# Patient Record
Sex: Female | Born: 1960 | Race: White | Hispanic: No | Marital: Married | Smoking: Never smoker
Health system: Southern US, Community
[De-identification: ages and names within clinical notes are randomized; demographics above are authoritative.]

## PROBLEM LIST (undated history)

## (undated) DIAGNOSIS — F329 Major depressive disorder, single episode, unspecified: Secondary | ICD-10-CM

## (undated) DIAGNOSIS — Z87442 Personal history of urinary calculi: Secondary | ICD-10-CM

## (undated) DIAGNOSIS — C801 Malignant (primary) neoplasm, unspecified: Secondary | ICD-10-CM

## (undated) DIAGNOSIS — F419 Anxiety disorder, unspecified: Secondary | ICD-10-CM

## (undated) DIAGNOSIS — R87619 Unspecified abnormal cytological findings in specimens from cervix uteri: Secondary | ICD-10-CM

## (undated) DIAGNOSIS — F32A Depression, unspecified: Secondary | ICD-10-CM

## (undated) HISTORY — DX: Major depressive disorder, single episode, unspecified: F32.9

## (undated) HISTORY — DX: Depression, unspecified: F32.A

## (undated) HISTORY — DX: Personal history of urinary calculi: Z87.442

## (undated) HISTORY — DX: Unspecified abnormal cytological findings in specimens from cervix uteri: R87.619

## (undated) HISTORY — DX: Anxiety disorder, unspecified: F41.9

---

## 2004-11-15 ENCOUNTER — Ambulatory Visit: Payer: Self-pay | Admitting: Family Medicine

## 2006-04-14 ENCOUNTER — Emergency Department: Payer: Self-pay | Admitting: Emergency Medicine

## 2006-04-15 ENCOUNTER — Ambulatory Visit: Payer: Self-pay | Admitting: Emergency Medicine

## 2006-07-23 ENCOUNTER — Ambulatory Visit: Payer: Self-pay | Admitting: Family Medicine

## 2006-08-27 ENCOUNTER — Ambulatory Visit: Payer: Self-pay | Admitting: Gastroenterology

## 2006-09-26 ENCOUNTER — Ambulatory Visit: Payer: Self-pay | Admitting: Gastroenterology

## 2006-10-13 ENCOUNTER — Emergency Department: Payer: Self-pay

## 2006-10-22 ENCOUNTER — Ambulatory Visit: Payer: Self-pay | Admitting: Gastroenterology

## 2007-06-17 ENCOUNTER — Ambulatory Visit: Payer: Self-pay

## 2007-06-30 ENCOUNTER — Ambulatory Visit: Payer: Self-pay | Admitting: Ophthalmology

## 2008-01-20 ENCOUNTER — Ambulatory Visit: Payer: Self-pay

## 2008-05-18 ENCOUNTER — Ambulatory Visit: Payer: Self-pay | Admitting: Unknown Physician Specialty

## 2008-05-31 ENCOUNTER — Ambulatory Visit: Payer: Self-pay | Admitting: Unknown Physician Specialty

## 2012-09-23 HISTORY — PX: BASAL CELL CARCINOMA EXCISION: SHX1214

## 2012-09-23 HISTORY — PX: SQUAMOUS CELL CARCINOMA EXCISION: SHX2433

## 2013-08-16 ENCOUNTER — Ambulatory Visit: Payer: Self-pay | Admitting: Gastroenterology

## 2013-08-16 LAB — HM COLONOSCOPY

## 2013-09-23 HISTORY — PX: MOHS SURGERY: SHX181

## 2013-09-29 ENCOUNTER — Ambulatory Visit: Payer: Self-pay | Admitting: Family Medicine

## 2014-03-03 DIAGNOSIS — E669 Obesity, unspecified: Secondary | ICD-10-CM | POA: Insufficient documentation

## 2014-03-03 DIAGNOSIS — G479 Sleep disorder, unspecified: Secondary | ICD-10-CM | POA: Insufficient documentation

## 2014-03-03 DIAGNOSIS — Z6835 Body mass index (BMI) 35.0-35.9, adult: Secondary | ICD-10-CM | POA: Insufficient documentation

## 2014-09-01 ENCOUNTER — Ambulatory Visit: Payer: Self-pay | Admitting: Sports Medicine

## 2014-09-30 LAB — TSH: TSH: 1.2 u[IU]/mL (ref 0.41–5.90)

## 2014-09-30 LAB — CBC AND DIFFERENTIAL
HCT: 40 % (ref 36–46)
HEMOGLOBIN: 13.5 g/dL (ref 12.0–16.0)
PLATELETS: 348 10*3/uL (ref 150–399)
WBC: 9.3 10^3/mL

## 2014-09-30 LAB — HEPATIC FUNCTION PANEL
ALT: 11 U/L (ref 7–35)
AST: 18 U/L (ref 13–35)

## 2014-09-30 LAB — LIPID PANEL
CHOLESTEROL: 205 mg/dL — AB (ref 0–200)
HDL: 53 mg/dL (ref 35–70)
LDL Cholesterol: 132 mg/dL
Triglycerides: 100 mg/dL (ref 40–160)

## 2014-09-30 LAB — BASIC METABOLIC PANEL
Creatinine: 0.7 mg/dL (ref 0.5–1.1)
Glucose: 80 mg/dL
POTASSIUM: 4.4 mmol/L (ref 3.4–5.3)
Sodium: 139 mmol/L (ref 137–147)

## 2014-10-12 ENCOUNTER — Ambulatory Visit (INDEPENDENT_AMBULATORY_CARE_PROVIDER_SITE_OTHER): Payer: No Typology Code available for payment source | Admitting: Sports Medicine

## 2014-10-12 ENCOUNTER — Encounter: Payer: Self-pay | Admitting: Sports Medicine

## 2014-10-12 VITALS — BP 106/62 | HR 93 | Ht 65.0 in | Wt 207.0 lb

## 2014-10-12 DIAGNOSIS — M7661 Achilles tendinitis, right leg: Secondary | ICD-10-CM | POA: Insufficient documentation

## 2014-10-12 DIAGNOSIS — M67879 Other specified disorders of synovium and tendon, unspecified ankle and foot: Secondary | ICD-10-CM

## 2014-10-12 DIAGNOSIS — M7662 Achilles tendinitis, left leg: Secondary | ICD-10-CM

## 2014-10-12 NOTE — Assessment & Plan Note (Addendum)
Ultrasound findings consistent with chronic moderate bilateral achilles tendinopathy. The degree of thickening at the nodules is 70 enlarged over normal Max diameter.  Recommendations: 1) Continue meloxicam/tramadol as needed for pain.  2) Heel raise exercises: Raise both feet up, then lower one foot down at a time. Perform exercises with legs straight then with knees bent.  - Start with 3 sets of 8 reps (8 reps for left calf, 8 reps for right calf)  - After 1 week increase to 3 sets of 10 reps.   - After 2 weeks increase to 3 sets of 12 reps  - After 3 weeks increase to 3 sets of 15 reps.   - Pain should never be severe - it should only be mild.  3) Body Helix for R ankle during activity / standing. Will assess benefit at follow up and consider body helix for L ankle.  4) Wear heel lifts in your shoes daily.  5) Rub arnica gel on your achilles 2 - 3x daily.  6) Return to clinic in 6 weeks. Talk to Dr. Noemi Chapel about whether he wants to follow up with you of would like to see results of conservative approach.

## 2014-10-12 NOTE — Progress Notes (Signed)
Subjective:    Patient ID: Krystal Simpson, female    DOB: [birth year], 54 y.o.   MRN: [id number]  HPI Krystal Simpson is a 54 year old with PMH of depression/anxiety who presents today as a referral from Dr. Noemi Chapel for evaluation/treatment of chronic bilateral achilles pain. Krystal Simpson's achilles pain began after receiving ciprofloxacin for an uncomplicated UTI a year and half ago. Soon after finishing her week course of cipro, she began to develop pain in both of her achilles (L>R). She went to her PCP who referred to her to an orthopaedic (Dr. [name] Heck in [place]). Dr. [name] Heck put her in a walking boot on her left foot for 6 - 9 weeks followed by an ankle brace. Once in the ankle brace, she began participating in physical therapy without much relief. She was then referred to an orthopaedic in [place] (Dr. Diona Browner, a foot/ankle specialist, at [facility]) who performed an MRI and recommended surgery for both achilles. [name] wanted to avoid surgery and was finally referred to Dr. Noemi Chapel. Dr. Noemi Chapel started Krystal Simpson on tramadol and a short course of prednisone. Krystal Simpson believes the prednisone helped reduce the pain slightly in her achilles. Tramadol helps relieve her pain.  Krystal Simpson currently takes Meloxicam daily and tramadol at night for her achilles pain. She reports some relief with these medications but not enough to allow her to walk with comfort for ADLs. Her achilles pain is currently worse in her R, although initially the pain was concentrated in her L achilles. The pain is a sharp throbbing pain that is constant in both ankles/heels. She reports lifting her toes up and stretching her achilles provide temporary relief. She was given heel lifts for her shoes that she wears most days and they help.  She denies any numbness or tingling into her feet. She denies any trauma or injuries to her feet. She desires to be able to walk her dog without discomfort and return to  working.  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   Review of Systems     Objective:   Physical Exam  Obese. Well developed. Pleasant middle aged woman in no acute distress.  BP 106/62 mmHg  Pulse 93  Ht 5\' 5"  (1.651 m)  Wt 207 lb (93.895 kg)  BMI 34.45 kg/m2  Right ankle: Nodule present on back of achilles. Nodule starts about 3 cms above calcaneus. Achilles tendon tender to palpation diffusely. Full ankle ROM. 5/5 strength with ankle inversion, eversion, dorsalflexion and plantarflexion.   Left ankle: Nodule present on back of achilles. Starts 4 cm above calcaneus.  Achilles tendon tender to palpation (less tender than her R tendon). Full ankle ROM. 5/5 strength with ankle inversion, eversion, dorsalflexion and plantarflexion.   Feet: Splaying of great toe from second toe bilaterally. Splaying of 2nd and 3rd toes present on left foot. Mild loss of longitudinal arch bilaterally.  Mild pronation with gait.     Ultrasound: Findings consistent with moderate bilateral chronic achilles tendiopathy. Right achilles thickness  At insertion is 0.42 and thickened to 0.97 at mid portion of nodule.  On transverse scan there is some hypoechoic disruption but no tear noted.  . Left achilles thickness  At insertion is 0.44 and at midpoint of nodule is 0.93.  Transverse scan shows mild hypoechoic change mid tendon.  Neither tendon shows neovessels and neither shows a tear at this point.        Assessment &  Plan:   Note written originally by [place]. Note reviewed/edited by Dr. Stefanie Libel.

## 2014-10-12 NOTE — Patient Instructions (Signed)
Heel raises on both feet simultaneously then lower down one foot at a time start with 3 sets of 8 reps initially (8 for left, 8 for right for each set). After 1 week increase to 3 sets of 10 reps. After 2 weeks increase to 3 sets of 12 reps. After 3 weeks increase to 3 sets of 15.   Perform the above exercises with legs initially straight then with knees bent.   Please keep pain mild during exercises. If pain is more severe, cut down on amount of exercise.   Wear compression sleeve on R ankle during activity (walking) / standing. If you have benefits with the right ankle we can get you a sleeve for your left ankle. You do not need to wear the compression sleeve while sleeping or sitting around.   Wear your heel lifts in your shoes daily.    Rub arnica gel on your achilles 2 - 3x daily.    Return to clinic in 6 weeks.

## 2014-10-18 ENCOUNTER — Ambulatory Visit: Payer: Self-pay | Admitting: Family Medicine

## 2014-10-26 ENCOUNTER — Ambulatory Visit: Payer: Self-pay | Admitting: Family Medicine

## 2014-11-23 ENCOUNTER — Ambulatory Visit (INDEPENDENT_AMBULATORY_CARE_PROVIDER_SITE_OTHER): Payer: No Typology Code available for payment source | Admitting: Sports Medicine

## 2014-11-23 ENCOUNTER — Encounter: Payer: Self-pay | Admitting: Sports Medicine

## 2014-11-23 VITALS — BP 105/66 | HR 101 | Ht 65.0 in | Wt 207.0 lb

## 2014-11-23 DIAGNOSIS — M7662 Achilles tendinitis, left leg: Secondary | ICD-10-CM

## 2014-11-23 DIAGNOSIS — M7661 Achilles tendinitis, right leg: Secondary | ICD-10-CM

## 2014-11-23 NOTE — Patient Instructions (Signed)
-  Discontinue gabapentin -Discontinue Arnica gel -Start Aspercreme 2-3 times daily to both Achilles tendons -Body helix compression sleeve fitted for the other ankle -Heel lifts with both feet only with step only at about book height, as many repeats as he can do while keeping her pain below a 3. To this 2-3 times daily. -We will check it back in 6-8 weeks or sooner if needed.

## 2014-11-23 NOTE — Assessment & Plan Note (Signed)
-  Discontinue gabapentin -Discontinue Arnica gel -Start Aspercreme 2-3 times daily to both Achilles tendons -Body helix compression sleeve fitted for the left ankle -Heel lifts with both feet only with step only at about book height, as many repeats as he can do while keeping her pain below a 3. Do this 2-3 times daily. -If having significant pain, then patient said she will contact her PCP. If she calls and needs a medication, a short-course of Vicodin would not be unreasonable to provide. -We will check it back in 6-8 weeks or sooner if needed.

## 2014-11-23 NOTE — Progress Notes (Signed)
Subjective:    Patient ID: Krystal Simpson, female    DOB: [birth year], 54 y.o.   MRN: [id number]  HPI Krystal Simpson is a 54 year old female who presents for follow-up of bilateral Achilles tendinitis. At our last visit we added a right body helix compression sleeve, which has helped. She was doing a home exercise program, but found difficulty with single heel raises on the right compared to the left. She has tried tramadol without relief. Her primary care physician started her on gabapentin 300 mg titrated to 3 times daily. She has been taking this for 2 weeks without any significant improvement in pain. She has bilateral heel lifts in her shoes. She also tried meloxicam. Her past medical history is complicated by depression and anxiety, which she is taking nortriptyline for migraine prophylaxis, Cymbalta, Zoloft, clonazepam, and trazodone as needed for sleep. Symptoms today are aggravated with walking. She denies any significantly worsening mood, but her pain has been frustrating for her.  Recall that she initially was seen on 1/20 as a referral from Dr. Noemi Chapel for evaluation/treatment of chronic bilateral achilles pain. Krystal Simpson's achilles pain began after receiving ciprofloxacin for an uncomplicated UTI a year and half ago. Soon after finishing her week course of cipro, she began to develop pain in both of her achilles (L>R). She went to her PCP who referred to her to an orthopaedic (Dr. [name] Heck in [place]). Dr. [name] Heck put her in a walking boot on her left foot for 6 - 9 weeks followed by an ankle brace. Once in the ankle brace, she began participating in physical therapy without much relief. She was then referred to an orthopaedic in [place] (Dr. Diona Browner, a foot/ankle specialist, at [facility]) who performed an MRI and recommended surgery for both achilles. Krystal Simpson wanted to avoid surgery and was finally referred to Dr. Noemi Chapel. Dr. Noemi Chapel started Krystal Simpson on tramadol and a  short course of prednisone. Krystal Simpson believes the prednisone helped reduce the pain slightly in her achilles.   Past medical, social, medications, and allergies were reviewed and are up-to-date in the chart. Review of Systems A 7 system review was performed is otherwise negative unless noted in the history of present illness.    Objective:   Physical Exam BP 105/66 mmHg  Pulse 101  Ht 5\' 5"  (1.651 m)  Wt 207 lb (93.895 kg)  BMI 34.45 kg/m2 GEN: The patient is well-developed well-nourished female and in no acute distress.  She is awake alert and oriented x3. SKIN: warm and well-perfused, no rash  EXTR: No lower extremity edema or calf tenderness Neuro: Strength 5/5 globally. Sensation intact throughout. No focal deficits. Vasc: +2 bilateral distal pulses. No edema.  MSK: Examination of the bilateral feet reveals palpably intact bilateral Achilles tendons. Mid substance nodular thickening palpated bilaterally. Fairly intact strength with plantar flexion bilaterally. Pain is reproduced with resisted plantar flexion and with passive dorsiflexion bilaterally.  Limited musculoskeletal ultrasound: Long and short axis views were obtained of the bilateral Achilles tendons. Sonographically bilateral Achilles tendons are intact. The right Achilles thickness at its insertion is 0.49 and 0.9 at the mid substance thickening, which is relatively unchanged from previous exam. There are some neo-vessels seen in the right Achilles nodular thickening. There is some localized hypoechoic change in the right Achilles on the long and transverse views. The left Achilles measures 1.03 cm at the point of greatest thickening at the mid substance. Unlike the right Achilles, there do not appear  to be significant neo-vessel formation. Bilaterally, there is increased blood flow surrounding both Achilles tendons. Significant tears in the bilateral Achilles tendons are not seen.   Assessment & Plan:  Please see problem  based assessment and plan in the problem list.

## 2015-01-18 ENCOUNTER — Ambulatory Visit: Payer: No Typology Code available for payment source | Admitting: Sports Medicine

## 2015-02-15 ENCOUNTER — Encounter: Payer: Self-pay | Admitting: Sports Medicine

## 2015-02-15 ENCOUNTER — Ambulatory Visit (INDEPENDENT_AMBULATORY_CARE_PROVIDER_SITE_OTHER): Payer: No Typology Code available for payment source | Admitting: Sports Medicine

## 2015-02-15 VITALS — BP 127/82 | Ht 65.0 in | Wt 199.0 lb

## 2015-02-15 DIAGNOSIS — M7661 Achilles tendinitis, right leg: Secondary | ICD-10-CM | POA: Diagnosis not present

## 2015-02-15 DIAGNOSIS — M7662 Achilles tendinitis, left leg: Secondary | ICD-10-CM | POA: Diagnosis not present

## 2015-02-15 NOTE — Progress Notes (Signed)
Patient ID: STORY CONTI, female   DOB: 08-24-1961, 54 y.o.   MRN: [id number]  Bilat AT tendinopathy that started about 1 year ago after using Cipro The left is less painful RT is still very painful  Uses Ankle compression sleeves Heel cups Soft back of heel shoes  Unable to sue NTG as she has migraines  Does exercises but with some pain  Marked swelling in both AT with nodules has been noted on both prior exams  EXAM NAD BP 127/82 mmHg  Ht 5\' 5"  (1.651 m)  Wt 199 lb (90.266 kg)  BMI 33.12 kg/m2  RT AT with very tender nodule 2 cm above calcaneus Lt AT has smaller nodule and less TTP  Good ankle dorsiflexion  Walks with antalgic limp favoring RT  [place] Rt AT nodule at 2 to 4 cms above calcaneus is 0.94 cms Hypoechoic changes throughout nodule on transv view Markedly increased doppoler flow and neovessels At insertion AT is only 0.49 cms  Lt AT Nodule at 2 to 4 cms above calcaneus O.77 cms with normal appearance on long and trans view No inc doppler actiivty 0.46 cm at insertion Much improved

## 2015-02-15 NOTE — Assessment & Plan Note (Signed)
Left is responding to conservative care  Rt is still very inflamed and only minimal improvement in thickness of nodule  Keep up exercises as tolerated Compression sleeves  I added sports insoles with heel lifts She felt some good pain relief with these  Reck and [place] in 2 mos

## 2015-02-28 ENCOUNTER — Telehealth: Payer: Self-pay | Admitting: Family Medicine

## 2015-02-28 DIAGNOSIS — R928 Other abnormal and inconclusive findings on diagnostic imaging of breast: Secondary | ICD-10-CM

## 2015-02-28 NOTE — Telephone Encounter (Signed)
Please place order Thanks

## 2015-02-28 NOTE — Telephone Encounter (Signed)
Pt stated that [facility] told pt she needed to have a 6 month recheck on her left breast. Pt stated that she had her last mammogram done in December 2015 and they need an order for a diagnostic mammogram with ultrasound of her left breast.  Thanks TNP

## 2015-03-01 ENCOUNTER — Telehealth: Payer: Self-pay | Admitting: Family Medicine

## 2015-03-01 DIAGNOSIS — R928 Other abnormal and inconclusive findings on diagnostic imaging of breast: Secondary | ICD-10-CM | POA: Insufficient documentation

## 2015-03-01 NOTE — Telephone Encounter (Signed)
Pt advised.   Thanks,   -Laura  

## 2015-03-01 NOTE — Telephone Encounter (Signed)
Mammogram appointment is 04/27/2015 at 1:20pm LMTCB 03/01/2015  Thanks,   -Mickel Baas

## 2015-03-01 NOTE — Addendum Note (Signed)
Addended by: Ashley Royalty E on: 03/01/2015 02:24 PM   Modules accepted: Orders

## 2015-03-08 ENCOUNTER — Telehealth: Payer: Self-pay | Admitting: Family Medicine

## 2015-03-08 DIAGNOSIS — M7662 Achilles tendinitis, left leg: Principal | ICD-10-CM

## 2015-03-08 DIAGNOSIS — M7661 Achilles tendinitis, right leg: Secondary | ICD-10-CM

## 2015-03-08 MED ORDER — HYDROCODONE-ACETAMINOPHEN 10-325 MG PO TABS: 1.0000 | ORAL_TABLET | Freq: Four times a day (QID) | ORAL | Status: DC | PRN

## 2015-03-08 NOTE — Telephone Encounter (Signed)
Pt contacted office for refill request on the following medications: HYDROCODON-ACETAMINOPHEN 5-325.  Pt is requesting a Rx for 50 pills.  Pt states the last Rx was for 150 pills but due to insurance coverage only 100 pills were covered.  Pt states the pharmacy states pt would need a new Rx for the add'l 50 pills. HB#[phone]/BO

## 2015-03-08 NOTE — Telephone Encounter (Signed)
Reminded patient addictive. Did not get 10 as thought last month. Will write for higher dose and see how she does. Thanks.  Marland Kitchen

## 2015-04-13 ENCOUNTER — Ambulatory Visit (INDEPENDENT_AMBULATORY_CARE_PROVIDER_SITE_OTHER): Payer: No Typology Code available for payment source | Admitting: Sports Medicine

## 2015-04-13 ENCOUNTER — Encounter: Payer: Self-pay | Admitting: Sports Medicine

## 2015-04-13 VITALS — BP 114/68 | HR 87 | Ht 65.0 in | Wt 199.0 lb

## 2015-04-13 DIAGNOSIS — M7661 Achilles tendinitis, right leg: Secondary | ICD-10-CM | POA: Diagnosis not present

## 2015-04-13 DIAGNOSIS — M7662 Achilles tendinitis, left leg: Secondary | ICD-10-CM | POA: Diagnosis not present

## 2015-04-13 NOTE — Progress Notes (Signed)
Subjective:     Patient ID: Krystal Simpson, female   DOB: [birth year], 54 y.o.   MRN: [id number]  HPI Krystal Simpson is a 54 yo female here for follow up of bilateral achilles tendinopathy. She has had chronic bilateral achilles pain that started soon after receiving ciprofloxacin 2 years go. Her left achilles is less painful. Her right achilles continues to be very painful. She is able to unable to walk without pain. She is able to do 2 sets of 10 calf raises before her right achilles begins to hurt.   This is a significant improvement.  She is using helix ankle compression sleeves daily. Last time we added sports insoles with heel lifts which gave her some pain relief. She is wearing shoes with soft heel counter.  Patient has lost 30 lbs over the past several months. She has recently started to have intermittent pain down the front of her thigh that shoots down her leg and is worried that this could be from changing her gait due to achilles pain.   Review of Systems Per HPI.    Objective:   Physical Exam Gen: Pleasant woman, NAD BP 114/68 mmHg  Pulse 87  Ht 5\' 5"  (1.651 m)  Wt 199 lb (90.266 kg)  BMI 33.12 kg/m2  MSK: Right ankle: Right achilles tendon has a nodule about 2 cm above calcaneus. Tenderness to palpation over nodule.  Full ankle ROM, pain with ankle dorsiflexion. Good strength with ankle inversion, eversion, dorsiflexion, plantarflexion.  Left ankle: Left achilles has smaller, more distinct nodule 2 to 4 cm above calcaneus.  No tenderness to palpation over achilles..  Full ankle ROM. Good strength with ankle inversion, eversion, dorsiflexion, plantarflexion.  Weight bearing foot exam: Splaying of 2nd and 3rd digits present bilaterally. Loss of transverse arch. Mild loss of longitudinal arch bilaterally. No antalgic gait.  U/S: R achilles: Nodule at 2-4 cm above calcaneus is 0.74 cm (decreased from 0.94 cm last time). Increased doppler flow noted in nodule and  peritendionous. At insertion, tendon is 0.49 cm. Still hypoechoic change in superficial fibers. L achilles: 0.74 cm nodule about 2-4 cm above calcaneus. No abnormal doppler flow. .  Tendon is 0.47 cm at insertion.    Assessment:     See problem focused A/P.     Plan:     See problem focused A/P.

## 2015-04-13 NOTE — Assessment & Plan Note (Addendum)
Left continues to respond to conservative care. Right is still inflamed with some improvement in thickness of nodule. Continue exercises, use compression sleeves. Patient given sports insoles with added heel lifts for new soft-backed shoes.  This is gradually remodeling and I suspect will improve over next year at a gradual rate.  Follow up in 2 months.

## 2015-04-20 ENCOUNTER — Other Ambulatory Visit: Payer: Self-pay | Admitting: Family Medicine

## 2015-04-20 DIAGNOSIS — M7661 Achilles tendinitis, right leg: Secondary | ICD-10-CM

## 2015-04-20 DIAGNOSIS — M7662 Achilles tendinitis, left leg: Principal | ICD-10-CM

## 2015-04-20 MED ORDER — HYDROCODONE-ACETAMINOPHEN 10-325 MG PO TABS: 1.0000 | ORAL_TABLET | Freq: Four times a day (QID) | ORAL | Status: DC | PRN

## 2015-04-20 NOTE — Telephone Encounter (Signed)
Pt contacted office for refill request on the following medications: HYDROcodone-acetaminophen (NORCO) 10-325 MG per tablet. Pt would like to pick up the RX Monday 04/24/15. Thanks TNP

## 2015-04-20 NOTE — Telephone Encounter (Signed)
Printed.  Please notify patient. Thanks.  

## 2015-04-27 ENCOUNTER — Ambulatory Visit
Admission: RE | Admit: 2015-04-27 | Discharge: 2015-04-27 | Disposition: A | Discharge status: Home / Self Care | Payer: No Typology Code available for payment source | Source: Ambulatory Visit | Attending: Family Medicine | Admitting: Family Medicine

## 2015-04-27 DIAGNOSIS — R928 Other abnormal and inconclusive findings on diagnostic imaging of breast: Secondary | ICD-10-CM | POA: Diagnosis present

## 2015-04-27 DIAGNOSIS — Z09 Encounter for follow-up examination after completed treatment for conditions other than malignant neoplasm: Secondary | ICD-10-CM | POA: Insufficient documentation

## 2015-04-27 DIAGNOSIS — N6002 Solitary cyst of left breast: Secondary | ICD-10-CM | POA: Diagnosis not present

## 2015-04-27 HISTORY — DX: Malignant (primary) neoplasm, unspecified: C80.1

## 2015-05-31 ENCOUNTER — Telehealth: Payer: Self-pay | Admitting: Family Medicine

## 2015-05-31 NOTE — Telephone Encounter (Signed)
Ok to put in same day. Thanks.   

## 2015-05-31 NOTE — Telephone Encounter (Signed)
Pt stated that she has dizzy spells off and on lasting less than a minute and usually happens when she moves fast. Pt also feels fatigue. Can I out her in a day slot tomorrow? Dr. Sharyon Medicus next open appt in 06/06/15. Please advise. Thanks TNP

## 2015-06-01 ENCOUNTER — Ambulatory Visit (INDEPENDENT_AMBULATORY_CARE_PROVIDER_SITE_OTHER): Payer: No Typology Code available for payment source | Admitting: Family Medicine

## 2015-06-01 ENCOUNTER — Encounter: Payer: Self-pay | Admitting: Family Medicine

## 2015-06-01 VITALS — BP 110/70 | HR 60 | Temp 97.9°F | Resp 16 | Ht 64.5 in | Wt 154.0 lb

## 2015-06-01 DIAGNOSIS — F32A Depression, unspecified: Secondary | ICD-10-CM | POA: Insufficient documentation

## 2015-06-01 DIAGNOSIS — H6980 Other specified disorders of Eustachian tube, unspecified ear: Secondary | ICD-10-CM | POA: Diagnosis not present

## 2015-06-01 DIAGNOSIS — M7660 Achilles tendinitis, unspecified leg: Secondary | ICD-10-CM | POA: Insufficient documentation

## 2015-06-01 DIAGNOSIS — R7989 Other specified abnormal findings of blood chemistry: Secondary | ICD-10-CM | POA: Insufficient documentation

## 2015-06-01 DIAGNOSIS — H8110 Benign paroxysmal vertigo, unspecified ear: Secondary | ICD-10-CM

## 2015-06-01 DIAGNOSIS — M7661 Achilles tendinitis, right leg: Secondary | ICD-10-CM | POA: Diagnosis not present

## 2015-06-01 DIAGNOSIS — R195 Other fecal abnormalities: Secondary | ICD-10-CM | POA: Insufficient documentation

## 2015-06-01 DIAGNOSIS — K219 Gastro-esophageal reflux disease without esophagitis: Secondary | ICD-10-CM | POA: Insufficient documentation

## 2015-06-01 DIAGNOSIS — C4491 Basal cell carcinoma of skin, unspecified: Secondary | ICD-10-CM | POA: Insufficient documentation

## 2015-06-01 DIAGNOSIS — M7662 Achilles tendinitis, left leg: Secondary | ICD-10-CM | POA: Diagnosis not present

## 2015-06-01 DIAGNOSIS — R5383 Other fatigue: Secondary | ICD-10-CM

## 2015-06-01 DIAGNOSIS — G43909 Migraine, unspecified, not intractable, without status migrainosus: Secondary | ICD-10-CM | POA: Insufficient documentation

## 2015-06-01 DIAGNOSIS — F329 Major depressive disorder, single episode, unspecified: Secondary | ICD-10-CM | POA: Insufficient documentation

## 2015-06-01 DIAGNOSIS — F419 Anxiety disorder, unspecified: Secondary | ICD-10-CM | POA: Insufficient documentation

## 2015-06-01 DIAGNOSIS — K635 Polyp of colon: Secondary | ICD-10-CM | POA: Insufficient documentation

## 2015-06-01 DIAGNOSIS — E669 Obesity, unspecified: Secondary | ICD-10-CM | POA: Insufficient documentation

## 2015-06-01 DIAGNOSIS — D649 Anemia, unspecified: Secondary | ICD-10-CM | POA: Insufficient documentation

## 2015-06-01 DIAGNOSIS — IMO0002 Reserved for concepts with insufficient information to code with codable children: Secondary | ICD-10-CM | POA: Insufficient documentation

## 2015-06-01 DIAGNOSIS — E7800 Pure hypercholesterolemia, unspecified: Secondary | ICD-10-CM | POA: Insufficient documentation

## 2015-06-01 DIAGNOSIS — E559 Vitamin D deficiency, unspecified: Secondary | ICD-10-CM | POA: Insufficient documentation

## 2015-06-01 MED ORDER — HYDROCODONE-ACETAMINOPHEN 10-325 MG PO TABS: 1.0000 | ORAL_TABLET | Freq: Four times a day (QID) | ORAL | Status: DC | PRN

## 2015-06-01 MED ORDER — TRIAMCINOLONE ACETONIDE 55 MCG/ACT NA AERO: 2.0000 | INHALATION_SPRAY | Freq: Every day | NASAL | Status: DC

## 2015-06-01 NOTE — Patient Instructions (Signed)
Benign Positional Vertigo Vertigo means you feel like you or your surroundings are moving when they are not. Benign positional vertigo is the most common form of vertigo. Benign means that the cause of your condition is not serious. Benign positional vertigo is more common in older adults. CAUSES  Benign positional vertigo is the result of an upset in the labyrinth system. This is an area in the middle ear that helps control your balance. This may be caused by a viral infection, head injury, or repetitive motion. However, often no specific cause is found. SYMPTOMS  Symptoms of benign positional vertigo occur when you move your head or eyes in different directions. Some of the symptoms may include:  Loss of balance and falls.  Vomiting.  Blurred vision.  Dizziness.  Nausea.  Involuntary eye movements (nystagmus). DIAGNOSIS  Benign positional vertigo is usually diagnosed by physical exam. If the specific cause of your benign positional vertigo is unknown, your caregiver may perform imaging tests, such as magnetic resonance imaging (MRI) or computed tomography (CT). TREATMENT  Your caregiver may recommend movements or procedures to correct the benign positional vertigo. Medicines such as meclizine, benzodiazepines, and medicines for nausea may be used to treat your symptoms. In rare cases, if your symptoms are caused by certain conditions that affect the inner ear, you may need surgery. HOME CARE INSTRUCTIONS   Follow your caregiver's instructions.  Move slowly. Do not make sudden body or head movements.  Avoid driving.  Avoid operating heavy machinery.  Avoid performing any tasks that would be dangerous to you or others during a vertigo episode.  Drink enough fluids to keep your urine clear or pale yellow. SEEK IMMEDIATE MEDICAL CARE IF:   You develop problems with walking, weakness, numbness, or using your arms, hands, or legs.  You have difficulty speaking.  You develop  severe headaches.  Your nausea or vomiting continues or gets worse.  You develop visual changes.  Your family or friends notice any behavioral changes.  Your condition gets worse.  You have a fever.  You develop a stiff neck or sensitivity to light. MAKE SURE YOU:   Understand these instructions.  Will watch your condition.  Will get help right away if you are not doing well or get worse. Document Released: 06/17/2006 Document Revised: 12/02/2011 Document Reviewed: 05/30/2011 ExitCare Patient Information 2015 [employer]. This information is not intended to replace advice given to you by your health care provider. Make sure you discuss any questions you have with your health care provider.    

## 2015-06-01 NOTE — Progress Notes (Signed)
Patient: Krystal Simpson Female    DOB: 1961-05-27   54 y.o.   MRN: [id number] Visit Date: 06/01/2015  Today's Provider: Margarita Rana, MD   Chief Complaint  Patient presents with  . Dizziness   Subjective:    HPI Vertigo - Dizziness: Patient presents with dizziness .  The dizziness has been present for a few months, and worsening since surgery in 05/01/2015. The patient describes the symptoms as disequilibrium. 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   Also has dizziness with turning her head. Does not last long.  Happens with movement. Not all the time. Does have some congestion. Getting better. Just got rid of a cat.   Also with fatigue. Just tired. Takes a two hour nap in the day.  Is on a lot of medication. Does see psychiatry.     Allergies  Allergen Reactions  . Ciprofloxacin     Other reaction(s): Joint Pains  . Tetanus Toxoids Hives   Previous Medications   CHOLECALCIFEROL 1000 UNITS CAPSULE    Take 1,000 Units by mouth.    CLONAZEPAM (KLONOPIN) 0.5 MG TABLET    Take 0.5 mg by mouth 2 (two) times daily as needed.   CYANOCOBALAMIN PO    Take 1 tablet by mouth daily.    DULOXETINE (CYMBALTA) 60 MG CAPSULE    Take 60 mg by mouth daily.    FLAXSEED, LINSEED, (FLAX SEEDS PO)    Take 1 capsule by mouth daily.   HYDROCODONE-ACETAMINOPHEN (NORCO) 10-325 MG PER TABLET    Take 1 tablet by mouth every 6 (six) hours as needed.   MELOXICAM (MOBIC) 15 MG TABLET    Take 15 mg by mouth.    MULTIPLE VITAMINS-MINERALS PO    Take 1 tablet by mouth daily.    OMEGA-3 FATTY ACIDS (FISH OIL) 1200 MG CAPS    Take 2 capsules by mouth 2 (two) times daily.    PENNSAID 2 % SOLN       POLYETHYLENE GLYCOL POWDER (MIRALAX) POWDER    Take 1 Container by mouth once.   PROMETHAZINE (PHENERGAN) 25 MG SUPPOSITORY       PROMETHAZINE (PHENERGAN) 25 MG TABLET    TAKE 1 TABLET (25 MG TOTAL) BY MOUTH EVERY 6 (SIX) HOURS AS NEEDED FOR NAUSEA.   SERTRALINE (ZOLOFT) 100 MG TABLET    Take 100 mg by mouth 2 (two) times daily. Take one and a half tablet daily   SUMATRIPTAN (IMITREX) 100 MG TABLET    TAKE 1 AT HEADACHE ONSET, MAY REPEAT AFTER 2 HOURS, NO MORE THAN 2 IN A DAY,NO MORE THAN 4 IN A WEEK   TETRAHYDROZOLINE HCL (EYE DROPS OP)    Apply 1 drop to eye daily.   TOPIRAMATE (TOPAMAX) 25 MG TABLET    Take 25 mg by mouth daily.   TRAZODONE (DESYREL) 50 MG TABLET    50 mg at bedtime as needed.     Review of Systems  Constitutional: Positive for activity change.  HENT: Positive for congestion and tinnitus.   Eyes: Positive for pain.  Neurological: Positive for light-headedness.    Social History  Substance Use Topics  . Smoking status: Never Smoker   . Smokeless tobacco: Never Used  . Alcohol Use: No   Objective:   There were no vitals taken  for this visit.  Physical Exam  Constitutional: She is oriented to person, place, and time. She appears well-developed and well-nourished.  HENT:  Head: Normocephalic and atraumatic.  Left Ear: External ear normal.  Mouth/Throat: Oropharynx is clear and moist.  Skin graft on right ear.  Right TM with slight retraction.   Eyes: Conjunctivae and EOM are normal. Pupils are equal, round, and reactive to light.  Neck: Normal range of motion. Neck supple.  Cardiovascular: Normal rate and regular rhythm.   Pulmonary/Chest: Effort normal and breath sounds normal.  Neurological: She is alert and oriented to person, place, and time.  Psychiatric: She has a normal mood and affect. Her behavior is normal. Judgment and thought content normal.      Assessment & Plan:     1. Vertigo, benign paroxysmal, unspecified laterality Discussed diagnosis.  Gave handout and handout with exercises.  Patient instructed to call back if condition worsens or does not improve.     2.  Eustachian tube dysfunction, unspecified laterality Worsening. Will try medication and call if does not improve.  - triamcinolone (NASACORT) 55 MCG/ACT AERO nasal inhaler; Place 2 sprays into the nose daily.  Dispense: 1 Inhaler; Refill: 12  3. Other fatigue Worsening. Will check labs.  Recommended talk with psychiatry about decreasing her medication. She has lost a lot of weight.   - CBC with Differential/Platelet - TSH     Margarita Rana, MD  [place]

## 2015-06-01 NOTE — Telephone Encounter (Signed)
Pt is coming in at 145 this afternoon. Thanks TNP

## 2015-06-02 LAB — CBC WITH DIFFERENTIAL/PLATELET
BASOS ABS: 0 10*3/uL (ref 0.0–0.2)
Basos: 0 %
EOS (ABSOLUTE): 0.1 10*3/uL (ref 0.0–0.4)
Eos: 1 %
Hematocrit: 38.8 % (ref 34.0–46.6)
Hemoglobin: 13.1 g/dL (ref 11.1–15.9)
IMMATURE GRANS (ABS): 0 10*3/uL (ref 0.0–0.1)
IMMATURE GRANULOCYTES: 0 %
LYMPHS: 40 %
Lymphocytes Absolute: 3.3 10*3/uL — ABNORMAL HIGH (ref 0.7–3.1)
MCH: 30 pg (ref 26.6–33.0)
MCHC: 33.8 g/dL (ref 31.5–35.7)
MCV: 89 fL (ref 79–97)
MONOS ABS: 0.9 10*3/uL (ref 0.1–0.9)
Monocytes: 11 %
NEUTROS PCT: 48 %
Neutrophils Absolute: 3.9 10*3/uL (ref 1.4–7.0)
PLATELETS: 336 10*3/uL (ref 150–379)
RBC: 4.36 x10E6/uL (ref 3.77–5.28)
RDW: 14.8 % (ref 12.3–15.4)
WBC: 8.2 10*3/uL (ref 3.4–10.8)

## 2015-06-02 LAB — TSH: TSH: 0.86 u[IU]/mL (ref 0.450–4.500)

## 2015-06-05 ENCOUNTER — Other Ambulatory Visit: Payer: Self-pay | Admitting: Family Medicine

## 2015-06-05 ENCOUNTER — Telehealth: Payer: Self-pay

## 2015-06-05 NOTE — Telephone Encounter (Signed)
-----   Message from Margarita Rana, MD sent at 06/02/2015  9:29 AM EDT ----- Labs stable. Please notify patient. Thanks.

## 2015-06-05 NOTE — Telephone Encounter (Signed)
Pt advised.   Thanks,   -Jozlynn Plaia  

## 2015-06-05 NOTE — Telephone Encounter (Signed)
See refill request.

## 2015-06-14 ENCOUNTER — Encounter: Payer: Self-pay | Admitting: Family Medicine

## 2015-06-14 ENCOUNTER — Ambulatory Visit
Admission: RE | Admit: 2015-06-14 | Discharge: 2015-06-14 | Disposition: A | Discharge status: Home / Self Care | Payer: No Typology Code available for payment source | Source: Ambulatory Visit | Attending: Family Medicine | Admitting: Family Medicine

## 2015-06-14 ENCOUNTER — Ambulatory Visit (INDEPENDENT_AMBULATORY_CARE_PROVIDER_SITE_OTHER): Payer: No Typology Code available for payment source | Admitting: Family Medicine

## 2015-06-14 VITALS — BP 104/60 | HR 60 | Temp 98.0°F | Resp 16 | Wt 156.0 lb

## 2015-06-14 DIAGNOSIS — M5431 Sciatica, right side: Secondary | ICD-10-CM

## 2015-06-14 MED ORDER — MELOXICAM 15 MG PO TABS: 15.0000 mg | ORAL_TABLET | Freq: Every day | ORAL | Status: DC

## 2015-06-14 MED ORDER — CYCLOBENZAPRINE HCL 5 MG PO TABS: 5.0000 mg | ORAL_TABLET | Freq: Every day | ORAL | Status: DC

## 2015-06-14 NOTE — Progress Notes (Signed)
Subjective:    Patient ID: Krystal Simpson, female    DOB: [birth year], 54 y.o.   MRN: [id number]  Leg Pain  There was no injury mechanism. The pain is present in the right leg ("It feels like bone pain" x 3 months). The quality of the pain is described as aching. The pain is at a severity of 10/10 (current pain is a 0/10). The pain is severe. The pain has been worsening since onset. Associated symptoms include an inability to bear weight. Pertinent negatives include no loss of motion, loss of sensation, muscle weakness, numbness or tingling. She has tried NSAIDs (and prescription analgesics) for the symptoms. The treatment provided significant relief.  Pain is exacerbated by walking. Pt denies back pain. Pt is trying to lose weight, and is exercising some, but not strenuously due to achilles tendon pain. Pt notes crepitus. Does ride the bike. Pushes on a machine.   Reminds her of sciatica she has when she was pregnant.    Patient with tendonopathy of achilles.    Review of Systems  Constitutional: Negative for fever, chills, diaphoresis, activity change, appetite change, fatigue and unexpected weight change.  Musculoskeletal: Negative for myalgias, back pain, joint swelling and arthralgias.  Neurological: Negative for tingling and numbness.   BP 104/60 mmHg  Pulse 60  Temp(Src) 98 F (36.7 C) (Oral)  Resp 16  Wt 156 lb (70.761 kg)   Patient Active Problem List   Diagnosis Date Noted  . Abnormal thyroid stimulating hormone (TSH) level 06/01/2015  . Achilles bursitis 06/01/2015  . Absolute anemia 06/01/2015  . Anxiety 06/01/2015  . Basal cell carcinoma 06/01/2015  . Colon polyp 06/01/2015  . Clinical depression 06/01/2015  . Esophageal reflux 06/01/2015  . Headache, migraine 06/01/2015  . Adiposity 06/01/2015  . Hypercholesterolemia without hypertriglyceridemia 06/01/2015  . Squamous cell carcinoma 06/01/2015  . Fecal occult blood test positive 06/01/2015  . Avitaminosis D  06/01/2015  . Abnormal mammogram 03/01/2015  . Achilles tendonitis, bilateral 10/12/2014  . Disordered sleep 03/03/2014   Past Medical History  Diagnosis Date  . Cancer     skin  . Abnormal Pap smear of cervix   . Depression   . Anxiety    Current Outpatient Prescriptions on File Prior to Visit  Medication Sig  . Cholecalciferol 1000 UNITS capsule Take 1,000 Units by mouth.   . clonazePAM (KLONOPIN) 0.5 MG tablet Take 0.5 mg by mouth 2 (two) times daily as needed.  . CYANOCOBALAMIN PO Take 1 tablet by mouth daily.   . DULoxetine (CYMBALTA) 60 MG capsule Take 60 mg by mouth daily.   . Flaxseed, Linseed, (FLAX SEEDS PO) Take 1 capsule by mouth daily.  Marland Kitchen HYDROcodone-acetaminophen (NORCO) 10-325 MG per tablet Take 1 tablet by mouth every 6 (six) hours as needed.  . meloxicam (MOBIC) 15 MG tablet Take 15 mg by mouth.   . MULTIPLE VITAMINS-MINERALS PO Take 1 tablet by mouth daily.   . Omega-3 Fatty Acids (FISH OIL) 1200 MG CAPS Take 2 capsules by mouth 2 (two) times daily.   Marland Kitchen PENNSAID 2 % SOLN   . polyethylene glycol powder (MIRALAX) powder Take 1 Container by mouth once.  . promethazine (PHENERGAN) 25 MG suppository UNWRAP AND INSERT 1 SUPPOSITORY RECTALLY EVERY 6 HOURS AS NEEDED FOR NAUSEA  . promethazine (PHENERGAN) 25 MG tablet TAKE 1 TABLET (25 MG TOTAL) BY MOUTH EVERY 6 (SIX) HOURS AS NEEDED FOR NAUSEA.  Marland Kitchen sertraline (ZOLOFT) 100 MG tablet Take 100 mg by mouth 2 (  two) times daily. Take one and a half tablet daily  . SUMAtriptan (IMITREX) 100 MG tablet TAKE 1 AT HEADACHE ONSET, MAY REPEAT AFTER 2 HOURS, NO MORE THAN 2 IN A DAY,NO MORE THAN 4 IN A WEEK  . Tetrahydrozoline HCl (EYE DROPS OP) Apply 1 drop to eye daily.  Marland Kitchen topiramate (TOPAMAX) 25 MG tablet Take 25 mg by mouth daily.  . traZODone (DESYREL) 50 MG tablet 50 mg at bedtime as needed.   . triamcinolone (NASACORT) 55 MCG/ACT AERO nasal inhaler Place 2 sprays into the nose daily.   No current facility-administered medications  on file prior to visit.   Allergies  Allergen Reactions  . Ciprofloxacin     Other reaction(s): Joint Pains  . Tetanus Toxoids Hives   Past Surgical History  Procedure Laterality Date  . Basal cell carcinoma excision  2014  . Squamous cell carcinoma excision  2014   Social History   Social History  . Marital Status: Married    Spouse Name: N/A  . Number of Children: N/A  . Years of Education: N/A   Occupational History  . Not on file.   Social History Main Topics  . Smoking status: Never Smoker   . Smokeless tobacco: Never Used  . Alcohol Use: No  . Drug Use: No  . Sexual Activity: Not on file   Other Topics Concern  . Not on file   Social History Narrative   Family History  Problem Relation Age of Onset  . Emphysema Mother   . Lung cancer Father   . Healthy Sister   . Heart attack Maternal Grandmother        Objective:   Physical Exam  Constitutional: She is oriented to person, place, and time. She appears well-developed and well-nourished.  Musculoskeletal: Normal range of motion. She exhibits no edema or tenderness.  Straight leg raises negative.    Neurological: She is alert and oriented to person, place, and time.  Psychiatric: She has a normal mood and affect. Her behavior is normal. Judgment and thought content normal.   BP 104/60 mmHg  Pulse 60  Temp(Src) 98 F (36.7 C) (Oral)  Resp 16  Wt 156 lb (70.761 kg)        Assessment & Plan:  1. Sciatica of right side without back pain New problem. Worsening. Try medication, check Xray, adjust walking dog to no pulling, no leg presses and call if worsens or does not improve.  Consider round of prednisone or referral.   - cyclobenzaprine (FLEXERIL) 5 MG tablet; Take 1 tablet (5 mg total) by mouth at bedtime.  Dispense: 30 tablet; Refill: 1 - meloxicam (MOBIC) 15 MG tablet; Take 1 tablet (15 mg total) by mouth daily.  Dispense: 30 tablet; Refill: 3 - DG Lumbar Spine Complete; Future  Margarita Rana,  MD

## 2015-06-15 ENCOUNTER — Telehealth: Payer: Self-pay

## 2015-06-15 NOTE — Telephone Encounter (Signed)
Pt advised.   Thanks,   -Mckenna Gamm  

## 2015-06-15 NOTE — Telephone Encounter (Signed)
LMTCB 06/15/2015  Thanks,   -Mickel Baas

## 2015-06-15 NOTE — Telephone Encounter (Signed)
Pt is returning call.  CB#[phone]/MW

## 2015-06-15 NOTE — Telephone Encounter (Signed)
-----   Message from Margarita Rana, MD sent at 06/14/2015  4:49 PM EDT ----- Mild degenerative disc disease.  Otherwise normal. Thanks.

## 2015-06-22 ENCOUNTER — Telehealth: Payer: Self-pay | Admitting: Family Medicine

## 2015-06-22 NOTE — Telephone Encounter (Signed)
Low back pain with sciatica. Thanks.

## 2015-06-22 NOTE — Telephone Encounter (Signed)
Pt called wanting to know what the diagnosis was for her last visit on Wednesday.  She had an xray and the nurse called her back but she can not remember what she said.  CB#  [phone]  Hoyt Koch

## 2015-06-27 ENCOUNTER — Ambulatory Visit (INDEPENDENT_AMBULATORY_CARE_PROVIDER_SITE_OTHER): Payer: BLUE CROSS/BLUE SHIELD | Admitting: Sports Medicine

## 2015-06-27 ENCOUNTER — Encounter: Payer: Self-pay | Admitting: Sports Medicine

## 2015-06-27 VITALS — BP 95/61 | HR 61 | Ht 64.0 in | Wt 155.0 lb

## 2015-06-27 DIAGNOSIS — M545 Low back pain, unspecified: Secondary | ICD-10-CM | POA: Insufficient documentation

## 2015-06-27 DIAGNOSIS — M7661 Achilles tendinitis, right leg: Secondary | ICD-10-CM | POA: Diagnosis not present

## 2015-06-27 DIAGNOSIS — M7662 Achilles tendinitis, left leg: Secondary | ICD-10-CM

## 2015-06-27 NOTE — Progress Notes (Signed)
  HPI: [name] is a 54 y.o. presenting for follow up for bilateral achilles tendinopathy that started 1 year ago after using Cipro.  Today she states that her right tendon is worse and still tender; the left continues to feel better with very little pain.  She continues to use ankle compression sleeves, heel cups and soft back of heel inserts.  She states that she gets good relief from these.  She reports that she has been able to bike 30 minutes at time, perform calf stretches and leg extension exercises without issue.    Of note she states that she occasionally feels pain running down her lateral right leg to her knee and sometimes down to her foot.  She first noted this at her last visit in July and attributed this to possible changes in her gait secondary to achilles pain.  She since has had lumbar x-rays performed 2 weeks ago which showed mild degenerative changes of the lower facet joints of L4-5 and L5-S1.  For this her primary care doctor Dr. Venia Minks has prescribed Norco 10 mg and cyclobenzaprine 5 mg both of which have provided some relief of her symptoms.  Patient has continued to remain active and has lost an additional 45 lbs since July.   ROS per HPI Review of Systems  Constitutional: Negative.   Musculoskeletal: Positive for back pain. Negative for myalgias, joint pain and falls.  Skin: Negative.   Neurological: Positive for tingling and sensory change. Negative for dizziness, tremors, speech change, focal weakness and seizures.      Physical Exam: BP 95/61 mmHg  Pulse 61  Ht 5\' 4"  (1.626 m)  Wt 155 lb (70.308 kg)  BMI 26.59 kg/m2  Physical Exam  Constitutional: She appears well-developed and well-nourished.  Musculoskeletal: Normal range of motion.  Neurological: She has normal reflexes.  Inspection: Patellar tendon nodules present bilaterally R> L both located ~ 2 cm above the calcaneous Lower extremities: Hip flexion 5/5 BLE, knee extension/flexion 5/5 BLE,  ankle inversion 5/5 BL, eversion 5/5 L and 4/5 R, ankle dorsiflexion 5/5 BLE, ankle planarflexion 4/5 R and 5/5 L. Great toe dorsiflexion 5/5 BLE, Great toe plantarflexion 4/5 R and 5/5 L. ROM: full ROM of ankle  Sensation: intact in BLE Reflexes: Patellar and Achilles reflexes 2+ BL Gait: eversion of right foot, no antalgic gait   U/S: R achilles:No abnormal doppler flow; norm fibrillary pattern now noted w no calcifications.  AP now down to 0.70 vs 0.76 last visit. L achilles: No abnormal doppler flow; norm fibrillary pattern/ AP now down to 0.66 vs 0.74 Doppler flow now norm bilat  Assessment/Plan  [name] is a 54 y.o. presenting for follow up of bilateral achilles tendonopathy with worsening pain down right leg and physical exam findings and U/S consistent with slowly healing achilles tendons.  Her right tendon continues to be inflamed but inflammation and thickness of the nodules have decreased bilaterally since her last visit.   - Will increase cyclobenzaprine dose from 5 mg to 10 mg for the next couple of weeks. If back pain does not improve will have her follow up with her primary doctor Add some HEP for low back - Recommend only taking Norco on a PRN basis and only at night. - Gave the following low back exercises for her to complete at home: yoga breathing, Knee to chest, pelvic tilt, and knee to opposite shoulder 3 sets of 15 reps  3-4 times per week

## 2015-06-27 NOTE — Assessment & Plan Note (Signed)
Nice progress on both AT RT still withj sxs that are troublesome Keep up HEP Keep up support and ankle compression sleeve  Fitness program is really helping and keep this up  rescan in 3 mos/ reck

## 2015-06-27 NOTE — Patient Instructions (Signed)
Patient instructions - Increase cyclobenzaprine dose from 5 mg to 10 mg (muscle relaxant) for next couple of weeks. If back pain does not improve follow up with your primary doctor - Complete back exercises: yoga breathing, Knee to chest, pelvic tilt, and knee to opposite shoulder 3 sets of 15 reps  3-4 times per week - Lumbar (low back) X-ray findings:  -- FINDINGS: The lumbar vertebrae are in normal alignment. Intervertebral disc spaces appear normal. No compression deformity is seen. There is mild degenerative change of the facet joints of L4-5 and L5-S1. The SI joints are normally corticated.   IMPRESSION: Normal alignment. Normal intervertebral disc spaces. Mild degenerative change of the lower facet joints

## 2015-06-27 NOTE — Assessment & Plan Note (Signed)
This should respond to HEP Keep up fitness routine Add some flexion exercises for LBP  Increase dose of flexeril but limit narcotics

## 2015-07-04 ENCOUNTER — Other Ambulatory Visit: Payer: Self-pay | Admitting: Family Medicine

## 2015-07-04 DIAGNOSIS — M5431 Sciatica, right side: Secondary | ICD-10-CM

## 2015-07-04 MED ORDER — CYCLOBENZAPRINE HCL 5 MG PO TABS: 5.0000 mg | ORAL_TABLET | Freq: Every day | ORAL | Status: DC

## 2015-07-04 NOTE — Telephone Encounter (Signed)
Pt contacted office for refill request on the following medications: cyclobenzaprine (FLEXERIL) 5 MG tablet.  [facility].  CB#[phone]/MW  Pt is requesting the dosage changed to 2 at bedtime.  Pt states Dr Eden Lathe advised her to take 2 at bedtime/MW

## 2015-07-04 NOTE — Telephone Encounter (Signed)
Printed, please fax or call in to pharmacy. Thank you.   

## 2015-07-04 NOTE — Telephone Encounter (Signed)
[place] for patient to increase to 2 tablets at bedtime? Please advise. Thanks!

## 2015-07-13 ENCOUNTER — Other Ambulatory Visit: Payer: Self-pay

## 2015-07-13 DIAGNOSIS — M7661 Achilles tendinitis, right leg: Secondary | ICD-10-CM

## 2015-07-13 DIAGNOSIS — M7662 Achilles tendinitis, left leg: Principal | ICD-10-CM

## 2015-07-13 MED ORDER — HYDROCODONE-ACETAMINOPHEN 10-325 MG PO TABS: 1.0000 | ORAL_TABLET | Freq: Four times a day (QID) | ORAL | Status: DC | PRN

## 2015-07-13 NOTE — Telephone Encounter (Signed)
Prescription printed. Please notify patient it is ready for pick up. Thanks- Dr. Roxy Mastandrea.  

## 2015-07-17 ENCOUNTER — Other Ambulatory Visit: Payer: Self-pay | Admitting: Family Medicine

## 2015-07-17 DIAGNOSIS — M7662 Achilles tendinitis, left leg: Principal | ICD-10-CM

## 2015-07-17 DIAGNOSIS — M7661 Achilles tendinitis, right leg: Secondary | ICD-10-CM

## 2015-07-17 MED ORDER — HYDROCODONE-ACETAMINOPHEN 10-325 MG PO TABS: 1.0000 | ORAL_TABLET | Freq: Four times a day (QID) | ORAL | Status: DC | PRN

## 2015-07-17 NOTE — Telephone Encounter (Signed)
Done-aa 

## 2015-07-17 NOTE — Telephone Encounter (Signed)
Pt needs refill HYDROcodone-acetaminophen (NORCO) 10-325 MG tablet  Call back is [phone]  Thanks Con Memos

## 2015-07-17 NOTE — Telephone Encounter (Signed)
Ok to refill. Thanks 

## 2015-07-19 ENCOUNTER — Encounter: Payer: Self-pay | Admitting: Family Medicine

## 2015-07-19 ENCOUNTER — Ambulatory Visit (INDEPENDENT_AMBULATORY_CARE_PROVIDER_SITE_OTHER): Payer: BLUE CROSS/BLUE SHIELD | Admitting: Family Medicine

## 2015-07-19 VITALS — BP 98/60 | HR 76 | Temp 97.9°F | Resp 16 | Ht 64.5 in | Wt 143.0 lb

## 2015-07-19 DIAGNOSIS — M5431 Sciatica, right side: Secondary | ICD-10-CM | POA: Insufficient documentation

## 2015-07-19 MED ORDER — PREDNISONE 10 MG PO TABS: ORAL_TABLET | ORAL | Status: DC

## 2015-07-19 NOTE — Progress Notes (Addendum)
Patient ID: Krystal Simpson, female   DOB: [birth year], 54 y.o.   MRN: [id number]        Patient: Krystal Simpson Female    DOB: 1961/08/10   54 y.o.   MRN: [id number] Visit Date: 07/19/2015  Today's Provider: Margarita Rana, MD   Chief Complaint  Patient presents with  . Sciatica   Subjective:    HPI  Follow up for sciatica pain  The patient was last seen for this 1 months ago. Changes made at last visit include starting Flexeril and x-ray.  She reports excellent compliance with treatment. She feels that condition is Worse. She is not having side effects.  Patient reports that her pain has increased since last week. Patient reports pain keeps her up at night and wakes her from sleep. Patient has seen Dr. Oneida Alar sports medicine on 06/14/15, reports that he recommended pt to take 2 tablets of Flexeril. Did look at Xray.  Patient reports that she that Norco has not helped with sciatica pain. Patient reports that her pain radiates to her knee and ankle.   Gave her exercises and has not helped.        Allergies  Allergen Reactions  . Ciprofloxacin     Other reaction(s): Joint Pains  . Tetanus Toxoids Hives   Previous Medications   CHOLECALCIFEROL 1000 UNITS CAPSULE    Take 1,000 Units by mouth.    CLONAZEPAM (KLONOPIN) 0.5 MG TABLET    Take 0.5 mg by mouth 2 (two) times daily as needed.   CYANOCOBALAMIN PO    Take 2 tablets by mouth 2 (two) times daily.    CYCLOBENZAPRINE (FLEXERIL) 5 MG TABLET    Take 1 tablet (5 mg total) by mouth at bedtime.   DULOXETINE (CYMBALTA) 60 MG CAPSULE    Take 60 mg by mouth daily.    FLAXSEED, LINSEED, (FLAX SEEDS PO)    Take 1 capsule by mouth daily.   HYDROCODONE-ACETAMINOPHEN (NORCO) 10-325 MG TABLET    Take 1 tablet by mouth every 6 (six) hours as needed.   MELOXICAM (MOBIC) 15 MG TABLET    Take 1 tablet (15 mg total) by mouth daily.   MULTIPLE VITAMINS-MINERALS PO    Take 1 tablet by mouth daily.    OMEGA-3 FATTY ACIDS (FISH OIL) 1200 MG  CAPS    Take 2 capsules by mouth 2 (two) times daily.    PENNSAID 2 % SOLN       POLYETHYLENE GLYCOL POWDER (MIRALAX) POWDER    Take 1 Container by mouth once.   PROMETHAZINE (PHENERGAN) 25 MG TABLET    TAKE 1 TABLET (25 MG TOTAL) BY MOUTH EVERY 6 (SIX) HOURS AS NEEDED FOR NAUSEA.   SERTRALINE (ZOLOFT) 100 MG TABLET    Take 100 mg by mouth 2 (two) times daily.    SUMATRIPTAN (IMITREX) 100 MG TABLET    TAKE 1 AT HEADACHE ONSET, MAY REPEAT AFTER 2 HOURS, NO MORE THAN 2 IN A DAY,NO MORE THAN 4 IN A WEEK   TETRAHYDROZOLINE HCL (EYE DROPS OP)    Apply 1 drop to eye 2 (two) times daily.    TOPIRAMATE (TOPAMAX) 25 MG TABLET    Take 25 mg by mouth daily. 3 tablets at bedtime   TRAZODONE (DESYREL) 50 MG TABLET    50 mg at bedtime as needed.    TRIAMCINOLONE (NASACORT) 55 MCG/ACT AERO NASAL INHALER    Place 2 sprays into the nose daily.    Review of Systems  Constitutional:  Negative.   Musculoskeletal: Positive for myalgias and arthralgias.    Social History  Substance Use Topics  . Smoking status: Never Smoker   . Smokeless tobacco: Never Used  . Alcohol Use: No   Objective:   BP 98/60 mmHg  Pulse 76  Temp(Src) 97.9 F (36.6 C) (Oral)  Resp 16  Ht 5' 4.5" (1.638 m)  Wt 143 lb (64.864 kg)  BMI 24.18 kg/m2  SpO2 99%  Physical Exam  Constitutional: She is oriented to person, place, and time. She appears well-developed and well-nourished.  Musculoskeletal: She exhibits no edema or tenderness.  Straight leg raise negative bilaterally.    Neurological: She is alert and oriented to person, place, and time.  Psychiatric: She has a normal mood and affect. Her behavior is normal. Judgment and thought content normal.      Assessment & Plan:     1. Sciatica of right side without back pain Worsening. Will start prednisone. Patient instructed to call back if condition worsens or does not improve.    Take with food. Hold Meloxicam while on medication.   - predniSONE (DELTASONE) 10 MG tablet; 6  po for 2 days and then 5 po for 2 days and then 4 po for 2 days and 3 po for 2 days and then 2 po for 2 days and then 1 po for 2 days.  Dispense: 42 tablet; Refill: 0     Margarita Rana, MD   Margarita Rana, MD  [facility]

## 2015-08-07 ENCOUNTER — Ambulatory Visit
Admission: RE | Admit: 2015-08-07 | Discharge: 2015-08-07 | Disposition: A | Discharge status: Home / Self Care | Payer: BLUE CROSS/BLUE SHIELD | Source: Ambulatory Visit | Attending: Family Medicine | Admitting: Family Medicine

## 2015-08-07 ENCOUNTER — Encounter: Payer: Self-pay | Admitting: Family Medicine

## 2015-08-07 ENCOUNTER — Telehealth: Payer: Self-pay

## 2015-08-07 ENCOUNTER — Ambulatory Visit (INDEPENDENT_AMBULATORY_CARE_PROVIDER_SITE_OTHER): Payer: BLUE CROSS/BLUE SHIELD | Admitting: Family Medicine

## 2015-08-07 VITALS — BP 96/60 | HR 80 | Temp 98.0°F | Resp 16 | Ht 65.0 in | Wt 138.0 lb

## 2015-08-07 DIAGNOSIS — M79604 Pain in right leg: Secondary | ICD-10-CM | POA: Diagnosis not present

## 2015-08-07 DIAGNOSIS — M5137 Other intervertebral disc degeneration, lumbosacral region: Secondary | ICD-10-CM | POA: Diagnosis not present

## 2015-08-07 DIAGNOSIS — M545 Low back pain, unspecified: Secondary | ICD-10-CM

## 2015-08-07 DIAGNOSIS — M5431 Sciatica, right side: Secondary | ICD-10-CM | POA: Diagnosis not present

## 2015-08-07 MED ORDER — DICLOFENAC SODIUM 1 % TD GEL: 2.0000 g | Freq: Four times a day (QID) | TRANSDERMAL | Status: DC

## 2015-08-07 NOTE — Telephone Encounter (Signed)
-----   Message from Margarita Rana, MD sent at 08/07/2015  1:44 PM EST ----- Xrays show arthritis and degenerative disc disease. No lesions concerning for cancer.  Recommend proceed with visit to Dr. Sharlet Salina. Thanks.

## 2015-08-07 NOTE — Telephone Encounter (Signed)
Informed pt as below. Justin Meisenheimer Drozdowski, CMA  

## 2015-08-07 NOTE — Progress Notes (Signed)
Patient ID: Krystal Simpson, female   DOB: 14-May-1961, 54 y.o.   MRN: [id number]        Patient: Krystal Simpson Female    DOB: [birth year]   54 y.o.   MRN: [id number] Visit Date: 08/07/2015  Today's Provider: Margarita Rana, MD   Chief Complaint  Patient presents with  . Sciatica   Subjective:    HPI  Follow up for sciatica right leg  The patient was last seen for this 2 weeks ago. Changes made at last visit include starting prednisone taper for 12 days, tolerated medication.    She reports excellent compliance with treatment. She feels that condition is Unchanged. She is not having side effects. Patient reports that pain is worse at bedtime and reports that her pain is radiating to her lower back. Patient reports she has been using Voltaran gel 2 times a day. Patient is requesting refill.  Can not lay on her left side. Feels like it is in the bone.   Gel helps some.   So painful, make her tendonopathy less noticeable. Feels like the pain is in her bone.      Allergies  Allergen Reactions  . Ciprofloxacin     Other reaction(s): Joint Pains  . Tetanus Toxoids Hives   Previous Medications   CHOLECALCIFEROL 1000 UNITS CAPSULE    Take 1,000 Units by mouth.    CLONAZEPAM (KLONOPIN) 0.5 MG TABLET    Take 0.5 mg by mouth 2 (two) times daily as needed.   CYANOCOBALAMIN PO    Take 2 tablets by mouth 2 (two) times daily.    CYCLOBENZAPRINE (FLEXERIL) 5 MG TABLET    TAKE 1 TABLET (5 MG TOTAL) BY MOUTH AT BEDTIME.   DICLOFENAC SODIUM (VOLTAREN) 1 % GEL    Apply topically 4 (four) times daily.   DULOXETINE (CYMBALTA) 60 MG CAPSULE    Take 60 mg by mouth daily.    FLAXSEED, LINSEED, (FLAX SEEDS PO)    Take 1 capsule by mouth daily.   HYDROCODONE-ACETAMINOPHEN (NORCO) 10-325 MG TABLET    Take 1 tablet by mouth every 6 (six) hours as needed.   MULTIPLE VITAMINS-MINERALS PO    Take 1 tablet by mouth daily.    OMEGA-3 FATTY ACIDS (FISH OIL) 1200 MG CAPS    Take 2 capsules by mouth 2 (two)  times daily.    POLYETHYLENE GLYCOL POWDER (MIRALAX) POWDER    Take 1 Container by mouth once.   PROMETHAZINE (PHENERGAN) 25 MG TABLET    TAKE 1 TABLET (25 MG TOTAL) BY MOUTH EVERY 6 (SIX) HOURS AS NEEDED FOR NAUSEA.   SERTRALINE (ZOLOFT) 100 MG TABLET    Take 100 mg by mouth 2 (two) times daily.    SUMATRIPTAN (IMITREX) 100 MG TABLET    TAKE 1 AT HEADACHE ONSET, MAY REPEAT AFTER 2 HOURS, NO MORE THAN 2 IN A DAY,NO MORE THAN 4 IN A WEEK   TETRAHYDROZOLINE HCL (EYE DROPS OP)    Apply 1 drop to eye 2 (two) times daily.    TOPIRAMATE (TOPAMAX) 25 MG TABLET    Take 25 mg by mouth daily. 3 tablets at bedtime   TRAZODONE (DESYREL) 50 MG TABLET    50 mg at bedtime as needed.    TRIAMCINOLONE (NASACORT) 55 MCG/ACT AERO NASAL INHALER    Place 2 sprays into the nose daily.    Review of Systems  Constitutional: Negative.   Respiratory: Negative.   Cardiovascular: Negative.   Musculoskeletal: Positive for myalgias, back  pain and arthralgias.    Social History  Substance Use Topics  . Smoking status: Never Smoker   . Smokeless tobacco: Never Used  . Alcohol Use: No   Objective:   BP 96/60 mmHg  Pulse 80  Temp(Src) 98 F (36.7 C) (Oral)  Resp 16  Ht 5\' 5"  (1.651 m)  Wt 138 lb (62.596 kg)  BMI 22.96 kg/m2  SpO2 97%  Physical Exam  Constitutional: She is oriented to person, place, and time. She appears well-developed and well-nourished.  Pulmonary/Chest: She has no wheezes.  Musculoskeletal: She exhibits tenderness. She exhibits no edema.  Straight leg raise negative bilaterally. Tender at [facility] joint.   Neurological: She is alert and oriented to person, place, and time.  Psychiatric: She has a normal mood and affect. Her behavior is normal. Judgment and thought content normal.      Assessment & Plan:     1. Sciatica of right side Not improved.   2. Right-sided low back pain without sciatica Will check Xrays to rule out bone pathology.  Will refer to Dr. Sharlet Salina also.  - DG Lumbar  Spine Complete; Future - diclofenac sodium (VOLTAREN) 1 % GEL; Apply 2 g topically 4 (four) times daily.  Dispense: 100 g; Refill: 5 - Ambulatory referral to Orthopedic Surgery  3. Right leg pain Feels deep bone pain.   - DG FEMUR, MIN 2 VIEWS RIGHT; Future - diclofenac sodium (VOLTAREN) 1 % GEL; Apply 2 g topically 4 (four) times daily.  Dispense: 100 g; Refill: 5 - Ambulatory referral to [place], MD  [facility]

## 2015-08-10 DIAGNOSIS — G43109 Migraine with aura, not intractable, without status migrainosus: Secondary | ICD-10-CM | POA: Insufficient documentation

## 2015-08-12 DIAGNOSIS — M549 Dorsalgia, unspecified: Secondary | ICD-10-CM | POA: Insufficient documentation

## 2015-08-14 ENCOUNTER — Telehealth: Payer: Self-pay | Admitting: Family Medicine

## 2015-08-14 DIAGNOSIS — M545 Low back pain, unspecified: Secondary | ICD-10-CM

## 2015-08-14 NOTE — Telephone Encounter (Signed)
Pt is still having a lot of pain in her legs and lower back.  She said she was in last week to see you.  She is using the voltran cream that you prescribed.  She went to see her neurologist for her headaches and talked to him about her leg and back pain and he also prescribed her the voltran in a pill form so she as started taking that also.  She just states she is in a lot of pain and needs something to help her.  She said you were going to refer her to someone but she hasnt heard anything back...  She doesn't know what else to do for the pain...  Please advise .Marland [phone]  Thanks Con Memos

## 2015-08-14 NOTE — Telephone Encounter (Signed)
Sarah- Thought I referred patient to orthopedics. Can you check on this. Thanks.

## 2015-08-15 NOTE — Telephone Encounter (Signed)
Per pt she has appointment with Dr Sharlet Salina 09/28/15 at 8:00

## 2015-09-11 ENCOUNTER — Other Ambulatory Visit: Payer: Self-pay | Admitting: Family Medicine

## 2015-09-11 DIAGNOSIS — M7661 Achilles tendinitis, right leg: Secondary | ICD-10-CM

## 2015-09-11 DIAGNOSIS — M7662 Achilles tendinitis, left leg: Principal | ICD-10-CM

## 2015-09-11 MED ORDER — HYDROCODONE-ACETAMINOPHEN 10-325 MG PO TABS: 1.0000 | ORAL_TABLET | Freq: Four times a day (QID) | ORAL | Status: DC | PRN

## 2015-09-11 NOTE — Telephone Encounter (Signed)
Prescription printed. Please notify patient it is ready for pick up. Thanks- Dr. Breanna Mcdaniel.  

## 2015-09-11 NOTE — Telephone Encounter (Signed)
Pt needs refill on her HYDROcodone-acetaminophen (NORCO) 10-325 MG tablet   Please call her when ready [phone]  Thanks, teri

## 2015-09-19 ENCOUNTER — Telehealth: Payer: Self-pay | Admitting: Family Medicine

## 2015-09-19 NOTE — Telephone Encounter (Signed)
Pt called saying she talked to the nurse on call yesterday about a possible uti.  She was called in sulfamethoxazole temp. quanity 3.  And told her to come in Wednesday.  But she is not sure now if it is a UTI or just muscular back pain.  She still has burning with her urine but the right side is hurting.  She would like some one to call her back/.  Also having hot flashes with sweat/ does not have hx of kidney stones.  Tried to triage but nurses were busy.  Her number [phone].  ThanksTeri

## 2015-09-19 NOTE — Telephone Encounter (Signed)
Pt to schedule ov. sd

## 2015-09-20 ENCOUNTER — Encounter: Payer: Self-pay | Admitting: Family Medicine

## 2015-09-20 ENCOUNTER — Ambulatory Visit (INDEPENDENT_AMBULATORY_CARE_PROVIDER_SITE_OTHER): Payer: BLUE CROSS/BLUE SHIELD | Admitting: Family Medicine

## 2015-09-20 VITALS — BP 102/60 | HR 74 | Temp 97.7°F | Resp 16 | Wt 128.0 lb

## 2015-09-20 DIAGNOSIS — N3090 Cystitis, unspecified without hematuria: Secondary | ICD-10-CM

## 2015-09-20 DIAGNOSIS — R399 Unspecified symptoms and signs involving the genitourinary system: Secondary | ICD-10-CM | POA: Diagnosis not present

## 2015-09-20 DIAGNOSIS — R634 Abnormal weight loss: Secondary | ICD-10-CM | POA: Diagnosis not present

## 2015-09-20 HISTORY — PX: BASAL CELL CARCINOMA EXCISION: SHX1214

## 2015-09-20 LAB — POCT URINALYSIS DIPSTICK
BILIRUBIN UA: NEGATIVE
Blood, UA: NEGATIVE
GLUCOSE UA: NEGATIVE
KETONES UA: NEGATIVE
Nitrite, UA: NEGATIVE
Protein, UA: NEGATIVE
SPEC GRAV UA: 1.02
Urobilinogen, UA: NEGATIVE
pH, UA: 5

## 2015-09-20 MED ORDER — AMOXICILLIN-POT CLAVULANATE 875-125 MG PO TABS: 1.0000 | ORAL_TABLET | Freq: Two times a day (BID) | ORAL | Status: DC

## 2015-09-20 NOTE — Progress Notes (Signed)
Patient ID: [name], female   DOB: [birth year], 54 y.o.   MRN: [id number]       Patient: Krystal Simpson Female    DOB: 1961-05-10   54 y.o.   MRN: [id number] Visit Date: 09/20/2015  Today's Provider: Margarita Rana, MD   Chief Complaint  Patient presents with  . Urinary Tract Infection   Subjective:    HPI Pt reports that she has dysuria and frequency. She called the "call a nurse" and she called in Bactrim for 1 and a half days until she could get into see [place]. She reports that this did not help much. She increase her water intake. She does feel bad and she has lower back pain on the right side. She reports that she has had back problems in the past but this feels different.     Allergies  Allergen Reactions  . Ciprofloxacin     Other reaction(s): Joint Pains  . Tetanus Toxoids Hives   Previous Medications   CHOLECALCIFEROL 1000 UNITS CAPSULE    Take 1,000 Units by mouth.    CLONAZEPAM (KLONOPIN) 0.5 MG TABLET    Take 0.5 mg by mouth 2 (two) times daily as needed.   CYANOCOBALAMIN PO    Take 2 tablets by mouth 2 (two) times daily.    CYCLOBENZAPRINE (FLEXERIL) 5 MG TABLET    TAKE 1 TABLET (5 MG TOTAL) BY MOUTH AT BEDTIME.   DICLOFENAC SODIUM (VOLTAREN) 1 % GEL    Apply 2 g topically 4 (four) times daily.   DULOXETINE (CYMBALTA) 60 MG CAPSULE    Take 60 mg by mouth daily.    FLAXSEED, LINSEED, (FLAX SEEDS PO)    Take 1 capsule by mouth daily.   HYDROCODONE-ACETAMINOPHEN (NORCO) 10-325 MG TABLET    Take 1 tablet by mouth every 6 (six) hours as needed.   MULTIPLE VITAMINS-MINERALS PO    Take 1 tablet by mouth daily.    OMEGA-3 FATTY ACIDS (FISH OIL) 1200 MG CAPS    Take 2 capsules by mouth 2 (two) times daily.    POLYETHYLENE GLYCOL POWDER (MIRALAX) POWDER    Take 1 Container by mouth once.   PROMETHAZINE (PHENERGAN) 25 MG TABLET    TAKE 1 TABLET (25 MG TOTAL) BY MOUTH EVERY 6 (SIX) HOURS AS NEEDED FOR NAUSEA.   SERTRALINE (ZOLOFT) 100 MG TABLET    Take 100 mg by mouth 2  (two) times daily.    SUMATRIPTAN (IMITREX) 100 MG TABLET    TAKE 1 AT HEADACHE ONSET, MAY REPEAT AFTER 2 HOURS, NO MORE THAN 2 IN A DAY,NO MORE THAN 4 IN A WEEK   TETRAHYDROZOLINE HCL (EYE DROPS OP)    Apply 1 drop to eye 2 (two) times daily.    TOPIRAMATE (TOPAMAX) 25 MG TABLET    Take 25 mg by mouth daily. 3 tablets at bedtime   TRAZODONE (DESYREL) 50 MG TABLET    50 mg at bedtime as needed.    TRIAMCINOLONE (NASACORT) 55 MCG/ACT AERO NASAL INHALER    Place 2 sprays into the nose daily.    Review of Systems  Constitutional: Positive for fatigue.  HENT: Negative.   Eyes: Negative.   Respiratory: Negative.   Cardiovascular: Negative.   Gastrointestinal: Negative.   Genitourinary: Positive for dysuria, frequency and flank pain.  Musculoskeletal: Negative.   Skin: Negative.   Allergic/Immunologic: Negative.   Neurological: Negative.   Hematological: Negative.   Psychiatric/Behavioral: Negative.     Social History  Substance Use Topics  .  Smoking status: Never Smoker   . Smokeless tobacco: Never Used  . Alcohol Use: No   Objective:   BP 102/60 mmHg  Pulse 74  Temp(Src) 97.7 F (36.5 C) (Oral)  Resp 16  Wt 128 lb (58.06 kg)  Physical Exam  Constitutional: She is oriented to person, place, and time. She appears well-developed.  Cardiovascular: Normal rate, regular rhythm, normal heart sounds and intact distal pulses.   Pulmonary/Chest: Effort normal and breath sounds normal.  Abdominal: Soft. Bowel sounds are normal. There is no tenderness. There is no rebound.  Neurological: She is alert and oriented to person, place, and time.  Psychiatric: She has a normal mood and affect. Her behavior is normal. Judgment and thought content normal.        Assessment & Plan:     1. Urinary tract infection symptoms Still with leukocytes and symptoms.Will change antibiotic and send for culture.   - POCT urinalysis dipstick - Urine culture Results for orders placed or performed in  visit on 09/20/15  POCT urinalysis dipstick  Result Value Ref Range   Color, UA yellow    Clarity, UA clear    Glucose, UA neg    Bilirubin, UA neg    Ketones, UA neg    Spec Grav, UA 1.020    Blood, UA neg    pH, UA 5.0    Protein, UA neg    Urobilinogen, UA negative    Nitrite, UA neg    Leukocytes, UA Trace (A) Negative     2. Abnormal weight loss Has continued to loose weight, but not really trying anymore. Will check scan.   - CT Abdomen Pelvis W Contrast; Future - DG Chest 2 View; Future  3. Cystitis Condition is worsening. Will start medication for better control.  Patient instructed to call back if condition worsens or does not improve.    - amoxicillin-clavulanate (AUGMENTIN) 875-125 MG tablet; Take 1 tablet by mouth 2 (two) times daily.  Dispense: 14 tablet; Refill: 0      Patient was seen and examined by Dr. Margarita Rana, and noted scribed by Webb Laws, [place].  I have reviewed the document for accuracy and completeness and I agree with above. - Jerrell Belfast, MD   Margarita Rana, MD  [facility]

## 2015-09-21 ENCOUNTER — Telehealth: Payer: Self-pay | Admitting: Family Medicine

## 2015-09-21 LAB — URINALYSIS, MICROSCOPIC ONLY
BACTERIA UA: NONE SEEN
Casts: NONE SEEN /lpf
Epithelial Cells (non renal): NONE SEEN /hpf (ref 0–10)

## 2015-09-21 LAB — URINALYSIS
BILIRUBIN UA: NEGATIVE
GLUCOSE, UA: NEGATIVE
KETONES UA: NEGATIVE
Leukocytes, UA: NEGATIVE
Nitrite, UA: NEGATIVE
Protein, UA: NEGATIVE
RBC, UA: NEGATIVE
Specific Gravity, UA: 1.008 (ref 1.005–1.030)
UUROB: 0.2 mg/dL (ref 0.2–1.0)
pH, UA: 6 (ref 5.0–7.5)

## 2015-09-21 LAB — URINE CULTURE: ORGANISM ID, BACTERIA: NO GROWTH

## 2015-09-21 NOTE — Telephone Encounter (Signed)
[employer] wants to speak peer to peer to get CT approved.Phone # [phone]

## 2015-09-21 NOTE — Telephone Encounter (Signed)
Auth number [id number].  Thanks.

## 2015-09-22 ENCOUNTER — Telehealth: Payer: Self-pay

## 2015-09-22 NOTE — Telephone Encounter (Signed)
-----   Message from Krystal Rana, MD sent at 09/21/2015  5:19 PM EST ----- Urine with no growth.  Please see how patient is doing. Thanks.

## 2015-09-22 NOTE — Telephone Encounter (Signed)
LMTCB Emily Drozdowski, CMA  

## 2015-09-26 ENCOUNTER — Telehealth: Payer: Self-pay | Admitting: Family Medicine

## 2015-09-26 DIAGNOSIS — R3915 Urgency of urination: Secondary | ICD-10-CM

## 2015-09-26 NOTE — Telephone Encounter (Signed)
Can refer to urology. Thanks.

## 2015-09-26 NOTE — Telephone Encounter (Signed)
Pt states she constantly feels like she has to go to bathroom.No pain with urination and is still taking antibiotic that was prescribed.Please advise

## 2015-09-26 NOTE — Telephone Encounter (Signed)
Pt agreed to see urology. Renaldo Fiddler, CMA

## 2015-09-28 ENCOUNTER — Other Ambulatory Visit: Payer: Self-pay | Admitting: Physical Medicine and Rehabilitation

## 2015-09-28 DIAGNOSIS — M5417 Radiculopathy, lumbosacral region: Secondary | ICD-10-CM

## 2015-09-29 ENCOUNTER — Ambulatory Visit
Admission: RE | Admit: 2015-09-29 | Discharge: 2015-09-29 | Disposition: A | Discharge status: Home / Self Care | Payer: BLUE CROSS/BLUE SHIELD | Source: Ambulatory Visit | Attending: Family Medicine | Admitting: Family Medicine

## 2015-09-29 DIAGNOSIS — R634 Abnormal weight loss: Secondary | ICD-10-CM

## 2015-09-29 DIAGNOSIS — N132 Hydronephrosis with renal and ureteral calculous obstruction: Secondary | ICD-10-CM | POA: Diagnosis not present

## 2015-09-29 MED ORDER — IOHEXOL 300 MG/ML  SOLN
100.0000 mL | Freq: Once | INTRAMUSCULAR | Status: AC | PRN
  Administered 2015-09-29: 100 mL via INTRAVENOUS

## 2015-10-04 ENCOUNTER — Ambulatory Visit (INDEPENDENT_AMBULATORY_CARE_PROVIDER_SITE_OTHER): Payer: BLUE CROSS/BLUE SHIELD | Admitting: Family Medicine

## 2015-10-04 ENCOUNTER — Encounter: Payer: Self-pay | Admitting: Family Medicine

## 2015-10-04 VITALS — BP 100/68 | HR 76 | Temp 98.2°F | Resp 16 | Ht 64.5 in | Wt 123.0 lb

## 2015-10-04 DIAGNOSIS — M7661 Achilles tendinitis, right leg: Secondary | ICD-10-CM | POA: Diagnosis not present

## 2015-10-04 DIAGNOSIS — R634 Abnormal weight loss: Secondary | ICD-10-CM | POA: Diagnosis not present

## 2015-10-04 DIAGNOSIS — R079 Chest pain, unspecified: Secondary | ICD-10-CM

## 2015-10-04 DIAGNOSIS — Z1239 Encounter for other screening for malignant neoplasm of breast: Secondary | ICD-10-CM | POA: Diagnosis not present

## 2015-10-04 DIAGNOSIS — M7662 Achilles tendinitis, left leg: Secondary | ICD-10-CM

## 2015-10-04 DIAGNOSIS — F419 Anxiety disorder, unspecified: Secondary | ICD-10-CM | POA: Diagnosis not present

## 2015-10-04 DIAGNOSIS — R7989 Other specified abnormal findings of blood chemistry: Secondary | ICD-10-CM | POA: Diagnosis not present

## 2015-10-04 DIAGNOSIS — Z0000 Encounter for general adult medical examination without abnormal findings: Secondary | ICD-10-CM

## 2015-10-04 MED ORDER — HYDROCODONE-ACETAMINOPHEN 10-325 MG PO TABS: 1.0000 | ORAL_TABLET | Freq: Four times a day (QID) | ORAL | Status: DC | PRN

## 2015-10-04 NOTE — Telephone Encounter (Signed)
Please call patient. Printed out labs slip. Would like her to get labs done this afternoon or tomorrow so will have by urology visit on Friday. No need to fast. Thanks.

## 2015-10-04 NOTE — Progress Notes (Signed)
Patient ID: MICKI QUERY, female   DOB: 1961/07/10, 55 y.o.   MRN: [id number]       Patient: Krystal Simpson, Female    DOB: 02-21-1961, 55 y.o.   MRN: [id number] Visit Date: 10/04/2015  Today's Provider: Margarita Rana, MD   Chief Complaint  Patient presents with  . Annual Exam   Subjective:    Annual physical exam Krystal Simpson is a 55 y.o. female who presents today for health maintenance and complete physical. She feels fairly well. She reports exercising 3 times a week. She reports she is sleeping fairly well, on medications. 09/30/14 CPE 07/22/13 Pap-neg; HPV-neg 10/26/14 Mammo-BI-RADS 3 08/16/13 Colon-normal  Lab Results  Component Value Date   WBC 8.2 06/01/2015   HGB 13.5 09/30/2014   HCT 38.8 06/01/2015   PLT 336 06/01/2015   CHOL 205* 09/30/2014   TRIG 100 09/30/2014   HDL 53 09/30/2014   LDLCALC 132 09/30/2014   ALT 11 09/30/2014   AST 18 09/30/2014   NA 139 09/30/2014   K 4.4 09/30/2014   CREATININE 0.7 09/30/2014   TSH 0.860 06/01/2015   -----------------------------------------------------------------  Weight loss: Patient concerned about weight loss. Patient reports she has changed her diet and has cut out soda and junk food completely.  Wt Readings from Last 3 Encounters:  10/04/15 123 lb (55.792 kg)  09/20/15 128 lb (58.06 kg)  08/07/15 138 lb (62.596 kg)     Chest pain: Patient reports she has been having sharp pain in her chest that last about 15 seconds at a time.  Review of Systems  Constitutional: Positive for diaphoresis and unexpected weight change.       Crying, irritability  HENT: Positive for ear pain and hearing loss.   Eyes: Positive for photophobia.  Respiratory: Negative.   Cardiovascular: Positive for chest pain.  Gastrointestinal: Negative.   Endocrine: Positive for cold intolerance.  Genitourinary: Positive for frequency and vaginal pain.  Musculoskeletal: Positive for back pain.  Skin: Positive for wound.    Allergic/Immunologic: Negative.   Neurological: Positive for headaches.  Hematological: Negative.   Psychiatric/Behavioral: Positive for sleep disturbance, decreased concentration and agitation. The patient is nervous/anxious.     Social History      She  reports that she has never smoked. She has never used smokeless tobacco. She reports that she does not drink alcohol or use illicit drugs.       Social History   Social History  . Marital Status: Married    Spouse Name: N/A  . Number of Children: N/A  . Years of Education: N/A   Social History Main Topics  . Smoking status: Never Smoker   . Smokeless tobacco: Never Used  . Alcohol Use: No  . Drug Use: No  . Sexual Activity: Not Asked   Other Topics Concern  . None   Social History Narrative    Past Medical History  Diagnosis Date  . Abnormal Pap smear of cervix   . Depression   . Anxiety   . Cancer [facility])     skin     Patient Active Problem List   Diagnosis Date Noted  . Back ache 08/12/2015  . Right leg pain 08/07/2015  . Sciatica of right side 07/19/2015  . Low back pain 06/27/2015  . Abnormal thyroid stimulating hormone (TSH) level 06/01/2015  . Achilles bursitis 06/01/2015  . Absolute anemia 06/01/2015  . Anxiety 06/01/2015  . Basal cell carcinoma 06/01/2015  . Colon polyp 06/01/2015  .  Clinical depression 06/01/2015  . Esophageal reflux 06/01/2015  . Headache, migraine 06/01/2015  . Adiposity 06/01/2015  . Hypercholesterolemia without hypertriglyceridemia 06/01/2015  . Squamous cell carcinoma ([place]) 06/01/2015  . Fecal occult blood test positive 06/01/2015  . Avitaminosis D 06/01/2015  . Abnormal mammogram 03/01/2015  . Achilles tendonitis, bilateral 10/12/2014  . Disordered sleep 03/03/2014    Past Surgical History  Procedure Laterality Date  . Basal cell carcinoma excision  2014  . Squamous cell carcinoma excision  2014  . Mohs surgery  2015    top of head Dr. Shelda Jakes at [facility]  . Basal cell  carcinoma excision  09/20/2015    Dr. Evorn Gong, left mid forearm    Family History        Family Status  Relation Status Death Age  . Mother Deceased 70  . Father Deceased 93  . Sister Alive   . Maternal Grandmother Deceased 1    MI        Her family history includes Emphysema in her mother; Healthy in her sister; Heart attack in her maternal grandmother; Lung cancer in her father.    Allergies  Allergen Reactions  . Ciprofloxacin     Other reaction(s): Joint Pains  . Tetanus Toxoids Hives    Previous Medications   CHOLECALCIFEROL 1000 UNITS CAPSULE    Take 1,000 Units by mouth 2 (two) times daily.    CLONAZEPAM (KLONOPIN) 0.5 MG TABLET    Take 0.5 mg by mouth 2 (two) times daily.    CYANOCOBALAMIN PO    Take 2 tablets by mouth daily.    CYCLOBENZAPRINE (FLEXERIL) 5 MG TABLET    Reported on 10/04/2015   DICLOFENAC (VOLTAREN) 75 MG EC TABLET    Take 75 mg by mouth daily.    DICLOFENAC SODIUM (VOLTAREN) 1 % GEL    Apply 2 g topically 4 (four) times daily.   DULOXETINE (CYMBALTA) 60 MG CAPSULE    Take 60 mg by mouth daily.    FLAXSEED, LINSEED, (FLAX SEEDS PO)    Take 1 capsule by mouth daily.   GABAPENTIN (NEURONTIN) 300 MG CAPSULE    1 po qHS x 7 days, then 2 po qHS   HYDROCODONE-ACETAMINOPHEN (NORCO) 10-325 MG TABLET    Take 1 tablet by mouth every 6 (six) hours as needed.   MULTIPLE VITAMINS-MINERALS PO    Take 1 tablet by mouth daily.    OMEGA-3 FATTY ACIDS (FISH OIL) 1200 MG CAPS    Take 2 capsules by mouth 2 (two) times daily.    POLYETHYLENE GLYCOL POWDER (MIRALAX) POWDER    Take 1 Container by mouth once.   PROMETHAZINE (PHENERGAN) 25 MG TABLET    TAKE 1 TABLET (25 MG TOTAL) BY MOUTH EVERY 6 (SIX) HOURS AS NEEDED FOR NAUSEA.   SERTRALINE (ZOLOFT) 100 MG TABLET    Take 100 mg by mouth 2 (two) times daily.    SUMATRIPTAN (IMITREX) 100 MG TABLET    TAKE 1 AT HEADACHE ONSET, MAY REPEAT AFTER 2 HOURS, NO MORE THAN 2 IN A DAY,NO MORE THAN 4 IN A WEEK   TETRAHYDROZOLINE HCL (EYE  DROPS OP)    Apply 1 drop to eye 2 (two) times daily.    TOPIRAMATE (TOPAMAX) 25 MG TABLET    Take 25 mg by mouth daily. 3 tablets at bedtime   TRAZODONE (DESYREL) 50 MG TABLET    50 mg at bedtime as needed. Reported on 10/04/2015   TRIAMCINOLONE (NASACORT) 55 MCG/ACT AERO NASAL INHALER  Place 2 sprays into the nose daily.    Patient Care Team: Margarita Rana, MD as PCP - General (Family Medicine)     Objective:   Vitals: BP 100/68 mmHg  Pulse 76  Temp(Src) 98.2 F (36.8 C) (Oral)  Resp 16  Ht 5' 4.5" (1.638 m)  Wt 123 lb (55.792 kg)  BMI 20.79 kg/m2   Physical Exam  Constitutional: She is oriented to person, place, and time. She appears well-developed and well-nourished.  HENT:  Head: Normocephalic and atraumatic.  Right Ear: Tympanic membrane, external ear and ear canal normal.  Left Ear: Tympanic membrane, external ear and ear canal normal.  Nose: Nose normal.  Mouth/Throat: Uvula is midline, oropharynx is clear and moist and mucous membranes are normal.  Eyes: Conjunctivae, EOM and lids are normal. Pupils are equal, round, and reactive to light.  Neck: Trachea normal and normal range of motion. Neck supple. Carotid bruit is not present. No thyroid mass and no thyromegaly present.  Cardiovascular: Normal rate, regular rhythm and normal heart sounds.   Pulmonary/Chest: Effort normal and breath sounds normal.  Abdominal: Soft. Normal appearance and bowel sounds are normal. There is no hepatosplenomegaly. There is no tenderness.  Genitourinary: No breast swelling, tenderness or discharge.  Musculoskeletal: Normal range of motion.  Lymphadenopathy:    She has no cervical adenopathy.    She has no axillary adenopathy.  Neurological: She is alert and oriented to person, place, and time. She has normal strength. No cranial nerve deficit.  Skin: Skin is warm, dry and intact.  Psychiatric: She has a normal mood and affect. Her speech is normal and behavior is normal. Judgment and  thought content normal. Cognition and memory are normal.     Depression Screen PHQ 2/9 Scores 10/04/2015 02/15/2015 11/23/2014 10/12/2014  PHQ - 2 Score 2 0 0 0  PHQ- 9 Score 11 - - -  Exception Documentation - Other- indicate reason in comment box - -      Assessment & Plan:     Routine Health Maintenance and Physical Exam  Exercise Activities and Dietary recommendations Goals    . Exercise 150 minutes per week (moderate activity)       Immunization History  Administered Date(s) Administered  . Influenza, Seasonal, Injecte, Preservative Fre 06/24/2015   1. Annual physical exam As above. Still loosing weight.  Has follow up Friday with urology re: stone.  Further plan as below.    2. Chest pain, unspecified chest pain type Normal EKG.  - EKG 12-Lead  3. Abnormal weight loss Recheck labs. Patient has dramatically changed diet. Try to eat more calories. Further plan pending these results.  - CBC with Differential/Platelet - Ferritin - Prealbumin - Comprehensive metabolic panel - Sedimentation rate  4. Breast cancer screening Will schedule.  - MM DIGITAL SCREENING BILATERAL; Future  5. Achilles tendonitis, bilateral Refilled medication. May be taking to much. Stressed importance of not doing this.   - HYDROcodone-acetaminophen (NORCO) 10-325 MG tablet; Take 1 tablet by mouth every 6 (six) hours as needed.  Dispense: 100 tablet; Refill: 0  6. Abnormal thyroid stimulating hormone (TSH) level Check labs.  - TSH  7. Anxiety Follow up with psychiatry.    Patient was seen and examined by Jerrell Belfast, MD, and note scribed by Lynford Humphrey, [place].   I have reviewed the document for accuracy and completeness and I agree with above. Jerrell Belfast, MD   Margarita Rana, MD    --------------------------------------------------------------------

## 2015-10-04 NOTE — Telephone Encounter (Signed)
Lab slip at front desk for pickup. Patient is aware.

## 2015-10-05 LAB — COMPREHENSIVE METABOLIC PANEL
A/G RATIO: 2.4 (ref 1.1–2.5)
ALBUMIN: 4.4 g/dL (ref 3.5–5.5)
ALT: 32 IU/L (ref 0–32)
AST: 26 IU/L (ref 0–40)
Alkaline Phosphatase: 55 IU/L (ref 39–117)
BILIRUBIN TOTAL: 0.5 mg/dL (ref 0.0–1.2)
BUN / CREAT RATIO: 23 (ref 9–23)
BUN: 15 mg/dL (ref 6–24)
CHLORIDE: 104 mmol/L (ref 96–106)
CO2: 23 mmol/L (ref 18–29)
Calcium: 9.6 mg/dL (ref 8.7–10.2)
Creatinine, Ser: 0.66 mg/dL (ref 0.57–1.00)
GFR calc non Af Amer: 100 mL/min/{1.73_m2} (ref >59)
GFR, EST AFRICAN AMERICAN: 115 mL/min/{1.73_m2} (ref >59)
Globulin, Total: 1.8 g/dL (ref 1.5–4.5)
Glucose: 85 mg/dL (ref 65–99)
POTASSIUM: 3.8 mmol/L (ref 3.5–5.2)
SODIUM: 143 mmol/L (ref 134–144)
TOTAL PROTEIN: 6.2 g/dL (ref 6.0–8.5)

## 2015-10-05 LAB — TSH: TSH: 2.36 u[IU]/mL (ref 0.450–4.500)

## 2015-10-05 LAB — CBC WITH DIFFERENTIAL/PLATELET
BASOS: 1 %
Basophils Absolute: 0.1 10*3/uL (ref 0.0–0.2)
EOS (ABSOLUTE): 0.1 10*3/uL (ref 0.0–0.4)
Eos: 1 %
Hematocrit: 32.1 % — ABNORMAL LOW (ref 34.0–46.6)
Hemoglobin: 10.9 g/dL — ABNORMAL LOW (ref 11.1–15.9)
IMMATURE GRANULOCYTES: 0 %
Immature Grans (Abs): 0 10*3/uL (ref 0.0–0.1)
Lymphocytes Absolute: 3.9 10*3/uL — ABNORMAL HIGH (ref 0.7–3.1)
Lymphs: 51 %
MCH: 29.9 pg (ref 26.6–33.0)
MCHC: 34 g/dL (ref 31.5–35.7)
MCV: 88 fL (ref 79–97)
MONOS ABS: 0.6 10*3/uL (ref 0.1–0.9)
Monocytes: 8 %
NEUTROS ABS: 2.9 10*3/uL (ref 1.4–7.0)
NEUTROS PCT: 39 %
PLATELETS: 299 10*3/uL (ref 150–379)
RBC: 3.64 x10E6/uL — ABNORMAL LOW (ref 3.77–5.28)
RDW: 13.3 % (ref 12.3–15.4)
WBC: 7.6 10*3/uL (ref 3.4–10.8)

## 2015-10-05 LAB — SEDIMENTATION RATE: Sed Rate: 2 mm/hr (ref 0–40)

## 2015-10-05 LAB — PREALBUMIN: PREALBUMIN: 25 mg/dL (ref 10–36)

## 2015-10-05 LAB — FERRITIN: Ferritin: 162 ng/mL — ABNORMAL HIGH (ref 15–150)

## 2015-10-06 ENCOUNTER — Encounter: Payer: Self-pay | Admitting: Obstetrics and Gynecology

## 2015-10-06 ENCOUNTER — Ambulatory Visit (INDEPENDENT_AMBULATORY_CARE_PROVIDER_SITE_OTHER): Payer: BLUE CROSS/BLUE SHIELD | Admitting: Obstetrics and Gynecology

## 2015-10-06 ENCOUNTER — Telehealth: Payer: Self-pay

## 2015-10-06 VITALS — BP 102/64 | HR 65 | Resp 16 | Ht 65.0 in | Wt 124.8 lb

## 2015-10-06 DIAGNOSIS — R3915 Urgency of urination: Secondary | ICD-10-CM | POA: Diagnosis not present

## 2015-10-06 DIAGNOSIS — N200 Calculus of kidney: Secondary | ICD-10-CM | POA: Diagnosis not present

## 2015-10-06 DIAGNOSIS — N1330 Unspecified hydronephrosis: Secondary | ICD-10-CM

## 2015-10-06 DIAGNOSIS — K5909 Other constipation: Secondary | ICD-10-CM

## 2015-10-06 DIAGNOSIS — N131 Hydronephrosis with ureteral stricture, not elsewhere classified: Secondary | ICD-10-CM | POA: Diagnosis not present

## 2015-10-06 LAB — MICROSCOPIC EXAMINATION: BACTERIA UA: NONE SEEN

## 2015-10-06 LAB — URINALYSIS, COMPLETE
BILIRUBIN UA: NEGATIVE
Glucose, UA: NEGATIVE
Ketones, UA: NEGATIVE
Leukocytes, UA: NEGATIVE
NITRITE UA: NEGATIVE
PH UA: 5.5 (ref 5.0–7.5)
Protein, UA: NEGATIVE
Specific Gravity, UA: 1.01 (ref 1.005–1.030)
UUROB: 0.2 mg/dL (ref 0.2–1.0)

## 2015-10-06 LAB — BLADDER SCAN AMB NON-IMAGING

## 2015-10-06 NOTE — Progress Notes (Signed)
10/06/2015 9:53 AM   Krystal Simpson [birth year] [id number]  Referring provider: Margarita Rana, MD [address] [place], [place] [address]  Chief Complaint  Patient presents with  . Urinary Urgency  . Establish Care    HPI: Patient is a 55 year old female presenting today as a referral from her primary care provider recent symptoms of urinary frequency urgency, dysuria, right flank pain as well as sweats. She was evaluated by her primary care provider and a CT abdomen and pelvis with contrast dye was performed. A 5 mm left UVJ stone with left hydroureter nephrosis was noted. A moderate amount of stool was also noted in the colon. Patient does report that she has a history of constipation and takes chronic narcotic pain medications for treatment of her Achilles tendon pain.  Today patient reports complete resolution of her urinary symptoms. She continues to experience right flank soreness. She denies any gross hematuria or fevers.  No previous history of renal stones.  09/20/15 UA and culture negative  PMH: Past Medical History  Diagnosis Date  . Abnormal Pap smear of cervix   . Depression   . Anxiety   . Cancer [facility])     skin    Surgical History: Past Surgical History  Procedure Laterality Date  . Basal cell carcinoma excision  2014  . Squamous cell carcinoma excision  2014  . Mohs surgery  2015    top of head Dr. Shelda Jakes at [facility]  . Basal cell carcinoma excision  09/20/2015    Dr. Evorn Gong, left mid forearm    Home Medications:    Medication List       This list is accurate as of: 10/06/15  9:53 AM.  Always use your most recent med list.               Cholecalciferol 1000 units capsule  Take 1,000 Units by mouth 2 (two) times daily.     clonazePAM 0.5 MG tablet  Commonly known as:  KLONOPIN  Take 0.5 mg by mouth 2 (two) times daily.     CYANOCOBALAMIN PO  Take 2 tablets by mouth daily.     cyclobenzaprine 5 MG tablet  Commonly known as:   FLEXERIL  Reported on 10/04/2015     diclofenac 75 MG EC tablet  Commonly known as:  VOLTAREN  Take 75 mg by mouth daily.     diclofenac sodium 1 % Gel  Commonly known as:  VOLTAREN  Apply 2 g topically 4 (four) times daily.     DULoxetine 60 MG capsule  Commonly known as:  CYMBALTA  Take 60 mg by mouth daily.     EYE DROPS OP  Apply 1 drop to eye 2 (two) times daily.     Fish Oil 1200 MG Caps  Take 2 capsules by mouth 2 (two) times daily.     FLAX SEEDS PO  Take 1 capsule by mouth daily.     gabapentin 300 MG capsule  Commonly known as:  NEURONTIN  1 po qHS x 7 days, then 2 po qHS     HYDROcodone-acetaminophen 10-325 MG tablet  Commonly known as:  NORCO  Take 1 tablet by mouth every 6 (six) hours as needed.     MIRALAX powder  Generic drug:  polyethylene glycol powder  Take 1 Container by mouth once.     MULTIPLE VITAMINS-MINERALS PO  Take 1 tablet by mouth daily.     promethazine 25 MG tablet  Commonly known as:  PHENERGAN  TAKE 1 TABLET (25 MG TOTAL) BY MOUTH EVERY 6 (SIX) HOURS AS NEEDED FOR NAUSEA.     sertraline 100 MG tablet  Commonly known as:  ZOLOFT  Take 100 mg by mouth 2 (two) times daily.     SUMAtriptan 100 MG tablet  Commonly known as:  IMITREX  TAKE 1 AT HEADACHE ONSET, MAY REPEAT AFTER 2 HOURS, NO MORE THAN 2 IN A DAY,NO MORE THAN 4 IN A WEEK     topiramate 25 MG tablet  Commonly known as:  TOPAMAX  Take 25 mg by mouth daily. 3 tablets at bedtime     traZODone 50 MG tablet  Commonly known as:  DESYREL  50 mg at bedtime as needed. Reported on 10/04/2015     triamcinolone 55 MCG/ACT Aero nasal inhaler  Commonly known as:  NASACORT  Place 2 sprays into the nose daily.        Allergies:  Allergies  Allergen Reactions  . Ciprofloxacin     Other reaction(s): Joint Pains  . Tetanus Toxoids Hives    Family History: Family History  Problem Relation Age of Onset  . Emphysema Mother   . Lung cancer Father   . Healthy Sister   . Heart  attack Maternal Grandmother     Social History:  reports that she has never smoked. She has never used smokeless tobacco. She reports that she does not drink alcohol or use illicit drugs.  ROS: UROLOGY Frequent Urination?: No Hard to postpone urination?: Yes Burning/pain with urination?: No Get up at night to urinate?: Yes Leakage of urine?: Yes Urine stream starts and stops?: No Trouble starting stream?: No Do you have to strain to urinate?: No Blood in urine?: No Urinary tract infection?: No Sexually transmitted disease?: No Injury to kidneys or bladder?: No Painful intercourse?: No Weak stream?: No Currently pregnant?: No Vaginal bleeding?: No Last menstrual period?: n/a  Gastrointestinal Nausea?: No Vomiting?: No Indigestion/heartburn?: No Diarrhea?: No Constipation?: No  Constitutional Fever: No Night sweats?: Yes Weight loss?: Yes Fatigue?: Yes  Skin Skin rash/lesions?: Yes Itching?: No  Eyes Blurred vision?: Yes Double vision?: No  Ears/Nose/Throat Sore throat?: No Sinus problems?: Yes  Hematologic/Lymphatic Swollen glands?: No Easy bruising?: No  Cardiovascular Leg swelling?: No Chest pain?: Yes  Respiratory Cough?: No Shortness of breath?: No  Endocrine Excessive thirst?: Yes  Musculoskeletal Back pain?: Yes Joint pain?: Yes  Neurological Headaches?: Yes Dizziness?: No  Psychologic Depression?: Yes Anxiety?: Yes  Physical Exam: BP 102/64 mmHg  Pulse 65  Resp 16  Ht 5\' 5"  (1.651 m)  Wt 124 lb 12.8 oz (56.609 kg)  BMI 20.77 kg/m2  Constitutional:  Alert and oriented, No acute distress. HEENT: [place] AT, moist mucus membranes.  Trachea midline, no masses. Cardiovascular: No clubbing, cyanosis, or edema. Respiratory: Normal respiratory effort, no increased work of breathing. GI: Abdomen is soft, nontender, nondistended, no abdominal masses GU: No CVA tenderness. Skin: No rashes, bruises or suspicious lesions. Lymph: No  cervical or inguinal adenopathy. Neurologic: Grossly intact, no focal deficits, moving all 4 extremities. Psychiatric: Normal mood and affect.  Laboratory Data:   Urinalysis    Component Value Date/Time   GLUCOSEU Negative 09/20/2015 0000   BILIRUBINUR neg 09/20/2015 1415   BILIRUBINUR Negative 09/20/2015 0000   PROTEINUR neg 09/20/2015 1415   UROBILINOGEN negative 09/20/2015 1415   NITRITE neg 09/20/2015 1415   NITRITE Negative 09/20/2015 0000   LEUKOCYTESUR Trace* 09/20/2015 1415   LEUKOCYTESUR Negative 09/20/2015 0000    Pertinent  Imaging: CLINICAL DATA: Right flank pain. Abnormal weight loss.  EXAM: CT ABDOMEN AND PELVIS WITH CONTRAST  TECHNIQUE: Multidetector CT imaging of the abdomen and pelvis was performed using the standard protocol following bolus administration of intravenous contrast.  CONTRAST: 177mL OMNIPAQUE IOHEXOL 300 MG/ML SOLN  COMPARISON: None.  FINDINGS: Lower chest: Clear lung bases. Normal heart size.  Hepatobiliary: 2.7 x 1.5 cm hypodense, fluid attenuating left hepatic mass with a smaller adjacent 14 mm mass most consistent with cysts. 8 mm hypodense, fluid attenuating left hepatic mass most consistent with a small cyst. Other smaller subcentimeter hypodensities are noted in the left hepatic lobe which are too small to characterize. Distended gallbladder without other focal abnormality.  Pancreas: Normal.  Spleen: Normal.  Adrenals/Urinary Tract: Normal adrenal glands. Left renal cortical scarring. Normal kidneys. 5 mm left UVJ calculus with mild left hydroureteronephrosis.  Stomach/Bowel: No bowel wall thickening or dilatation. Moderate amount of stool throughout the colon. No pneumatosis, pneumoperitoneum or portal venous gas. Normal appendix. No abdominal or pelvic free fluid.  Vascular/Lymphatic: Normal caliber abdominal aorta. No lymphadenopathy.  Reproductive: Normal uterus. No adnexal mass. Dominant  right ovarian follicle.  Other: No fluid collection or hematoma.  Musculoskeletal: No aggressive lytic or sclerotic osseous lesion. Mild osteoarthritis of the left hip. Mild degenerative changes of bilateral SI joints.  IMPRESSION: 1. 5 mm left UVJ calculus with mild left hydroureteronephrosis. 2. Moderate amount of stool throughout the colon.   Electronically Signed  By: Kathreen Devoid  On: 09/29/2015 09:12  Assessment & Plan:    1. Nephrolithiasis- 5 mm left UVJ stone noted with mild hydroureteronephrosis. She reports resolution of previous urinary symptoms. She denies fevers or left flank pain. I suspect that she has likely passed her stone or it resides in her bladder. I provided patient with a strainer encouraged her to push fluids. We'll follow up with a renal ultrasound and KUB to ensure that stone pasted as well as resolution of hydronephrosis. Advised to seek immediate medical attention for fevers, uncontrolled pain or vomiting. -KUB & RUS - Urinalysis, Complete - BLADDER SCAN AMB NON-IMAGING  2. Constipation- Right flank pain could possibly be related to patient's chronic constipation. I recommended more aggressive in her dimensions such as a stronger laxative to evacuate a moderate amount of stool in her colon. This may improve her abdominal pain. Gallbladder was also noted to be distended on her CT. I suggested that she discuss this further with her primary care provider.   Return in about 3 weeks (around 10/27/2015) for RUS and KUB results.  These notes generated with voice recognition software. I apologize for typographical errors.  Herbert Moors, [facility] [address], [place], [place] [address] [phone]

## 2015-10-06 NOTE — Telephone Encounter (Signed)
-----   Message from Margarita Rana, MD sent at 10/05/2015  4:16 PM EST ----- Labs stable except mildly anemic. Check CBC,  TIBC and iron in one week. Also, repeat oc light.  Thanks.

## 2015-10-06 NOTE — Telephone Encounter (Signed)
Left message to call back  

## 2015-10-06 NOTE — Telephone Encounter (Signed)
Advised patient as below.  

## 2015-10-13 ENCOUNTER — Telehealth: Payer: Self-pay | Admitting: Family Medicine

## 2015-10-13 DIAGNOSIS — D649 Anemia, unspecified: Secondary | ICD-10-CM

## 2015-10-13 NOTE — Telephone Encounter (Signed)
Lab sheet printed; pt advised.   Thanks,   -Mickel Baas

## 2015-10-13 NOTE — Telephone Encounter (Signed)
Pt is requesting a lab slip to have iron rechecked.  Pt would like to pick this up today if possible/MW

## 2015-10-13 NOTE — Telephone Encounter (Signed)
Ok to print out lab slip. Thanks.  

## 2015-10-14 LAB — IRON AND TIBC
Iron Saturation: 41 % (ref 15–55)
Iron: 96 ug/dL (ref 27–159)
TIBC: 233 ug/dL — AB (ref 250–450)
UIBC: 137 ug/dL (ref 131–425)

## 2015-10-14 LAB — CBC WITH DIFFERENTIAL/PLATELET
BASOS ABS: 0 10*3/uL (ref 0.0–0.2)
Basos: 1 %
EOS (ABSOLUTE): 0.2 10*3/uL (ref 0.0–0.4)
Eos: 2 %
Hematocrit: 32.9 % — ABNORMAL LOW (ref 34.0–46.6)
Hemoglobin: 11.2 g/dL (ref 11.1–15.9)
Immature Grans (Abs): 0 10*3/uL (ref 0.0–0.1)
Immature Granulocytes: 0 %
LYMPHS ABS: 3.1 10*3/uL (ref 0.7–3.1)
Lymphs: 48 %
MCH: 30.9 pg (ref 26.6–33.0)
MCHC: 34 g/dL (ref 31.5–35.7)
MCV: 91 fL (ref 79–97)
MONOCYTES: 9 %
MONOS ABS: 0.6 10*3/uL (ref 0.1–0.9)
Neutrophils Absolute: 2.5 10*3/uL (ref 1.4–7.0)
Neutrophils: 40 %
Platelets: 272 10*3/uL (ref 150–379)
RBC: 3.63 x10E6/uL — AB (ref 3.77–5.28)
RDW: 13.4 % (ref 12.3–15.4)
WBC: 6.4 10*3/uL (ref 3.4–10.8)

## 2015-10-16 ENCOUNTER — Telehealth: Payer: Self-pay

## 2015-10-16 NOTE — Telephone Encounter (Signed)
Advised pt of lab results. Pt verbally acknowledges understanding. Emily Drozdowski, CMA   

## 2015-10-16 NOTE — Telephone Encounter (Signed)
-----   Message from Margarita Rana, MD sent at 10/15/2015  8:48 AM EST ----- Labs stable. Please notify patient. Thanks.

## 2015-10-17 ENCOUNTER — Ambulatory Visit
Admission: RE | Admit: 2015-10-17 | Discharge: 2015-10-17 | Disposition: A | Discharge status: Home / Self Care | Payer: BLUE CROSS/BLUE SHIELD | Source: Ambulatory Visit | Attending: Physical Medicine and Rehabilitation | Admitting: Physical Medicine and Rehabilitation

## 2015-10-17 DIAGNOSIS — M5417 Radiculopathy, lumbosacral region: Secondary | ICD-10-CM

## 2015-10-17 DIAGNOSIS — M5416 Radiculopathy, lumbar region: Secondary | ICD-10-CM | POA: Diagnosis not present

## 2015-10-18 ENCOUNTER — Ambulatory Visit
Admission: RE | Admit: 2015-10-18 | Discharge: 2015-10-18 | Disposition: A | Discharge status: Home / Self Care | Payer: BLUE CROSS/BLUE SHIELD | Source: Ambulatory Visit | Attending: Obstetrics and Gynecology | Admitting: Obstetrics and Gynecology

## 2015-10-18 DIAGNOSIS — R3915 Urgency of urination: Secondary | ICD-10-CM | POA: Diagnosis not present

## 2015-10-18 DIAGNOSIS — N1330 Unspecified hydronephrosis: Secondary | ICD-10-CM

## 2015-10-18 DIAGNOSIS — N200 Calculus of kidney: Secondary | ICD-10-CM

## 2015-10-20 ENCOUNTER — Ambulatory Visit
Admission: RE | Admit: 2015-10-20 | Discharge: 2015-10-20 | Disposition: A | Discharge status: Home / Self Care | Payer: BLUE CROSS/BLUE SHIELD | Source: Ambulatory Visit | Attending: Family Medicine | Admitting: Family Medicine

## 2015-10-20 DIAGNOSIS — Z1231 Encounter for screening mammogram for malignant neoplasm of breast: Secondary | ICD-10-CM | POA: Diagnosis not present

## 2015-10-20 DIAGNOSIS — Z1239 Encounter for other screening for malignant neoplasm of breast: Secondary | ICD-10-CM

## 2015-10-24 ENCOUNTER — Ambulatory Visit: Payer: BLUE CROSS/BLUE SHIELD | Admitting: Sports Medicine

## 2015-10-27 ENCOUNTER — Ambulatory Visit: Payer: BLUE CROSS/BLUE SHIELD | Admitting: Obstetrics and Gynecology

## 2015-10-30 ENCOUNTER — Encounter: Payer: Self-pay | Admitting: Obstetrics and Gynecology

## 2015-10-30 ENCOUNTER — Ambulatory Visit (INDEPENDENT_AMBULATORY_CARE_PROVIDER_SITE_OTHER): Payer: BLUE CROSS/BLUE SHIELD | Admitting: Obstetrics and Gynecology

## 2015-10-30 VITALS — BP 111/71 | HR 72 | Resp 16 | Ht 65.0 in

## 2015-10-30 DIAGNOSIS — R3915 Urgency of urination: Secondary | ICD-10-CM

## 2015-10-30 LAB — MICROSCOPIC EXAMINATION
Bacteria, UA: NONE SEEN
RBC MICROSCOPIC, UA: NONE SEEN /HPF (ref 0–?)

## 2015-10-30 LAB — URINALYSIS, COMPLETE
Bilirubin, UA: NEGATIVE
GLUCOSE, UA: NEGATIVE
KETONES UA: NEGATIVE
LEUKOCYTES UA: NEGATIVE
NITRITE UA: NEGATIVE
Protein, UA: NEGATIVE
RBC, UA: NEGATIVE
SPEC GRAV UA: 1.01 (ref 1.005–1.030)
Urobilinogen, Ur: 0.2 mg/dL (ref 0.2–1.0)
pH, UA: 6.5 (ref 5.0–7.5)

## 2015-10-30 NOTE — Patient Instructions (Signed)
Dietary Guidelines to Help Prevent Kidney Stones Your risk of kidney stones can be decreased by adjusting the foods you eat. The most important thing you can do is drink enough fluid. You should drink enough fluid to keep your urine clear or pale yellow. The following guidelines provide specific information for the type of kidney stone you have had. GUIDELINES ACCORDING TO TYPE OF KIDNEY STONE Calcium Oxalate Kidney Stones  Reduce the amount of salt you eat. Foods that have a lot of salt cause your body to release excess calcium into your urine. The excess calcium can combine with a substance called oxalate to form kidney stones.  Reduce the amount of animal protein you eat if the amount you eat is excessive. Animal protein causes your body to release excess calcium into your urine. Ask your dietitian how much protein from animal sources you should be eating.  Avoid foods that are high in oxalates. If you take vitamins, they should have less than 500 mg of vitamin C. Your body turns vitamin C into oxalates. You do not need to avoid fruits and vegetables high in vitamin C. Calcium Phosphate Kidney Stones  Reduce the amount of salt you eat to help prevent the release of excess calcium into your urine.  Reduce the amount of animal protein you eat if the amount you eat is excessive. Animal protein causes your body to release excess calcium into your urine. Ask your dietitian how much protein from animal sources you should be eating.  Get enough calcium from food or take a calcium supplement (ask your dietitian for recommendations). Food sources of calcium that do not increase your risk of kidney stones include:  Broccoli.  Dairy products, such as cheese and yogurt.  Pudding. Uric Acid Kidney Stones  Do not have more than 6 oz of animal protein per day. FOOD SOURCES Animal Protein Sources  Meat (all types).  Poultry.  Eggs.  Fish, seafood. Foods High in Salt  Salt seasonings.  Soy  sauce.  Teriyaki sauce.  Cured and processed meats.  Salted crackers and snack foods.  Fast food.  Canned soups and most canned foods. Foods High in Oxalates  Grains:  Amaranth.  Barley.  Grits.  Wheat germ.  Bran.  Buckwheat flour.  All bran cereals.  Pretzels.  Whole wheat bread.  Vegetables:  Beans (wax).  Beets and beet greens.  Collard greens.  Eggplant.  Escarole.  Leeks.  Okra.  Parsley.  [place].  Spinach.  Swiss chard.  Tomato paste.  Fried potatoes.  Sweet potatoes.  Fruits:  Red currants.  Figs.  Kiwi.  Rhubarb.  Meat and Other Protein Sources:  Beans (dried).  Soy burgers and other soybean products.  Miso.  Nuts (peanuts, almonds, pecans, cashews, hazelnuts).  Nut butters.  Sesame seeds and tahini (paste made of sesame seeds).  Poppy seeds.  Beverages:  Chocolate drink mixes.  Soy milk.  Instant iced tea.  Juices made from high-oxalate fruits or vegetables.  Other:  Carob.  Chocolate.  Fruitcake.  Marmalades.   This information is not intended to replace advice given to you by your health care provider. Make sure you discuss any questions you have with your health care provider.   Document Released: 01/04/2011 Document Revised: 09/14/2013 Document Reviewed: 08/06/2013 Elsevier Interactive Patient Education 2016 Elsevier Inc.  

## 2015-10-30 NOTE — Progress Notes (Signed)
8:55 AM   Krystal Simpson 1961-03-29 [id number]  Referring provider: Margarita Rana, MD [address] [place], [place] [address]  Chief Complaint  Patient presents with  . Results    RUS/KUB  . Urinary Urgency    HPI: Patient is a 55 year old female presenting today as a referral from her primary care provider recent symptoms of urinary frequency urgency, dysuria, right flank pain as well as sweats. She was evaluated by her primary care provider and a CT abdomen and pelvis with contrast dye was performed. A 5 mm left UVJ stone with left hydroureteronephrosis was noted. A moderate amount of stool was also noted in the colon. Patient does report that she has a history of constipation and takes chronic narcotic pain medications for treatment of her Achilles tendon pain.  Today patient reports complete resolution of her urinary symptoms. She continues to experience right flank soreness. She denies any gross hematuria or fevers.  No previous history of renal stones.  09/20/15 UA and culture negative  Interval History: Patient presents today for results of her follow-up renal ultrasound and KUB. Renal ultrasound demonstrates no acute abnormality. Previously identified left hydronephrosis has resolved. KUB unremarkable.  PMH: Past Medical History  Diagnosis Date  . Abnormal Pap smear of cervix   . Depression   . Anxiety   . Cancer [facility]     skin    Surgical History: Past Surgical History  Procedure Laterality Date  . Basal cell carcinoma excision  2014  . Squamous cell carcinoma excision  2014  . Mohs surgery  2015    top of head Dr. Shelda Jakes at [facility]  . Basal cell carcinoma excision  09/20/2015    Dr. Evorn Gong, left mid forearm    Home Medications:    Medication List       This list is accurate as of: 10/30/15 11:59 PM.  Always use your most recent med list.               Cholecalciferol 1000 units capsule  Take 1,000 Units by mouth 2 (two) times daily.      clonazePAM 0.5 MG tablet  Commonly known as:  KLONOPIN  Take 0.5 mg by mouth 2 (two) times daily.     CYANOCOBALAMIN PO  Take 2 tablets by mouth daily.     cyclobenzaprine 5 MG tablet  Commonly known as:  FLEXERIL  Reported on 10/04/2015     diclofenac 75 MG EC tablet  Commonly known as:  VOLTAREN  Take 75 mg by mouth daily.     diclofenac sodium 1 % Gel  Commonly known as:  VOLTAREN  Apply 2 g topically 4 (four) times daily.     DULoxetine 60 MG capsule  Commonly known as:  CYMBALTA  Take 60 mg by mouth daily.     EYE DROPS OP  Apply 1 drop to eye 2 (two) times daily.     Fish Oil 1200 MG Caps  Take 2 capsules by mouth 2 (two) times daily.     FLAX SEEDS PO  Take 1 capsule by mouth daily.     gabapentin 300 MG capsule  Commonly known as:  NEURONTIN  1 po qHS x 7 days, then 2 po qHS     HYDROcodone-acetaminophen 10-325 MG tablet  Commonly known as:  NORCO  Take 1 tablet by mouth every 6 (six) hours as needed.     meloxicam 15 MG tablet  Commonly known as:  MOBIC  MIRALAX powder  Generic drug:  polyethylene glycol powder  Take 1 Container by mouth once.     MULTIPLE VITAMINS-MINERALS PO  Take 1 tablet by mouth daily.     promethazine 25 MG tablet  Commonly known as:  PHENERGAN  TAKE 1 TABLET (25 MG TOTAL) BY MOUTH EVERY 6 (SIX) HOURS AS NEEDED FOR NAUSEA.     sertraline 100 MG tablet  Commonly known as:  ZOLOFT  Take 100 mg by mouth 2 (two) times daily.     SUMAtriptan 100 MG tablet  Commonly known as:  IMITREX  TAKE 1 AT HEADACHE ONSET, MAY REPEAT AFTER 2 HOURS, NO MORE THAN 2 IN A DAY,NO MORE THAN 4 IN A WEEK     topiramate 25 MG tablet  Commonly known as:  TOPAMAX  Take 25 mg by mouth daily. 3 tablets at bedtime     traZODone 50 MG tablet  Commonly known as:  DESYREL  50 mg at bedtime as needed. Reported on 10/04/2015     triamcinolone 55 MCG/ACT Aero nasal inhaler  Commonly known as:  NASACORT  Place 2 sprays into the nose daily.         Allergies:  Allergies  Allergen Reactions  . Ciprofloxacin     Other reaction(s): Joint Pains  . Tetanus Toxoids Hives    Family History: Family History  Problem Relation Age of Onset  . Emphysema Mother   . Lung cancer Father   . Healthy Sister   . Heart attack Maternal Grandmother     Social History:  reports that she has never smoked. She has never used smokeless tobacco. She reports that she does not drink alcohol or use illicit drugs.  ROS: UROLOGY Frequent Urination?: No Hard to postpone urination?: No Burning/pain with urination?: No Get up at night to urinate?: No Leakage of urine?: No Urine stream starts and stops?: No Trouble starting stream?: No Do you have to strain to urinate?: No Blood in urine?: No Urinary tract infection?: No Sexually transmitted disease?: No Injury to kidneys or bladder?: No Painful intercourse?: No Weak stream?: No Currently pregnant?: No Vaginal bleeding?: No Last menstrual period?: n  Gastrointestinal Nausea?: No Vomiting?: No Indigestion/heartburn?: No Diarrhea?: No Constipation?: No  Constitutional Fever: No Night sweats?: No Weight loss?: No Fatigue?: No  Skin Skin rash/lesions?: No Itching?: No  Eyes Blurred vision?: No Double vision?: No  Ears/Nose/Throat Sore throat?: No Sinus problems?: No  Hematologic/Lymphatic Swollen glands?: No Easy bruising?: No  Cardiovascular Leg swelling?: No Chest pain?: No  Respiratory Cough?: No Shortness of breath?: No  Endocrine Excessive thirst?: No  Musculoskeletal Back pain?: Yes Joint pain?: No  Neurological Headaches?: Yes Dizziness?: No  Psychologic Depression?: Yes Anxiety?: Yes  Physical Exam: BP 111/71 mmHg  Pulse 72  Resp 16  Ht 5\' 5"  (1.651 m)  Constitutional:  Alert and oriented, No acute distress. HEENT: [place] AT, moist mucus membranes.  Trachea midline, no masses. Cardiovascular: No clubbing, cyanosis, or edema. Respiratory:  Normal respiratory effort, no increased work of breathing. GI: Abdomen is soft, nontender, nondistended, no abdominal masses GU: No CVA tenderness. Skin: No rashes, bruises or suspicious lesions. Lymph: No cervical or inguinal adenopathy. Neurologic: Grossly intact, no focal deficits, moving all 4 extremities. Psychiatric: Normal mood and affect.  Laboratory Data:   Urinalysis Results for orders placed or performed in visit on 10/30/15  Microscopic Examination  Result Value Ref Range   WBC, UA 0-5 0 -  5 /hpf   RBC, UA None  seen 0 -  2 /hpf   Epithelial Cells (non renal) 0-10 0 - 10 /hpf   Bacteria, UA None seen None seen/Few  Urinalysis, Complete  Result Value Ref Range   Specific Gravity, UA 1.010 1.005 - 1.030   pH, UA 6.5 5.0 - 7.5   Color, UA Yellow Yellow   Appearance Ur Clear Clear   Leukocytes, UA Negative Negative   Protein, UA Negative Negative/Trace   Glucose, UA Negative Negative   Ketones, UA Negative Negative   RBC, UA Negative Negative   Bilirubin, UA Negative Negative   Urobilinogen, Ur 0.2 0.2 - 1.0 mg/dL   Nitrite, UA Negative Negative   Microscopic Examination See below:     Pertinent Imaging: CLINICAL DATA: Nephrolithiasis. EXAM: RENAL / URINARY TRACT ULTRASOUND COMPLETE COMPARISON: CT 01/06/ 2017. FINDINGS: Right Kidney: Length: 11.0 cm. Echogenicity within normal limits. No mass or hydronephrosis visualized. Focal area scarring noted in the upper to midportion of the right kidney, this is stable from prior CT . Left Kidney: Length: 10.6 cm. Echogenicity within normal limits. No mass or hydronephrosis visualized. Previously identified left hydronephrosis has resolved. Bladder: Appears normal for degree of bladder distention.  IMPRESSION: 1. No acute abnormality. Previously identified left hydronephrosis has resolved.  2. Focal area of scarring noted the upper to midportion right kidney, no change from prior CT of  09/29/2015.   Electronically Signed  By: Marcello Moores Register  On: 10/18/2015 09:56  CLINICAL DATA: Abdominal pain, history of kidney stones  EXAM: ABDOMEN - 1 VIEW  COMPARISON: CT scan 09/29/2015  FINDINGS: There is normal small bowel gas pattern. Moderate colonic stool noted in transverse colon proximal left colon. Some colonic gas noted distal sigmoid colon and rectum. No free abdominal air.  IMPRESSION: Normal small bowel gas pattern. Moderate colonic stool in transverse colon. Some colonic gas noted no free abdominal air. Electronically Signed  By: Lahoma Crocker M.D.  On: 10/18/2015 09:54  Assessment & Plan:    1. Nephrolithiasis- 5 mm left UVJ stone noted with mild hydroureteronephrosis. She reports resolution of previous urinary symptoms.  No evidence of continued obstruction.  Microscopic hematuria resolved.  Imaging results reviewed with patient and questioned answer. -KUB negative & RUS hydronephrosis resolved - Urinalysis, Complete - BLADDER SCAN AMB NON-IMAGING  2. Constipation- Right flank pain could possibly be related to patient's chronic constipation. I recommended more aggressive in her dimensions such as a stronger laxative to evacuate a moderate amount of stool in her colon. This may improve her abdominal pain. Gallbladder was also noted to be distended on her CT. I suggested that she discuss this further with her primary care provider.   Return if symptoms worsen or fail to improve.  These notes generated with voice recognition software. I apologize for typographical errors.  Herbert Moors, [facility] [address] [place], [place] [address] [phone]

## 2015-11-08 ENCOUNTER — Ambulatory Visit (INDEPENDENT_AMBULATORY_CARE_PROVIDER_SITE_OTHER): Payer: BLUE CROSS/BLUE SHIELD | Admitting: Sports Medicine

## 2015-11-08 ENCOUNTER — Encounter: Payer: Self-pay | Admitting: Sports Medicine

## 2015-11-08 VITALS — BP 104/58 | Ht 65.0 in | Wt 124.0 lb

## 2015-11-08 DIAGNOSIS — M7661 Achilles tendinitis, right leg: Secondary | ICD-10-CM

## 2015-11-08 DIAGNOSIS — M7662 Achilles tendinitis, left leg: Secondary | ICD-10-CM

## 2015-11-08 NOTE — Progress Notes (Signed)
  HPI: [name] is a 55 y.o. 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  She reports she is doing better than at her last visit. She has been biking quite a bit. She did have increased pain primarily in her right Achilles when she tried walking on an inclined treadmill 3 days ago.  She continues to use ankle compression sleeves & heel lifts.  She states that she gets good relief from these.  She reports that she has been able to bike 30 minutes at time, perform calf stretches and leg extension exercises without issue.  She has lost 80 pounds since last year!  She is seen a back specialist for lumbar radiculopathy. They've ordered MRI and she is following up with [place] in the next few weeks.   ROS per HPI Review of Systems  Constitutional: Negative.   Musculoskeletal: Positive for back pain. Negative for myalgias, joint pain and falls.  Skin: Negative.   Neurological: Positive for tingling. Negative for dizziness, tremors, sensory change, speech change, focal weakness and seizures.      Physical Exam: BP 104/58 mmHg  Ht 5\' 5"  (1.651 m)  Wt 124 lb (56.246 kg)  BMI 20.63 kg/m2  Physical Exam  Constitutional: She appears well-developed and well-nourished.  Musculoskeletal: Normal range of motion.  Neurological: She has normal reflexes.  Inspection: achilles tendon nodules present bilaterally R> L both located ~ 2 cm above the calcaneous Lower extremities: knee extension/flexion 5/5 BLE, ankle inversion 5/5 BL, eversion 5/5 L and 4/5 R, ankle dorsiflexion 5/5 BLE, ankle planarflexion 4/5 R and 5/5 L.  ROM: full ROM of ankle  Sensation: intact in BLE Reflexes: Patellar and Achilles reflexes 2+ BL Gait: eversion of right foot, no antalgic gait   U/S: R achilles:No abnormal doppler flow; norm fibrillary pattern now noted w no calcifications.  AP 0.76 --> 0.70--> 0.66 --> 0.62 L achilles: No abnormal doppler flow; norm  fibrillary pattern/ AP 0.74 --> 0.70 --> 0.65.  Width 1.64 -->1.21 Doppler flow now norm bilat  Assessment/Plan  [name] is a 55 y.o. presenting for follow up of bilateral Cipro induced achilles tendinopathy with continued improvement in her clinical symptoms and ultrasound. She has done wonderfully to lose an estimated 80 pounds since her initial visit here which has undoubtedly helped with her Achilles tendinopathy - She will continue with her ankle sleeves and heel lifts. - unable to tolerate nitroglycerin due to history of migraines -Continue with stretching and eccentric strengthening exercises. - She will begin with more intense cycling - Follow up 6 months or sooner if needed. Call with any questions in the interim.

## 2015-11-13 ENCOUNTER — Other Ambulatory Visit: Payer: Self-pay | Admitting: Physical Medicine and Rehabilitation

## 2015-11-13 DIAGNOSIS — M25551 Pain in right hip: Secondary | ICD-10-CM

## 2015-11-14 ENCOUNTER — Encounter: Payer: Self-pay | Admitting: Family Medicine

## 2015-11-14 ENCOUNTER — Ambulatory Visit (INDEPENDENT_AMBULATORY_CARE_PROVIDER_SITE_OTHER): Payer: BLUE CROSS/BLUE SHIELD | Admitting: Family Medicine

## 2015-11-14 VITALS — BP 90/58 | HR 60 | Temp 98.0°F | Resp 16 | Wt 119.0 lb

## 2015-11-14 DIAGNOSIS — N951 Menopausal and female climacteric states: Secondary | ICD-10-CM

## 2015-11-14 DIAGNOSIS — H8110 Benign paroxysmal vertigo, unspecified ear: Secondary | ICD-10-CM | POA: Diagnosis not present

## 2015-11-14 DIAGNOSIS — R634 Abnormal weight loss: Secondary | ICD-10-CM

## 2015-11-14 DIAGNOSIS — R232 Flushing: Secondary | ICD-10-CM

## 2015-11-14 NOTE — Progress Notes (Signed)
Subjective:    Patient ID: Krystal Simpson, female    DOB: 12-Oct-1960, 55 y.o.   MRN: [id number]  HPI  Hot Flashes X 1 month. Pt is experiencing up to 8 hot flashes per day. Does affect sleep. Pt reports there is a family H/O breast cancer in her half sister on her mother's side. No family H/O ovarian cancer. Has has them for the past month. Can not sleep. Start in legs and goes up her body.  Can not sleep and then gets the chilled. Sweats for no reason. Thinks that is what it is.  Has also lost more weight. Does still watch what she eats, but not trying to loose weight.    Ear Pain Is a numbness that pt has noticed since [facility] clinic at [facility]  8 months ago. That had numbness after injecting cortisone behind right ear for skin cancer 2 and 1/2 weeks ago. Worse numbness since had injections.    Pt has also noticed dizziness after receiving these injections.  Also with certain movements.    Review of Systems  Constitutional: Positive for chills, diaphoresis and unexpected weight change (See HPI. ). Negative for fever, activity change, appetite change and fatigue.  HENT: Positive for ear pain.   Respiratory: Negative.   Cardiovascular: Negative.   Neurological: Positive for dizziness.   BP 90/58 mmHg  Pulse 60  Temp(Src) 98 F (36.7 C) (Oral)  Resp 16  Wt 119 lb (53.978 kg)   Patient Active Problem List   Diagnosis Date Noted  . Abnormal weight loss 10/04/2015  . Back ache 08/12/2015  . Right leg pain 08/07/2015  . Sciatica of right side 07/19/2015  . Low back pain 06/27/2015  . Abnormal thyroid stimulating hormone (TSH) level 06/01/2015  . Absolute anemia 06/01/2015  . Anxiety 06/01/2015  . Basal cell carcinoma 06/01/2015  . Colon polyp 06/01/2015  . Clinical depression 06/01/2015  . Esophageal reflux 06/01/2015  . Headache, migraine 06/01/2015  . Adiposity 06/01/2015  . Hypercholesterolemia without hypertriglyceridemia 06/01/2015  . Squamous cell carcinoma ([place]) 06/01/2015    . Avitaminosis D 06/01/2015  . Achilles tendonitis, bilateral 10/12/2014  . Disordered sleep 03/03/2014   Past Medical History  Diagnosis Date  . Abnormal Pap smear of cervix   . Depression   . Anxiety   . Cancer [facility])     skin   Current Outpatient Prescriptions on File Prior to Visit  Medication Sig  . Cholecalciferol 1000 UNITS capsule Take 1,000 Units by mouth 2 (two) times daily.   . clonazePAM (KLONOPIN) 0.5 MG tablet Take 0.5 mg by mouth 2 (two) times daily.   . CYANOCOBALAMIN PO Take 2 tablets by mouth daily.   . cyclobenzaprine (FLEXERIL) 5 MG tablet Reported on 10/04/2015  . diclofenac (VOLTAREN) 75 MG EC tablet Take 75 mg by mouth daily.   . diclofenac sodium (VOLTAREN) 1 % GEL Apply 2 g topically 4 (four) times daily.  . DULoxetine (CYMBALTA) 60 MG capsule Take 60 mg by mouth daily.   . Flaxseed, Linseed, (FLAX SEEDS PO) Take 1 capsule by mouth daily.  Marland Kitchen gabapentin (NEURONTIN) 300 MG capsule 1 po qHS x 7 days, then 2 po qHS  . HYDROcodone-acetaminophen (NORCO) 10-325 MG tablet Take 1 tablet by mouth every 6 (six) hours as needed.  . meloxicam (MOBIC) 15 MG tablet   . MULTIPLE VITAMINS-MINERALS PO Take 1 tablet by mouth daily.   . Omega-3 Fatty Acids (FISH OIL) 1200 MG CAPS Take 2 capsules by mouth  2 (two) times daily.   . polyethylene glycol powder (MIRALAX) powder Take 1 Container by mouth once.  . promethazine (PHENERGAN) 25 MG tablet TAKE 1 TABLET (25 MG TOTAL) BY MOUTH EVERY 6 (SIX) HOURS AS NEEDED FOR NAUSEA.  Marland Kitchen sertraline (ZOLOFT) 100 MG tablet Take 100 mg by mouth 2 (two) times daily.   . SUMAtriptan (IMITREX) 100 MG tablet TAKE 1 AT HEADACHE ONSET, MAY REPEAT AFTER 2 HOURS, NO MORE THAN 2 IN A DAY,NO MORE THAN 4 IN A WEEK  . SUMAtriptan (IMITREX) 100 MG tablet   . Tetrahydrozoline HCl (EYE DROPS OP) Apply 1 drop to eye 2 (two) times daily.   Marland Kitchen topiramate (TOPAMAX) 25 MG tablet Take 25 mg by mouth daily. 3 tablets at bedtime  . traZODone (DESYREL) 50 MG tablet 50  mg at bedtime as needed. Reported on 10/04/2015  . triamcinolone (NASACORT) 55 MCG/ACT AERO nasal inhaler Place 2 sprays into the nose daily.   No current facility-administered medications on file prior to visit.   Allergies  Allergen Reactions  . Ciprofloxacin     Other reaction(s): Joint Pains  . Tetanus Toxoids Hives   Past Surgical History  Procedure Laterality Date  . Basal cell carcinoma excision  2014  . Squamous cell carcinoma excision  2014  . Mohs surgery  2015    top of head Dr. Shelda Jakes at [facility]  . Basal cell carcinoma excision  09/20/2015    Dr. Evorn Gong, left mid forearm   Social History   Social History  . Marital Status: Married    Spouse Name: N/A  . Number of Children: N/A  . Years of Education: N/A   Occupational History  . Not on file.   Social History Main Topics  . Smoking status: Never Smoker   . Smokeless tobacco: Never Used  . Alcohol Use: No  . Drug Use: No  . Sexual Activity: Not on file   Other Topics Concern  . Not on file   Social History Narrative   Family History  Problem Relation Age of Onset  . Emphysema Mother   . Lung cancer Father   . Healthy Sister   . Heart attack Maternal Grandmother   . Breast cancer Sister       Objective:   Physical Exam  Constitutional: She is oriented to person, place, and time. She appears well-developed and well-nourished.  Cardiovascular: Normal rate and regular rhythm.   Pulmonary/Chest: Effort normal and breath sounds normal.  Neurological: She is alert and oriented to person, place, and time.  Psychiatric: She has a normal mood and affect. Her behavior is normal. Judgment and thought content normal.  BP 90/58 mmHg  Pulse 60  Temp(Src) 98 F (36.7 C) (Oral)  Resp 16  Wt 119 lb (53.978 kg)      Assessment & Plan:  1. Hot flashes Worsening. Unclear if related to weight loss. Will check labs and reassess.  - Follicle stimulating hormone - Luteinizing hormone  2. Abnormal weight  loss Still with weight loss. Will check labs.  - Thyroid Panel With TSH - Cortisol-am, blood - Metanephrines, urine, 24 hour; Future  3. BPPV (benign paroxysmal positional vertigo), unspecified laterality Improved. Hand out with exercises given.   Margarita Rana, MD

## 2015-11-15 ENCOUNTER — Other Ambulatory Visit: Payer: Self-pay | Admitting: Family Medicine

## 2015-11-15 DIAGNOSIS — M7661 Achilles tendinitis, right leg: Secondary | ICD-10-CM

## 2015-11-15 DIAGNOSIS — M7662 Achilles tendinitis, left leg: Principal | ICD-10-CM

## 2015-11-15 MED ORDER — HYDROCODONE-ACETAMINOPHEN 10-325 MG PO TABS: 1.0000 | ORAL_TABLET | Freq: Four times a day (QID) | ORAL | Status: DC | PRN

## 2015-11-15 NOTE — Telephone Encounter (Signed)
Pt contacted office for refill request on the following medications: °HYDROcodone-acetaminophen (NORCO) 10-325 MG tablet. Thanks TNP °

## 2015-11-15 NOTE — Telephone Encounter (Signed)
Prescription printed. Please notify patient it is ready for pick up. Thanks- Dr. Moranda Billiot.  

## 2015-11-19 LAB — THYROID PANEL WITH TSH
FREE THYROXINE INDEX: 2.3 (ref 1.2–4.9)
T3 Uptake Ratio: 35 % (ref 24–39)
T4 TOTAL: 6.7 ug/dL (ref 4.5–12.0)
TSH: 0.626 u[IU]/mL (ref 0.450–4.500)

## 2015-11-19 LAB — METANEPHRINES, URINE, 24 HOUR
METANEPHRINES 24H UR: 86 ug/(24.h) (ref 45–290)
Metaneph Total, Ur: 101 ug/L
Normetanephrine, 24H Ur: 230 ug/24 hr (ref 82–500)
Normetanephrine, Ur: 271 ug/L

## 2015-11-19 LAB — CORTISOL-AM, BLOOD: CORTISOL - AM: 18.5 ug/dL (ref 6.2–19.4)

## 2015-11-19 LAB — LUTEINIZING HORMONE: LH: 53 m[IU]/mL

## 2015-11-19 LAB — FOLLICLE STIMULATING HORMONE: FSH: 108.8 m[IU]/mL

## 2015-11-20 ENCOUNTER — Telehealth: Payer: Self-pay

## 2015-11-20 DIAGNOSIS — N951 Menopausal and female climacteric states: Secondary | ICD-10-CM

## 2015-11-20 DIAGNOSIS — R634 Abnormal weight loss: Secondary | ICD-10-CM

## 2015-11-20 MED ORDER — BLACK COHOSH 160 MG PO CAPS: 1.0000 | ORAL_CAPSULE | Freq: Every day | ORAL | Status: DC

## 2015-11-20 NOTE — Telephone Encounter (Signed)
Ok to order Chest CT for abnormal weight loss. Also, is post-menopausal.  Krystal Simpson went thru late and is why having hot flashes. Thanks.

## 2015-11-20 NOTE — Telephone Encounter (Signed)
Pt advised.  She agreed to the Chest CT.  Also she was questioning her hot flashes.  Did her blood work show any reason for her hot flashes?  Thanks,   -Mickel Baas

## 2015-11-20 NOTE — Telephone Encounter (Signed)
-----   Message from Margarita Rana, MD sent at 11/20/2015 12:27 PM EST ----- Labs all normal. Would recommend Chest CT secondary to weight loss if patient would agree. Thanks.

## 2015-11-20 NOTE — Telephone Encounter (Signed)
Advised pt. Ordered CT and told pt to try OTC Black Cohosh for hot flashes. Agrees to try medication, will call back if sx persist. Renaldo Fiddler, CMA

## 2015-11-22 ENCOUNTER — Telehealth: Payer: Self-pay | Admitting: Family Medicine

## 2015-11-22 NOTE — Telephone Encounter (Signed)
Insurance company would like to speak peer to peer to get CT approved.Phone [phone]

## 2015-11-28 ENCOUNTER — Other Ambulatory Visit: Payer: Self-pay

## 2015-11-28 DIAGNOSIS — M7662 Achilles tendinitis, left leg: Principal | ICD-10-CM

## 2015-11-28 DIAGNOSIS — M7661 Achilles tendinitis, right leg: Secondary | ICD-10-CM

## 2015-11-28 MED ORDER — MELOXICAM 15 MG PO TABS: 15.0000 mg | ORAL_TABLET | Freq: Every day | ORAL | Status: DC

## 2015-11-30 ENCOUNTER — Telehealth: Payer: Self-pay

## 2015-11-30 ENCOUNTER — Ambulatory Visit
Admission: RE | Admit: 2015-11-30 | Discharge: 2015-11-30 | Disposition: A | Discharge status: Home / Self Care | Payer: BLUE CROSS/BLUE SHIELD | Source: Ambulatory Visit | Attending: Family Medicine | Admitting: Family Medicine

## 2015-11-30 DIAGNOSIS — R634 Abnormal weight loss: Secondary | ICD-10-CM

## 2015-11-30 DIAGNOSIS — R911 Solitary pulmonary nodule: Secondary | ICD-10-CM | POA: Insufficient documentation

## 2015-11-30 MED ORDER — IOHEXOL 300 MG/ML  SOLN
75.0000 mL | Freq: Once | INTRAMUSCULAR | Status: AC | PRN
  Administered 2015-11-30: 75 mL via INTRAVENOUS

## 2015-11-30 NOTE — Telephone Encounter (Signed)
Pt advised.   Thanks,   -Giovannie Scerbo  

## 2015-11-30 NOTE — Telephone Encounter (Signed)
-----   Message from Margarita Rana, MD sent at 11/30/2015 10:39 AM EST ----- Normal CT scan. Please notify patient. Thanks.

## 2015-12-01 ENCOUNTER — Ambulatory Visit
Admission: RE | Admit: 2015-12-01 | Discharge: 2015-12-01 | Disposition: A | Discharge status: Home / Self Care | Payer: BLUE CROSS/BLUE SHIELD | Source: Ambulatory Visit | Attending: Physical Medicine and Rehabilitation | Admitting: Physical Medicine and Rehabilitation

## 2015-12-01 DIAGNOSIS — R937 Abnormal findings on diagnostic imaging of other parts of musculoskeletal system: Secondary | ICD-10-CM | POA: Diagnosis not present

## 2015-12-01 DIAGNOSIS — M25551 Pain in right hip: Secondary | ICD-10-CM | POA: Diagnosis not present

## 2015-12-13 ENCOUNTER — Encounter: Payer: Self-pay | Admitting: Physician Assistant

## 2015-12-13 ENCOUNTER — Ambulatory Visit (INDEPENDENT_AMBULATORY_CARE_PROVIDER_SITE_OTHER): Payer: BLUE CROSS/BLUE SHIELD | Admitting: Physician Assistant

## 2015-12-13 VITALS — BP 90/58 | HR 80 | Temp 98.2°F | Resp 16 | Wt 119.0 lb

## 2015-12-13 DIAGNOSIS — J101 Influenza due to other identified influenza virus with other respiratory manifestations: Secondary | ICD-10-CM

## 2015-12-13 DIAGNOSIS — R1011 Right upper quadrant pain: Secondary | ICD-10-CM | POA: Diagnosis not present

## 2015-12-13 DIAGNOSIS — R197 Diarrhea, unspecified: Secondary | ICD-10-CM | POA: Diagnosis not present

## 2015-12-13 DIAGNOSIS — R6889 Other general symptoms and signs: Secondary | ICD-10-CM

## 2015-12-13 LAB — POCT INFLUENZA A/B
INFLUENZA A, POC: POSITIVE — AB
Influenza B, POC: NEGATIVE

## 2015-12-13 MED ORDER — OSELTAMIVIR PHOSPHATE 75 MG PO CAPS: 75.0000 mg | ORAL_CAPSULE | Freq: Two times a day (BID) | ORAL | Status: DC

## 2015-12-13 NOTE — Progress Notes (Signed)
Patient: Krystal Simpson Female    DOB: Jan 15, 1961   55 y.o.   MRN: [id number] Visit Date: 12/13/2015  Today's Provider: Mar Daring, PA-C   Chief Complaint  Patient presents with  . Abdominal Pain  . Fever   Subjective:    Abdominal Pain This is a new problem. The current episode started in the past 7 days (Started on Friday). The onset quality is sudden. The problem occurs constantly. The problem has been gradually worsening. The pain is at a severity of 8/10 (Last night 8/10 and right now is 4/10). The quality of the pain is aching and tearing. The abdominal pain radiates to the RUQ and RLQ. Associated symptoms include belching (some), diarrhea (Developed diarrhea Friday and it lasted until 2 am. ) and a fever. Pertinent negatives include no dysuria, headaches, hematuria, nausea or vomiting. The pain is aggravated by belching, eating and movement. The pain is relieved by nothing. Treatments tried: Miralax she takes mostly everyday. The treatment provided no relief.  Fever  This is a new problem. The current episode started yesterday. The problem has been unchanged. The maximum temperature noted was 99 to 99.9 F (99.5 last night). The temperature was taken using an oral thermometer. Associated symptoms include abdominal pain, congestion, coughing (dry cough), diarrhea (Developed diarrhea Friday and it lasted until 2 am. ) and sleepiness. Pertinent negatives include no chest pain, ear pain, headaches, muscle aches, nausea, rash, sore throat (scratchy throat), urinary pain, vomiting or wheezing. She has tried nothing for the symptoms.   She has had the right sided flank pain for a long time.  Recently underwent CT abd/pelvis and CT chest.  Results showed hepatic cyst and a distended gallbladder.   URI symptoms started yesterday (12/12/15). She has been around sick contacts including her son.    Allergies  Allergen Reactions  . Ciprofloxacin     Other reaction(s): Joint Pains    . Tetanus Toxoids Hives   Previous Medications   BLACK COHOSH 160 MG CAPS    Take 1 capsule (160 mg total) by mouth daily.   CHOLECALCIFEROL 1000 UNITS CAPSULE    Take 1,000 Units by mouth 2 (two) times daily.    CLONAZEPAM (KLONOPIN) 0.25 MG DISINTEGRATING TABLET       CYANOCOBALAMIN PO    Take 2 tablets by mouth daily.    CYCLOBENZAPRINE (FLEXERIL) 5 MG TABLET    Reported on 12/13/2015   DICLOFENAC (VOLTAREN) 75 MG EC TABLET    Take 75 mg by mouth daily.    DICLOFENAC SODIUM (VOLTAREN) 1 % GEL    Apply 2 g topically 4 (four) times daily.   DULOXETINE (CYMBALTA) 60 MG CAPSULE    Take 60 mg by mouth daily.    FLAXSEED, LINSEED, (FLAX SEEDS PO)    Take 1 capsule by mouth daily.   GABAPENTIN (NEURONTIN) 300 MG CAPSULE    1 po qHS x 7 days, then 2 po qHS   HYDROCODONE-ACETAMINOPHEN (NORCO) 10-325 MG TABLET    Take 1 tablet by mouth every 6 (six) hours as needed.   MELOXICAM (MOBIC) 15 MG TABLET    Take 1 tablet (15 mg total) by mouth daily.   MULTIPLE VITAMINS-MINERALS PO    Take 1 tablet by mouth daily.    OMEGA-3 FATTY ACIDS (FISH OIL) 1200 MG CAPS    Take 2 capsules by mouth 2 (two) times daily.    POLYETHYLENE GLYCOL POWDER (MIRALAX) POWDER    Take 1  Container by mouth once.   PROMETHAZINE (PHENERGAN) 25 MG TABLET    TAKE 1 TABLET (25 MG TOTAL) BY MOUTH EVERY 6 (SIX) HOURS AS NEEDED FOR NAUSEA.   SERTRALINE (ZOLOFT) 100 MG TABLET    Take 100 mg by mouth 2 (two) times daily.    SUMATRIPTAN (IMITREX) 100 MG TABLET    TAKE 1 AT HEADACHE ONSET, MAY REPEAT AFTER 2 HOURS, NO MORE THAN 2 IN A DAY,NO MORE THAN 4 IN A WEEK   SUMATRIPTAN (IMITREX) 100 MG TABLET       TETRAHYDROZOLINE HCL (EYE DROPS OP)    Apply 1 drop to eye 2 (two) times daily.    TOPIRAMATE (TOPAMAX) 25 MG TABLET    Take 25 mg by mouth daily. 3 tablets at bedtime   TRAZODONE (DESYREL) 50 MG TABLET    50 mg at bedtime as needed. Reported on 10/04/2015   TRIAMCINOLONE (NASACORT) 55 MCG/ACT AERO NASAL INHALER    Place 2 sprays into  the nose daily.    Review of Systems  Constitutional: Positive for fever. Negative for chills and fatigue.  HENT: Positive for congestion. Negative for ear pain, postnasal drip, rhinorrhea, sinus pressure, sneezing, sore throat (scratchy throat) and trouble swallowing.   Respiratory: Positive for cough (dry cough). Negative for chest tightness and wheezing.   Cardiovascular: Negative for chest pain.  Gastrointestinal: Positive for abdominal pain and diarrhea (Developed diarrhea Friday and it lasted until 2 am. ). Negative for nausea and vomiting.  Genitourinary: Negative for dysuria and hematuria.  Skin: Negative for rash.  Neurological: Negative for dizziness and headaches.    Social History  Substance Use Topics  . Smoking status: Never Smoker   . Smokeless tobacco: Never Used  . Alcohol Use: No   Objective:   BP 90/58 mmHg  Pulse 80  Temp(Src) 98.2 F (36.8 C) (Oral)  Resp 16  Wt 119 lb (53.978 kg)  Physical Exam  Constitutional: She is oriented to person, place, and time. She appears well-developed and well-nourished. No distress.  HENT:  Head: Normocephalic and atraumatic.  Right Ear: Hearing, tympanic membrane, external ear and ear canal normal.  Left Ear: Hearing, tympanic membrane, external ear and ear canal normal.  Nose: Mucosal edema and rhinorrhea present. Right sinus exhibits no maxillary sinus tenderness and no frontal sinus tenderness. Left sinus exhibits no maxillary sinus tenderness and no frontal sinus tenderness.  Mouth/Throat: Uvula is midline, oropharynx is clear and moist and mucous membranes are normal. No oropharyngeal exudate, posterior oropharyngeal edema or posterior oropharyngeal erythema.  Eyes: Conjunctivae are normal. Pupils are equal, round, and reactive to light. Right eye exhibits no discharge. Left eye exhibits no discharge. No scleral icterus.  Neck: Normal range of motion. Neck supple. No tracheal deviation present. No thyromegaly present.   Cardiovascular: Normal rate, regular rhythm and normal heart sounds.  Exam reveals no gallop and no friction rub.   No murmur heard. Pulmonary/Chest: Effort normal and breath sounds normal. No stridor. No respiratory distress. She has no wheezes. She has no rales.  Abdominal: Soft. Normal appearance and bowel sounds are normal. She exhibits no distension and no mass. There is no hepatosplenomegaly. There is tenderness in the right upper quadrant, epigastric area and suprapubic area. There is no rebound, no guarding, no CVA tenderness, no tenderness at McBurney's point and negative Murphy's sign.  Lymphadenopathy:    She has no cervical adenopathy.  Neurological: She is alert and oriented to person, place, and time.  Skin: Skin is warm  and dry. She is not diaphoretic.  Vitals reviewed.       Assessment & Plan:     1. Influenza A She did have her flu vaccine this year, but was curious because her son has been so sick. She did test positive for Influenza A. Will treat with tamiflu as below since symptoms started yesterday. Advised to take tylenol and IBU alternating for fevers and body aches.  Stay well hydrated. Call if symptoms fail to improve or worsen. - oseltamivir (TAMIFLU) 75 MG capsule; Take 1 capsule (75 mg total) by mouth 2 (two) times daily.  Dispense: 10 capsule; Refill: 0  2. RUQ pain RUQ pain with recent diarrhea on Thursday/Friday. H/O constipation for chronic narcotic pain medication use and on miralax as needed. Advised to continue miralax for constipation relieve as she has not had a BM in the last 4 days. Still able to pass gas. Also had distended gallbladder noted on CT abd/pelvis that has not been addressed.  This may be cause of RUQ and epigastric pain and bloating. Will f/u pending results. - US Abdomen Limited RUQ; Future  3. Diarrhea, unspecified type Resolved. See above medical treatment plan. - US Abdomen Limited RUQ; Future  4. Flu-like symptoms Positive  Influenza A. - POCT Influenza A/B       Mar Daring, PA-C  [facility]

## 2015-12-13 NOTE — Patient Instructions (Addendum)
Cholecystitis Cholecystitis is inflammation of the gallbladder. It is often called a gallbladder attack. The gallbladder is a pear-shaped organ that lies beneath the liver on the right side of the body. The gallbladder stores bile, which is a fluid that helps the body to digest fats. If bile builds up in your gallbladder, your gallbladder becomes inflamed. This condition may occur suddenly (be acute). Repeat episodes of acute cholecystitis or prolonged episodes may lead to a long-term (chronic) condition. Cholecystitis is serious and it requires treatment.  CAUSES The most common cause of this condition is gallstones. Gallstones can block the tube (duct) that carries bile out of your gallbladder. This causes bile to build up. Other causes of this condition include:  Damage to the gallbladder due to a decrease in blood flow.  Infections in the bile ducts.  Scars or kinks in the bile ducts.  Tumors in the liver, pancreas, or gallbladder. RISK FACTORS This condition is more likely to develop in:  People who have sickle cell disease.  People who take birth control pills or use estrogen.  People who have alcoholic liver disease.  People who have liver cirrhosis.  People who have their nutrition delivered through a vein (parenteral nutrition).  People who do not eat or drink (do fasting) for a long period of time.  People who are obese.  People who have rapid weight loss.  People who are pregnant.  People who have increased triglyceride levels.  People who have pancreatitis. SYMPTOMS Symptoms of this condition include:  Abdominal pain, especially in the upper right area of the abdomen.  Abdominal tenderness or bloating.  Nausea.  Vomiting.  Fever.  Chills.  Yellowing of the skin and the whites of the eyes (jaundice). DIAGNOSIS This condition is diagnosed with a medical history and physical exam. You may also have other tests, including:  Imaging tests, such as:  An  ultrasound of the gallbladder.  A CT scan of the abdomen.  A gallbladder nuclear scan (HIDA scan). This scan allows your health care provider to see the bile moving from your liver to your gallbladder and to your small intestine.  MRI.  Blood tests, such as:  A complete blood count, because the white blood cell count may be higher than normal.  Liver function tests, because some levels may be higher than normal with certain types of gallstones. TREATMENT Treatment may include:  Fasting for a certain amount of time.  IV fluids.  Medicine to treat pain or vomiting.  Antibiotic medicine.  Surgery to remove your gallbladder (cholecystectomy). This may happen immediately or at a later time. HOME CARE INSTRUCTIONS Home care will depend on your treatment. In general:  Take over-the-counter and prescription medicines only as told by your health care provider.  If you were prescribed an antibiotic medicine, take it as told by your health care provider. Do not stop taking the antibiotic even if you start to feel better.  Follow instructions from your health care provider about what to eat or drink. When you are allowed to eat, avoid eating or drinking anything that triggers your symptoms.  Keep all follow-up visits as told by your health care provider. This is important. SEEK MEDICAL CARE IF:  Your pain is not controlled with medicine.  You have a fever. SEEK IMMEDIATE MEDICAL CARE IF:  Your pain moves to another part of your abdomen or to your back.  You continue to have symptoms or you develop new symptoms even with treatment.   This information   is not intended to replace advice given to you by your health care provider. Make sure you discuss any questions you have with your health care provider.   Document Released: 09/09/2005 Document Revised: 05/31/2015 Document Reviewed: 12/21/2014 Elsevier Interactive Patient Education 2016 Elsevier Inc[employer] Abdominal Pain, Adult Many  things can cause abdominal pain. Usually, abdominal pain is not caused by a disease and will improve without treatment. It can often be observed and treated at home. Your health care provider will do a physical exam and possibly order blood tests and X-rays to help determine the seriousness of your pain. However, in many cases, more time must pass before a clear cause of the pain can be found. Before that point, your health care provider may not know if you need more testing or further treatment. HOME CARE INSTRUCTIONS Monitor your abdominal pain for any changes. The following actions may help to alleviate any discomfort you are experiencing:  Only take over-the-counter or prescription medicines as directed by your health care provider.  Do not take laxatives unless directed to do so by your health care provider.  Try a clear liquid diet (broth, tea, or water) as directed by your health care provider. Slowly move to a bland diet as tolerated. SEEK MEDICAL CARE IF:  You have unexplained abdominal pain.  You have abdominal pain associated with nausea or diarrhea.  You have pain when you urinate or have a bowel movement.  You experience abdominal pain that wakes you in the night.  You have abdominal pain that is worsened or improved by eating food.  You have abdominal pain that is worsened with eating fatty foods.  You have a fever. SEEK IMMEDIATE MEDICAL CARE IF:  Your pain does not go away within 2 hours.  You keep throwing up (vomiting).  Your pain is felt only in portions of the abdomen, such as the right side or the left lower portion of the abdomen.  You pass bloody or black tarry stools. MAKE SURE YOU:  Understand these instructions.  Will watch your condition.  Will get help right away if you are not doing well or get worse.   This information is not intended to replace advice given to you by your health care provider. Make sure you discuss any questions you have with  your health care provider.   Document Released: 06/19/2005 Document Revised: 05/31/2015 Document Reviewed: 05/19/2013 Elsevier Interactive Patient Education 2016 Elsevier Inc.  Influenza, Adult Influenza ("the flu") is a viral infection of the respiratory tract. It occurs more often in winter months because people spend more time in close contact with one another. Influenza can make you feel very sick. Influenza easily spreads from person to person (contagious). CAUSES  Influenza is caused by a virus that infects the respiratory tract. You can catch the virus by breathing in droplets from an infected person's cough or sneeze. You can also catch the virus by touching something that was recently contaminated with the virus and then touching your mouth, nose, or eyes. RISKS AND COMPLICATIONS You may be at risk for a more severe case of influenza if you smoke cigarettes, have diabetes, have chronic heart disease (such as heart failure) or lung disease (such as asthma), or if you have a weakened immune system. Elderly people and pregnant women are also at risk for more serious infections. The most common problem of influenza is a lung infection (pneumonia). Sometimes, this problem can require emergency medical care and may be life threatening. SIGNS AND SYMPTOMS  Symptoms typically last 4 to 10 days and may include:  Fever.  Chills.  Headache, body aches, and muscle aches.  Sore throat.  Chest discomfort and cough.  Poor appetite.  Weakness or feeling tired.  Dizziness.  Nausea or vomiting. DIAGNOSIS  Diagnosis of influenza is often made based on your history and a physical exam. A nose or throat swab test can be done to confirm the diagnosis. TREATMENT  In mild cases, influenza goes away on its own. Treatment is directed at relieving symptoms. For more severe cases, your health care provider may prescribe antiviral medicines to shorten the sickness. Antibiotic medicines are not  effective because the infection is caused by a virus, not by bacteria. HOME CARE INSTRUCTIONS  Take medicines only as directed by your health care provider.  Use a cool mist humidifier to make breathing easier.  Get plenty of rest until your temperature returns to normal. This usually takes 3 to 4 days.  Drink enough fluid to keep your urine clear or pale yellow.  Cover yourmouth and nosewhen coughing or sneezing,and wash your handswellto prevent thevirusfrom spreading.  Stay homefromwork orschool untilthe fever is gonefor at least 56full day. PREVENTION  An annual influenza vaccination (flu shot) is the best way to avoid getting influenza. An annual flu shot is now routinely recommended for all adults in the [place] IF:  You experiencechest pain, yourcough worsens,or you producemore mucus.  Youhave nausea,vomiting, ordiarrhea.  Your fever returns or gets worse. SEEK IMMEDIATE MEDICAL CARE IF:  You havetrouble breathing, you become short of breath,or your skin ornails becomebluish.  You have severe painor stiffnessin the neck.  You develop a sudden headache, or pain in the face or ear.  You have nausea or vomiting that you cannot control. MAKE SURE YOU:   Understand these instructions.  Will watch your condition.  Will get help right away if you are not doing well or get worse.   This information is not intended to replace advice given to you by your health care provider. Make sure you discuss any questions you have with your health care provider.   Document Released: 09/06/2000 Document Revised: 09/30/2014 Document Reviewed: 12/09/2011 Elsevier Interactive Patient Education [employer].

## 2015-12-18 ENCOUNTER — Telehealth: Payer: Self-pay | Admitting: Family Medicine

## 2015-12-18 DIAGNOSIS — J069 Acute upper respiratory infection, unspecified: Secondary | ICD-10-CM

## 2015-12-18 MED ORDER — AZITHROMYCIN 250 MG PO TABS: ORAL_TABLET | ORAL | Status: DC

## 2015-12-18 NOTE — Telephone Encounter (Signed)
Patient advised as below. Patient reports her cough and chest congestion is worse and would like something called into pharmacy to help with cough and break up mucus. Patient reports that she takes OTC Muccinex DM and uses her nasal spray daily. Patient denies fever.

## 2015-12-18 NOTE — Telephone Encounter (Signed)
Sent in zpak. If no improvement will need to be seen again.

## 2015-12-18 NOTE — Telephone Encounter (Signed)
Patient advised as below.  

## 2015-12-18 NOTE — Telephone Encounter (Signed)
Pt was in last week with the flu. She is now coughing up green and has a lot of congestion.  She does not feel like coming back in but would like for youto call in something at the pharmacy.  D[facility] [address].  Her call back is [phone]  [place]

## 2015-12-18 NOTE — Telephone Encounter (Signed)
This is probably still part of the flu. She needs to continue symptomatic relief. If symptoms continue or if symptoms worsen or if she develops a fever again she will need to be re-evaluated.

## 2015-12-19 ENCOUNTER — Ambulatory Visit: Payer: BLUE CROSS/BLUE SHIELD

## 2015-12-22 ENCOUNTER — Other Ambulatory Visit: Payer: Self-pay

## 2015-12-22 DIAGNOSIS — M7662 Achilles tendinitis, left leg: Principal | ICD-10-CM

## 2015-12-22 DIAGNOSIS — M7661 Achilles tendinitis, right leg: Secondary | ICD-10-CM

## 2015-12-22 MED ORDER — HYDROCODONE-ACETAMINOPHEN 10-325 MG PO TABS: 1.0000 | ORAL_TABLET | Freq: Four times a day (QID) | ORAL | Status: DC | PRN

## 2015-12-22 NOTE — Telephone Encounter (Signed)
Patient requesting refill. 

## 2015-12-25 ENCOUNTER — Ambulatory Visit
Admission: RE | Admit: 2015-12-25 | Discharge: 2015-12-25 | Disposition: A | Discharge status: Home / Self Care | Payer: BLUE CROSS/BLUE SHIELD | Source: Ambulatory Visit | Attending: Physician Assistant | Admitting: Physician Assistant

## 2015-12-25 ENCOUNTER — Telehealth: Payer: Self-pay

## 2015-12-25 DIAGNOSIS — R1011 Right upper quadrant pain: Secondary | ICD-10-CM | POA: Insufficient documentation

## 2015-12-25 DIAGNOSIS — K7689 Other specified diseases of liver: Secondary | ICD-10-CM | POA: Diagnosis not present

## 2015-12-25 DIAGNOSIS — R197 Diarrhea, unspecified: Secondary | ICD-10-CM

## 2015-12-25 NOTE — Telephone Encounter (Signed)
Patent advised as directed below. Patient verbalized understanding.

## 2015-12-25 NOTE — Telephone Encounter (Signed)
-----   Message from Mar Daring, PA-C sent at 12/25/2015 10:08 AM EDT ----- Normal [place]. There are liver cyst noted but these have been visualized on previous CT.

## 2015-12-29 ENCOUNTER — Encounter: Payer: Self-pay | Admitting: Physician Assistant

## 2015-12-29 ENCOUNTER — Ambulatory Visit (INDEPENDENT_AMBULATORY_CARE_PROVIDER_SITE_OTHER): Payer: BLUE CROSS/BLUE SHIELD | Admitting: Physician Assistant

## 2015-12-29 VITALS — BP 94/60 | HR 66 | Temp 98.1°F | Resp 16 | Wt 119.2 lb

## 2015-12-29 DIAGNOSIS — K7689 Other specified diseases of liver: Secondary | ICD-10-CM | POA: Diagnosis not present

## 2015-12-29 DIAGNOSIS — M546 Pain in thoracic spine: Secondary | ICD-10-CM

## 2015-12-29 NOTE — Patient Instructions (Signed)

## 2015-12-29 NOTE — Progress Notes (Signed)
Patient: Krystal Simpson Female    DOB: 02-15-1961   55 y.o.   MRN: [id number] Visit Date: 12/29/2015  Today's Provider: Mar Daring, PA-C   Chief Complaint  Patient presents with  . Abdominal Pain   Subjective:    HPI  Krystal Simpson is here with c/o persistent right side pain in the RUQ. She was seen 03/22 for the same pain. [place] was ordered and results were normal. She is also having lower back pain on the right side.There were liver cyst noted but they have been visualized on previous CT. Her pain quality is sore and aching. Today is much better. The pain severity has been 9/10 yesterday and today is 0/10 . The pain is aggravated by touching the area or pushing on it. Didn't take any pain medication this morning. She is not having any UTI symptoms.     Allergies  Allergen Reactions  . Ciprofloxacin     Other reaction(s): Joint Pains  . Tetanus Toxoids Hives   Previous Medications   AZITHROMYCIN (ZITHROMAX) 250 MG TABLET    Take 2 tablets PO on day one, and one tablet PO daily thereafter until completed.   BLACK COHOSH 160 MG CAPS    Take 1 capsule (160 mg total) by mouth daily.   CHOLECALCIFEROL 1000 UNITS CAPSULE    Take 1,000 Units by mouth 2 (two) times daily.    CLONAZEPAM (KLONOPIN) 0.25 MG DISINTEGRATING TABLET       CYANOCOBALAMIN PO    Take 2 tablets by mouth daily.    CYCLOBENZAPRINE (FLEXERIL) 5 MG TABLET    Reported on 12/13/2015   DICLOFENAC (VOLTAREN) 75 MG EC TABLET    Take 75 mg by mouth daily.    DICLOFENAC SODIUM (VOLTAREN) 1 % GEL    Apply 2 g topically 4 (four) times daily.   DULOXETINE (CYMBALTA) 60 MG CAPSULE    Take 60 mg by mouth daily.    FLAXSEED, LINSEED, (FLAX SEEDS PO)    Take 1 capsule by mouth daily.   GABAPENTIN (NEURONTIN) 300 MG CAPSULE    1 po qHS x 7 days, then 2 po qHS   HYDROCODONE-ACETAMINOPHEN (NORCO) 10-325 MG TABLET    Take 1 tablet by mouth every 6 (six) hours as needed.   MELOXICAM (MOBIC) 15 MG TABLET    Take 1 tablet (15  mg total) by mouth daily.   MULTIPLE VITAMINS-MINERALS PO    Take 1 tablet by mouth daily.    OMEGA-3 FATTY ACIDS (FISH OIL) 1200 MG CAPS    Take 2 capsules by mouth 2 (two) times daily.    OSELTAMIVIR (TAMIFLU) 75 MG CAPSULE    Take 1 capsule (75 mg total) by mouth 2 (two) times daily.   POLYETHYLENE GLYCOL POWDER (MIRALAX) POWDER    Take 1 Container by mouth once.   PROMETHAZINE (PHENERGAN) 25 MG TABLET    TAKE 1 TABLET (25 MG TOTAL) BY MOUTH EVERY 6 (SIX) HOURS AS NEEDED FOR NAUSEA.   SERTRALINE (ZOLOFT) 100 MG TABLET    Take 100 mg by mouth 2 (two) times daily.    SUMATRIPTAN (IMITREX) 100 MG TABLET    TAKE 1 AT HEADACHE ONSET, MAY REPEAT AFTER 2 HOURS, NO MORE THAN 2 IN A DAY,NO MORE THAN 4 IN A WEEK   TETRAHYDROZOLINE HCL (EYE DROPS OP)    Apply 1 drop to eye 2 (two) times daily.    TOPIRAMATE (TOPAMAX) 25 MG TABLET    Take  25 mg by mouth daily. 3 tablets at bedtime   TRAZODONE (DESYREL) 50 MG TABLET    50 mg at bedtime as needed. Reported on 10/04/2015   TRIAMCINOLONE (NASACORT) 55 MCG/ACT AERO NASAL INHALER    Place 2 sprays into the nose daily.    Review of Systems  Constitutional: Negative.   HENT: Negative.   Respiratory: Negative.   Cardiovascular: Negative for chest pain, palpitations and leg swelling.  Gastrointestinal: Positive for abdominal pain (RUQ). Negative for nausea, vomiting, diarrhea and constipation.  Genitourinary: Negative.  Negative for dysuria, urgency, frequency, hematuria and flank pain.  Musculoskeletal: Positive for back pain.  Neurological: Negative.     Social History  Substance Use Topics  . Smoking status: Never Smoker   . Smokeless tobacco: Never Used  . Alcohol Use: No   Objective:   BP 94/60 mmHg  Pulse 66  Temp(Src) 98.1 F (36.7 C) (Oral)  Resp 16  Wt 119 lb 3.2 oz (54.069 kg)  Physical Exam  Constitutional: She appears well-developed and well-nourished. No distress.  Neck: Normal range of motion. Neck supple. No JVD present. No  tracheal deviation present. No thyromegaly present.  Cardiovascular: Normal rate, regular rhythm and normal heart sounds.  Exam reveals no gallop and no friction rub.   No murmur heard. Pulmonary/Chest: Effort normal and breath sounds normal. No respiratory distress. She has no wheezes. She has no rales.  Abdominal: Soft. Bowel sounds are normal. She exhibits no distension and no mass. There is no tenderness. There is no rebound and no guarding.  Musculoskeletal:       Back:  Lymphadenopathy:    She has no cervical adenopathy.  Skin: She is not diaphoretic.  Vitals reviewed.       Assessment & Plan:     1. Hepatic cyst Will refer to GI for further evaluation of the hepatic cyst to make sure that is not cause of pain. She does see Dr. Sharlet Salina for her back pain. She does have L1-L2 disc protrusion but MRI did not show any nerve compression or foraminal narrowing. I do not know if this could be some radicular pain from that. I doubt it because her pain seems slighter higher than where this would be, but guess it should not be ruled out just in case. She will discuss this with Dr. Sharlet Salina when she follows up with him.  - Ambulatory referral to Gastroenterology  2. Right-sided thoracic back pain See above medical treatment plan. - Ambulatory referral to Gastroenterology       Mar Daring, PA-C  [facility]

## 2016-01-29 ENCOUNTER — Other Ambulatory Visit: Payer: Self-pay

## 2016-01-29 DIAGNOSIS — M7661 Achilles tendinitis, right leg: Secondary | ICD-10-CM

## 2016-01-29 DIAGNOSIS — M7662 Achilles tendinitis, left leg: Principal | ICD-10-CM

## 2016-01-29 MED ORDER — HYDROCODONE-ACETAMINOPHEN 10-325 MG PO TABS: 1.0000 | ORAL_TABLET | Freq: Four times a day (QID) | ORAL | Status: DC | PRN

## 2016-01-29 NOTE — Telephone Encounter (Signed)
Prescription printed. Please notify patient it is ready for pick up. Thanks- Dr. Vitor Overbaugh.  

## 2016-02-21 ENCOUNTER — Other Ambulatory Visit: Payer: Self-pay

## 2016-02-21 DIAGNOSIS — M7662 Achilles tendinitis, left leg: Principal | ICD-10-CM

## 2016-02-21 DIAGNOSIS — M7661 Achilles tendinitis, right leg: Secondary | ICD-10-CM

## 2016-02-21 MED ORDER — HYDROCODONE-ACETAMINOPHEN 10-325 MG PO TABS: 1.0000 | ORAL_TABLET | Freq: Four times a day (QID) | ORAL | Status: DC | PRN

## 2016-02-21 NOTE — Telephone Encounter (Signed)
Prescription printed. Please notify patient it is ready for pick up. Thanks- Dr. Emmaus Brandi.  

## 2016-03-08 ENCOUNTER — Other Ambulatory Visit: Payer: Self-pay

## 2016-03-08 MED ORDER — DICLOFENAC SODIUM 75 MG PO TBEC: 75.0000 mg | DELAYED_RELEASE_TABLET | Freq: Two times a day (BID) | ORAL | Status: DC

## 2016-03-25 ENCOUNTER — Other Ambulatory Visit: Payer: Self-pay | Admitting: Family Medicine

## 2016-03-25 DIAGNOSIS — M7662 Achilles tendinitis, left leg: Principal | ICD-10-CM

## 2016-03-25 DIAGNOSIS — M7661 Achilles tendinitis, right leg: Secondary | ICD-10-CM

## 2016-03-25 MED ORDER — HYDROCODONE-ACETAMINOPHEN 10-325 MG PO TABS: 1.0000 | ORAL_TABLET | Freq: Four times a day (QID) | ORAL | Status: DC | PRN

## 2016-03-25 NOTE — Telephone Encounter (Signed)
Rx printed and will be up front for pick up.

## 2016-03-25 NOTE — Telephone Encounter (Signed)
Patient informed.  Thanks,  -Joseline

## 2016-03-25 NOTE — Telephone Encounter (Signed)
Patient is requesting refill on   HYDROcodone-acetaminophen (NORCO) 10-325 MG tablet      cb # [phone]  Patient is completely out

## 2016-03-27 ENCOUNTER — Other Ambulatory Visit: Payer: Self-pay | Admitting: Physician Assistant

## 2016-03-27 DIAGNOSIS — Z1231 Encounter for screening mammogram for malignant neoplasm of breast: Secondary | ICD-10-CM

## 2016-04-04 ENCOUNTER — Encounter: Payer: Self-pay | Admitting: Podiatry

## 2016-04-04 ENCOUNTER — Ambulatory Visit (INDEPENDENT_AMBULATORY_CARE_PROVIDER_SITE_OTHER): Payer: BLUE CROSS/BLUE SHIELD | Admitting: Podiatry

## 2016-04-04 VITALS — BP 113/64 | HR 58 | Resp 16 | Ht 64.5 in | Wt 121.0 lb

## 2016-04-04 DIAGNOSIS — B351 Tinea unguium: Secondary | ICD-10-CM

## 2016-04-04 NOTE — Progress Notes (Signed)
   Subjective:    Patient ID: Krystal Simpson, female    DOB: April 20, 1961, 55 y.o.   MRN: [id number]  HPI Chief Complaint  Patient presents with  . Nail Problem    Bilateral; great toes; nail discoloration & thickened nails; Left foot-great toe-medial side; pt stated,"Hurts to touch nail"; x26 yr   55 year old female presents the office if or concerns of both of her big toenails become discolored and thickened and the left one is painful at times the tip of the toenail. Denies any redness or drainage or any swelling. She said no previous treatment to this. She does relate a history to the toenail several years ago and since then has become thick and discolored. No other concerns today.   Review of Systems  All other systems reviewed and are negative.      Objective:   Physical Exam General: AAO x3, NAD  Dermatological: I'll hallux nails hypertrophic, dystrophic, discolored and on the left side somewhat loosely underlying nailbed distally but from a here proximally. Unable to elicit any tenderness in nails today. No edema, erythema, drainage or pus. No clinical signs of infection at this time.  Vascular: Dorsalis Pedis artery and Posterior Tibial artery pedal pulses are 2/4 bilateral with immedate capillary fill time. Pedal hair growth present.There is no pain with calf compression, swelling, warmth, erythema.   Neruologic: Grossly intact via light touch bilateral. Vibratory intact via tuning fork bilateral. Protective threshold with Semmes Wienstein monofilament intact to all pedal sites bilateral.  No Babinski or clonus noted bilateral.   Musculoskeletal: No tenderness bilateral lower extremities. No gross boney pedal deformities bilateral. No pain, crepitus, or limitation noted with foot and ankle range of motion bilateral. Muscular strength 5/5 in all groups tested bilateral.  Gait: Unassisted, Nonantalgic.      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 I discussed third total nail avulsion versus debridement of the nail culture.  -Today the nails bilateral hallux were debrided and sent for culture. Upon debridement of the nail there is no pain to the toenails. If any point she continues to have pain in the nail or any signs or symptoms of infection  discussed third total nail avulsion. -Went ahead and ordered a compound cream for onychomycosis. -Follow-up in 6 weeks or sooner if any problems arise. In the meantime, encouraged to call the office with any questions, concerns, change in symptoms.   Celesta Gentile, DPM

## 2016-04-05 MED ORDER — NONFORMULARY OR COMPOUNDED ITEM: Status: DC

## 2016-04-10 ENCOUNTER — Ambulatory Visit (INDEPENDENT_AMBULATORY_CARE_PROVIDER_SITE_OTHER): Payer: BLUE CROSS/BLUE SHIELD | Admitting: Sports Medicine

## 2016-04-10 ENCOUNTER — Encounter: Payer: Self-pay | Admitting: Sports Medicine

## 2016-04-10 VITALS — BP 104/60 | Ht 65.0 in | Wt 124.0 lb

## 2016-04-10 DIAGNOSIS — M7661 Achilles tendinitis, right leg: Secondary | ICD-10-CM | POA: Diagnosis not present

## 2016-04-10 DIAGNOSIS — M7662 Achilles tendinitis, left leg: Secondary | ICD-10-CM

## 2016-04-10 NOTE — Progress Notes (Signed)
  Krystal Simpson - 55 y.o. female MRN [id number]  Date of birth: May 10, 1961  SUBJECTIVE:  Including CC & ROS.  Chief Complaint  Patient presents with  . Ankle Pain    Achilles tendinitis bilaterally: She initially presented with acutely's tendinitis after being prescribed ciprofloxacin. Since that time she has been performing exercises and stretches and had noticed improvement in her symptoms. She is also been wearing body helix compression on both ankles. Today she reports that she is having some right Achilles pain  She seems to notice a pain if she does not do her exercises on a regular basis. She does have some soreness during the middle of the walk. The pain is localized in her right achilles.  The pain can get to a 6 out of 10. The pain is significantly better than what it was last October. She denies having any knots in her Achilles any further.  ROS: No unexpected weight loss, fever, chills, swelling, instability, muscle pain, numbness/tingling, redness, otherwise see HPI   HISTORY: Past Medical, Surgical, Social, and Family History Reviewed & Updated per EMR.   Pertinent Historical Findings include: PMSHx -  BPPV, depression, basal cell carcinoma, GERD, HLD, sq cell carcinoma,  PSHx -  No alcohol or tobacco, currently not working FHx -  Mother with emphysema  Medications - Klonopin, Cymbalta, Phenergan, Zoloft, Imitrex, trazodone, Nasacort  DATA REVIEWED: None to review   PHYSICAL EXAM:  VS: BP:104/60 mmHg  HR: bpm  TEMP: ( )  RESP:   HT:5\' 5"  (165.1 cm)   WT:124 lb (56.246 kg)  BMI:20.7 PHYSICAL EXAM: Gen: NAD, alert, cooperative with exam, well-appearing HEENT: clear conjunctiva,  CV:  no edema, capillary refill brisk, normal rate Resp: non-labored Skin: no rashes, normal turgor  Neuro: no gross deficits.  Psych:  alert and oriented Right Achilles: Noticeably bigger compared to the left Achilles Some pain to palpation at 4 cm proximally to the insertion of  Achilles. No pain to palpation at insertion of Achilles. Sensation: intact Vascular: intact w/ dorsalis pedis & posterior tibialis pulses 2+ Able to walk 4 steps   Limited ultrasound: Achilles, left and right: Left Achilles measuring 0.4 cm at the insertion in thickness. Measures 0.7 cm at thickest portion of tendon. No hypoechoic changes within the tendon and no retrocalcaneal bursitis. Right Achilles measuring 0.5 cm at insertion in thickness. Measures 0.8 cm at thickest portion of tendon. No hypoechoic changes within the tendon and no retrocalcaneal bursitis.  ASSESSMENT & PLAN:   Achilles tendonitis, bilateral Nodules have completely resolved and having improvement of left Achilles and measurements today. Measurements from March of left Achilles were 1.03 cm at thickest, which today shows 0.7 cm. Having some pain with right Achilles but overall much improved. - Provided heel lifts in 2 of her shoes today. - She will obtained to new body helix for left and right ankle. - She will need to be maintained on her exercises 3 times per week. - Advised that she follow-up in 6 months

## 2016-04-10 NOTE — Patient Instructions (Signed)
Thank you for coming in,      Please feel free to call with any questions or concerns at any time, at [phone]. --Dr. Raeford Razor

## 2016-04-10 NOTE — Assessment & Plan Note (Signed)
Nodules have completely resolved and having improvement of left Achilles and measurements today. Measurements from March of left Achilles were 1.03 cm at thickest, which today shows 0.7 cm. Having some pain with right Achilles but overall much improved. - Provided heel lifts in 2 of her shoes today. - She will obtained to new body helix for left and right ankle. - She will need to be maintained on her exercises 3 times per week. - Advised that she follow-up in 6 months

## 2016-04-22 ENCOUNTER — Other Ambulatory Visit: Payer: Self-pay | Admitting: Physical Medicine and Rehabilitation

## 2016-04-22 ENCOUNTER — Telehealth: Payer: Self-pay

## 2016-04-22 ENCOUNTER — Other Ambulatory Visit: Payer: Self-pay

## 2016-04-22 DIAGNOSIS — M5414 Radiculopathy, thoracic region: Secondary | ICD-10-CM

## 2016-04-22 DIAGNOSIS — M7661 Achilles tendinitis, right leg: Secondary | ICD-10-CM

## 2016-04-22 DIAGNOSIS — M7662 Achilles tendinitis, left leg: Principal | ICD-10-CM

## 2016-04-22 MED ORDER — HYDROCODONE-ACETAMINOPHEN 10-325 MG PO TABS: 1.0000 | ORAL_TABLET | Freq: Four times a day (QID) | ORAL | 0 refills | Status: DC | PRN

## 2016-04-22 NOTE — Telephone Encounter (Signed)
LM that prescription is ready for pick up.  Thanks,  -Joseline 

## 2016-04-22 NOTE — Telephone Encounter (Signed)
Patient called requesting refill on medication. Contact info is correct. Thanks!

## 2016-04-24 ENCOUNTER — Other Ambulatory Visit: Payer: Self-pay | Admitting: Otolaryngology

## 2016-04-24 DIAGNOSIS — H9201 Otalgia, right ear: Secondary | ICD-10-CM

## 2016-04-26 NOTE — Telephone Encounter (Signed)
error 

## 2016-05-02 ENCOUNTER — Ambulatory Visit
Admission: RE | Admit: 2016-05-02 | Discharge: 2016-05-02 | Disposition: A | Discharge status: Home / Self Care | Payer: BLUE CROSS/BLUE SHIELD | Source: Ambulatory Visit | Attending: Physical Medicine and Rehabilitation | Admitting: Physical Medicine and Rehabilitation

## 2016-05-02 DIAGNOSIS — M5414 Radiculopathy, thoracic region: Secondary | ICD-10-CM

## 2016-05-02 DIAGNOSIS — G959 Disease of spinal cord, unspecified: Secondary | ICD-10-CM | POA: Insufficient documentation

## 2016-05-02 DIAGNOSIS — M1288 Other specific arthropathies, not elsewhere classified, other specified site: Secondary | ICD-10-CM | POA: Diagnosis not present

## 2016-05-04 ENCOUNTER — Ambulatory Visit
Admission: RE | Admit: 2016-05-04 | Discharge: 2016-05-04 | Disposition: A | Discharge status: Home / Self Care | Payer: BLUE CROSS/BLUE SHIELD | Source: Ambulatory Visit | Attending: Otolaryngology | Admitting: Otolaryngology

## 2016-05-04 DIAGNOSIS — H9201 Otalgia, right ear: Secondary | ICD-10-CM

## 2016-05-04 DIAGNOSIS — H9203 Otalgia, bilateral: Secondary | ICD-10-CM | POA: Insufficient documentation

## 2016-05-04 DIAGNOSIS — R930 Abnormal findings on diagnostic imaging of skull and head, not elsewhere classified: Secondary | ICD-10-CM | POA: Insufficient documentation

## 2016-05-04 MED ORDER — GADOBENATE DIMEGLUMINE 529 MG/ML IV SOLN
11.0000 mL | Freq: Once | INTRAVENOUS | Status: AC | PRN
  Administered 2016-05-04: 11 mL via INTRAVENOUS

## 2016-05-07 DIAGNOSIS — B351 Tinea unguium: Secondary | ICD-10-CM

## 2016-05-16 ENCOUNTER — Encounter: Payer: Self-pay | Admitting: Podiatry

## 2016-05-16 ENCOUNTER — Ambulatory Visit (INDEPENDENT_AMBULATORY_CARE_PROVIDER_SITE_OTHER): Payer: BLUE CROSS/BLUE SHIELD | Admitting: Podiatry

## 2016-05-16 DIAGNOSIS — M79675 Pain in left toe(s): Secondary | ICD-10-CM | POA: Diagnosis not present

## 2016-05-16 DIAGNOSIS — B351 Tinea unguium: Secondary | ICD-10-CM

## 2016-05-16 DIAGNOSIS — L600 Ingrowing nail: Secondary | ICD-10-CM

## 2016-05-16 MED ORDER — TERBINAFINE HCL 250 MG PO TABS: 250.0000 mg | ORAL_TABLET | Freq: Every day | ORAL | 0 refills | Status: DC

## 2016-05-16 NOTE — Progress Notes (Signed)
Subjective: 55 year old female presents to the office today discussed nail culture results. She's been using a compound ointment for nail fungus. She sits left big toenail is tender with pressure is going in monocytes. Denies any redness or drainage coming from the nail. Denies any systemic complaints such as fevers, chills, nausea, vomiting. No acute changes since last appointment, and no other complaints at this time.   Objective: AAO x3, NAD DP/PT pulses palpable bilaterally, CRT less than 3 seconds Nails are hypertrophic, dystrophic, discolored particularly left hallux toenail. Left hallux toenail is increased from both medial and lateral nail borders. There is localized edema on the nail borders and there is tenderness the entire toenail. There is ridging horizontally within the toenail due to damage. There is no drainage or pus. No edema, erythema, increase in warmth to bilateral lower extremities.  No open lesions or pre-ulcerative lesions.  No pain with calf compression, swelling, warmth, erythema  Assessment: Onychomycosis with symptomatic onychomycosis left hallux toenail  Plan: -All treatment options discussed with the patient including all alternatives, risks, complications.  -Nail culture results were discussed the patient wishes revealed onychomycosis. She wishes to proceed oral therapy in conjunction with topical. Ordered Lamisil discussed side effects the medication. Ordered LFTs and CBC however she was did not start the medication since I call her with the results the blood work and she rash to this. -Due to the pain in the left hallux toenail recommended total nail avulsion. She wishes to proceed with this. Under sterile conditions a mixture of lidocaine and Marcaine was infiltrated in the left hallux toenail. Once anesthetized the skin was prepped in sterile fashion. Tourniquet was applied. Left hallux toenail was excised in total measured to remove all offending nail borders. No  pus. Hemostasis was achieved and area was irrigated. Silvadene applied followed by dressing. Tourniquet release and there is found to be an immediate capillary refill time to all the digit. She tolerated this well without couple complications. Post injection care was discussed. Follow-up in 1-2 weeks for nail check or sooner if needed. -Patient encouraged to call the office with any questions, concerns, change in symptoms.   Celesta Gentile, DPM

## 2016-05-16 NOTE — Patient Instructions (Addendum)
Soak Instructions    THE DAY AFTER THE PROCEDURE  Place 1/4 cup of epsom salts in a quart of warm tap water.  Submerge your foot or feet with outer bandage intact for the initial soak; this will allow the bandage to become moist and wet for easy lift off.  Once you remove your bandage, continue to soak in the solution for 20 minutes.  This soak should be done twice a day.  Next, remove your foot or feet from solution, blot dry the affected area and cover.  You may use a band aid large enough to cover the area or use gauze and tape.  Apply other medications to the area as directed by the doctor such as polysporin neosporin.  IF YOUR SKIN BECOMES IRRITATED WHILE USING THESE INSTRUCTIONS, IT IS OKAY TO SWITCH TO  WHITE VINEGAR AND WATER. Or you may use antibacterial soap and water to keep the toe clean  Monitor for any signs/symptoms of infection. Call the office immediately if any occur or go directly to the emergency room. Call with any questions/concerns.     Terbinafine oral granules What is this medicine? TERBINAFINE (TER bin a feen) is an antifungal medicine. It is used to treat certain kinds of fungal or yeast infections. This medicine may be used for other purposes; ask your health care provider or pharmacist if you have questions. What should I tell my health care provider before I take this medicine? They need to know if you have any of these conditions: -drink alcoholic beverages -kidney disease -liver disease -an unusual or allergic reaction to Terbinafine, other medicines, foods, dyes, or preservatives -pregnant or trying to get pregnant -breast-feeding How should I use this medicine? Take this medicine by mouth. Follow the directions on the prescription label. Hold packet with cut line on top. Shake packet gently to settle contents. Tear packet open along cut line, or use scissors to cut across line. Carefully pour the entire contents of packet onto a spoonful of a soft food,  such as pudding or other soft, non-acidic food such as mashed potatoes (do NOT use applesauce or a fruit-based food). If two packets are required for each dose, you may either sprinkle the content of both packets on one spoonful of non-acidic food, or sprinkle the contents of both packets on two spoonfuls of non-acidic food. Make sure that no granules remain in the packet. Swallow the mxiture of the food and granules without chewing. Take your medicine at regular intervals. Do not take it more often than directed. Take all of your medicine as directed even if you think you are better. Do not skip doses or stop your medicine early. Contact your pediatrician or health care professional regarding the use of this medicine in children. While this medicine may be prescribed for children as young as 4 years for selected conditions, precautions do apply. Overdosage: If you think you have taken too much of this medicine contact a poison control center or emergency room at once. NOTE: This medicine is only for you. Do not share this medicine with others. What if I miss a dose? If you miss a dose, take it as soon as you can. If it is almost time for your next dose, take only that dose. Do not take double or extra doses. What may interact with this medicine? Do not take this medicine with any of the following medications: -thioridazine This medicine may also interact with the following medications: -beta-blockers -caffeine -cimetidine -cyclosporine -MAOIs like Carbex,  Eldepryl, Marplan, Nardil, and Parnate -medicines for fungal infections like fluconazole and ketoconazole -medicines for irregular heartbeat like amiodarone, flecainide and propafenone -rifampin -SSRIs like citalopram, escitalopram, fluoxetine, fluvoxamine, paroxetine and sertraline -tricyclic antidepressants like amitriptyline, clomipramine, desipramine, imipramine, nortriptyline, and others -warfarin This list may not describe all possible  interactions. Give your health care provider a list of all the medicines, herbs, non-prescription drugs, or dietary supplements you use. Also tell them if you smoke, drink alcohol, or use illegal drugs. Some items may interact with your medicine. What should I watch for while using this medicine? Your doctor may monitor your liver function. Tell your doctor right away if you have nausea or vomiting, loss of appetite, stomach pain on your right upper side, yellow skin, dark urine, light stools, or are over tired. You need to take this medicine for 6 weeks or longer to cure the fungal infection. Take your medicine regularly for as long as your doctor or health care professional tells you to. What side effects may I notice from receiving this medicine? Side effects that you should report to your doctor or health care professional as soon as possible: -allergic reactions like skin rash or hives, swelling of the face, lips, or tongue -change in vision -dark urine -fever or infection -general ill feeling or flu-like symptoms -light-colored stools -loss of appetite, nausea -redness, blistering, peeling or loosening of the skin, including inside the mouth -right upper belly pain -unusually weak or tired -yellowing of the eyes or skin Side effects that usually do not require medical attention (report to your doctor or health care professional if they continue or are bothersome): -changes in taste -diarrhea -hair loss -muscle or joint pain -stomach upset This list may not describe all possible side effects. Call your doctor for medical advice about side effects. You may report side effects to FDA at 1-800-FDA-1088. Where should I keep my medicine? Keep out of the reach of children. Store at room temperature between 15 and 30 degrees C (59 and 86 degrees F). Throw away any unused medicine after the expiration date. NOTE: This sheet is a summary. It may not cover all possible information. If you have  questions about this medicine, talk to your doctor, pharmacist, or health care provider.    2016, Elsevier/Gold Standard. (2007-11-20 17:25:48)

## 2016-05-17 ENCOUNTER — Telehealth: Payer: Self-pay | Admitting: *Deleted

## 2016-05-17 LAB — CBC WITH DIFFERENTIAL
BASOS ABS: 0 10*3/uL (ref 0.0–0.2)
BASOS: 0 %
EOS (ABSOLUTE): 0.1 10*3/uL (ref 0.0–0.4)
Eos: 1 %
HEMOGLOBIN: 12.5 g/dL (ref 11.1–15.9)
Hematocrit: 38.1 % (ref 34.0–46.6)
Immature Grans (Abs): 0 10*3/uL (ref 0.0–0.1)
Immature Granulocytes: 0 %
LYMPHS ABS: 2.8 10*3/uL (ref 0.7–3.1)
Lymphs: 42 %
MCH: 29.3 pg (ref 26.6–33.0)
MCHC: 32.8 g/dL (ref 31.5–35.7)
MCV: 89 fL (ref 79–97)
MONOCYTES: 6 %
Monocytes Absolute: 0.4 10*3/uL (ref 0.1–0.9)
NEUTROS ABS: 3.4 10*3/uL (ref 1.4–7.0)
Neutrophils: 51 %
RBC: 4.27 x10E6/uL (ref 3.77–5.28)
RDW: 13.7 % (ref 12.3–15.4)
WBC: 6.7 10*3/uL (ref 3.4–10.8)

## 2016-05-17 LAB — HEPATIC FUNCTION PANEL
ALK PHOS: 79 IU/L (ref 39–117)
ALT: 29 IU/L (ref 0–32)
AST: 26 IU/L (ref 0–40)
Albumin: 4.7 g/dL (ref 3.5–5.5)
BILIRUBIN, DIRECT: 0.17 mg/dL (ref 0.00–0.40)
Bilirubin Total: 0.6 mg/dL (ref 0.0–1.2)
TOTAL PROTEIN: 6.9 g/dL (ref 6.0–8.5)

## 2016-05-17 NOTE — Telephone Encounter (Addendum)
-----   Message from Trula Slade, DPM sent at 05/17/2016 10:11 AM EDT ----- Blood work normal- ok to start lamsil. Please let her know. Left message with Dr. Leigh Aurora orders. 07/10/2016-DrJacqualyn Posey reviewed pt's 07/03/2016 blood work as normal and is to continue medication. Left message with Dr. Leigh Aurora orders.

## 2016-05-21 ENCOUNTER — Other Ambulatory Visit: Payer: Self-pay | Admitting: Physician Assistant

## 2016-05-21 DIAGNOSIS — M7661 Achilles tendinitis, right leg: Secondary | ICD-10-CM

## 2016-05-21 DIAGNOSIS — M7662 Achilles tendinitis, left leg: Principal | ICD-10-CM

## 2016-05-22 ENCOUNTER — Telehealth: Payer: Self-pay | Admitting: Podiatry

## 2016-05-22 ENCOUNTER — Other Ambulatory Visit: Payer: Self-pay | Admitting: *Deleted

## 2016-05-22 DIAGNOSIS — M7661 Achilles tendinitis, right leg: Secondary | ICD-10-CM

## 2016-05-22 DIAGNOSIS — M7662 Achilles tendinitis, left leg: Principal | ICD-10-CM

## 2016-05-22 MED ORDER — HYDROCODONE-ACETAMINOPHEN 10-325 MG PO TABS: 1.0000 | ORAL_TABLET | Freq: Four times a day (QID) | ORAL | 0 refills | Status: DC | PRN

## 2016-05-22 MED ORDER — TRAMADOL HCL 50 MG PO TABS: 50.0000 mg | ORAL_TABLET | Freq: Three times a day (TID) | ORAL | 0 refills | Status: DC | PRN

## 2016-05-22 NOTE — Addendum Note (Signed)
Addended by: Harriett Sine D on: 05/22/2016 05:29 PM   Modules accepted: Orders

## 2016-05-22 NOTE — Telephone Encounter (Addendum)
I left a message informing pt to begin the therapeutic soaks if she hasn't already, and she may want to use 1/2 C epsom salt to 1 Qt water and neosporin, and use the antibacterial soap soak later. I told pt to treat the area as a burn and if she could tolerate Ibuprofen OTC take as the package directs and that I have put in an order from Dr Jacqualyn Posey for pain medication. Left message informing pt Tramadol had been called in to [facility] [address].

## 2016-05-22 NOTE — Telephone Encounter (Signed)
Pt came in to see if Dr. Jacqualyn Posey would give her some pain meds for her pain she is having in with her toenail removal

## 2016-05-22 NOTE — Telephone Encounter (Signed)
Can do tramaol 50mg  q8h prn pain Disp #15

## 2016-05-30 ENCOUNTER — Ambulatory Visit (INDEPENDENT_AMBULATORY_CARE_PROVIDER_SITE_OTHER): Payer: BLUE CROSS/BLUE SHIELD | Admitting: Podiatry

## 2016-05-30 ENCOUNTER — Encounter: Payer: Self-pay | Admitting: Podiatry

## 2016-05-30 DIAGNOSIS — Z79899 Other long term (current) drug therapy: Secondary | ICD-10-CM

## 2016-05-30 DIAGNOSIS — Z9889 Other specified postprocedural states: Secondary | ICD-10-CM

## 2016-05-30 DIAGNOSIS — B351 Tinea unguium: Secondary | ICD-10-CM

## 2016-05-30 DIAGNOSIS — M79675 Pain in left toe(s): Secondary | ICD-10-CM

## 2016-05-30 MED ORDER — TERBINAFINE HCL 250 MG PO TABS: 250.0000 mg | ORAL_TABLET | Freq: Every day | ORAL | 2 refills | Status: DC

## 2016-05-30 NOTE — Patient Instructions (Signed)

## 2016-06-06 NOTE — Progress Notes (Signed)
Subjective: Krystal Simpson is a 55 y.o.  female returns to office today for follow up evaluation after having left Hallux total  nail avulsion performed. Patient has been soaking using epsom salts and applying topical antibiotic covered with bandaid daily. She states that she was having pain in terminal scalding that she did not pick this up. Her pain is improved. Denies any swelling or redness or any drainage. Patient denies fevers, chills, nausea, vomiting. Denies any calf pain, chest pain, SOB.   Objective:  Vitals: Reviewed  General: Well developed, nourished, in no acute distress, alert and oriented x3   Dermatology: Skin is warm, dry and supple bilateral. Left hallux nail bed appears to be clean, dry, with mild granular tissue and surrounding scab. There is no surrounding erythema, edema, drainage/purulence. The remaining nails appear unremarkable at this time. There are no other lesions or other signs of infection present.  Neurovascular status: Intact. No lower extremity swelling; No pain with calf compression bilateral.  Musculoskeletal: Decreased tenderness to palpation of the left hallux nail bed. Muscular strength within normal limits bilateral.   Assesement and Plan: S/p partial nail avulsion, doing well.   -Continue soaking in epsom salts twice a day followed by antibiotic ointment and a band-aid. Can leave uncovered at night. Continue this until completely healed.  -If the area has not healed in 2 weeks, call the office for follow-up appointment, or sooner if any problems arise.  -Refilled Lamisil lesion she had a three-month supply. I also reordered bloodwork for her to complete after she completes one month of the treatment. She monitor for any signs or symptoms of the side effects of Lamisil and to call the office if any occur. -Monitor for any signs/symptoms of infection. Call the office immediately if any occur or go directly to the emergency room. Call with any  questions/concerns.  Celesta Gentile, DPM

## 2016-06-14 ENCOUNTER — Other Ambulatory Visit: Payer: Self-pay | Admitting: Neurological Surgery

## 2016-06-14 DIAGNOSIS — M79604 Pain in right leg: Principal | ICD-10-CM

## 2016-06-14 DIAGNOSIS — G96198 Other disorders of meninges, not elsewhere classified: Secondary | ICD-10-CM

## 2016-06-14 DIAGNOSIS — G8929 Other chronic pain: Secondary | ICD-10-CM

## 2016-06-14 DIAGNOSIS — G9619 Other disorders of meninges, not elsewhere classified: Secondary | ICD-10-CM

## 2016-06-17 ENCOUNTER — Other Ambulatory Visit: Payer: Self-pay | Admitting: Physician Assistant

## 2016-06-17 DIAGNOSIS — M7661 Achilles tendinitis, right leg: Secondary | ICD-10-CM

## 2016-06-17 DIAGNOSIS — M7662 Achilles tendinitis, left leg: Principal | ICD-10-CM

## 2016-06-17 MED ORDER — HYDROCODONE-ACETAMINOPHEN 10-325 MG PO TABS: 1.0000 | ORAL_TABLET | Freq: Four times a day (QID) | ORAL | 0 refills | Status: DC | PRN

## 2016-06-17 NOTE — Telephone Encounter (Signed)
Last ov 12/29/2015. Last refilled 05/22/2016.

## 2016-06-17 NOTE — Telephone Encounter (Signed)
Rx printed and will be up front for pick up.

## 2016-06-17 NOTE — Telephone Encounter (Signed)
Pt needs a refill on her hydrocodone.  Please call when Rx is ready.  She still has a couple days left.  [phone]  Thank sTeri

## 2016-06-26 ENCOUNTER — Ambulatory Visit: Payer: BLUE CROSS/BLUE SHIELD | Admitting: Physician Assistant

## 2016-06-28 ENCOUNTER — Ambulatory Visit (INDEPENDENT_AMBULATORY_CARE_PROVIDER_SITE_OTHER): Payer: BLUE CROSS/BLUE SHIELD | Admitting: Physician Assistant

## 2016-06-28 ENCOUNTER — Ambulatory Visit
Admission: RE | Admit: 2016-06-28 | Discharge: 2016-06-28 | Disposition: A | Discharge status: Home / Self Care | Payer: BLUE CROSS/BLUE SHIELD | Source: Ambulatory Visit | Attending: Neurological Surgery | Admitting: Neurological Surgery

## 2016-06-28 ENCOUNTER — Encounter: Payer: Self-pay | Admitting: Physician Assistant

## 2016-06-28 VITALS — BP 110/70 | HR 80 | Temp 98.2°F | Resp 16 | Ht 64.5 in | Wt 137.0 lb

## 2016-06-28 DIAGNOSIS — F419 Anxiety disorder, unspecified: Secondary | ICD-10-CM | POA: Diagnosis not present

## 2016-06-28 DIAGNOSIS — G9619 Other disorders of meninges, not elsewhere classified: Secondary | ICD-10-CM

## 2016-06-28 DIAGNOSIS — G96198 Other disorders of meninges, not elsewhere classified: Secondary | ICD-10-CM

## 2016-06-28 DIAGNOSIS — M4804 Spinal stenosis, thoracic region: Secondary | ICD-10-CM | POA: Insufficient documentation

## 2016-06-28 DIAGNOSIS — F331 Major depressive disorder, recurrent, moderate: Secondary | ICD-10-CM | POA: Diagnosis not present

## 2016-06-28 DIAGNOSIS — M79604 Pain in right leg: Secondary | ICD-10-CM | POA: Insufficient documentation

## 2016-06-28 DIAGNOSIS — G8929 Other chronic pain: Secondary | ICD-10-CM | POA: Diagnosis present

## 2016-06-28 DIAGNOSIS — R635 Abnormal weight gain: Secondary | ICD-10-CM | POA: Diagnosis not present

## 2016-06-28 IMAGING — US US RENAL
1 series · 14 of 25 positions shown · non-contrast
Comparison: CT [DATE]/ 5422.

CLINICAL DATA: Nephrolithiasis.

EXAM:
RENAL / URINARY TRACT ULTRASOUND COMPLETE

[Series 1: us renal · 0.22mm/px · 14 of 41 slices shown]
[im 1/41]
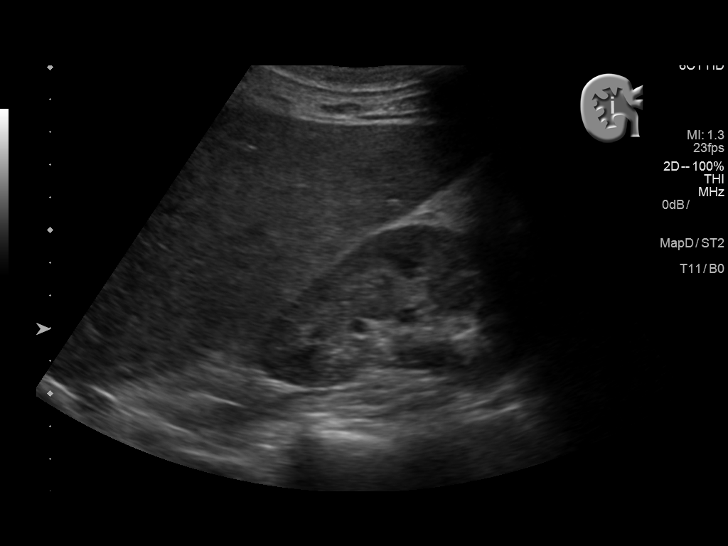
[im 4/41]
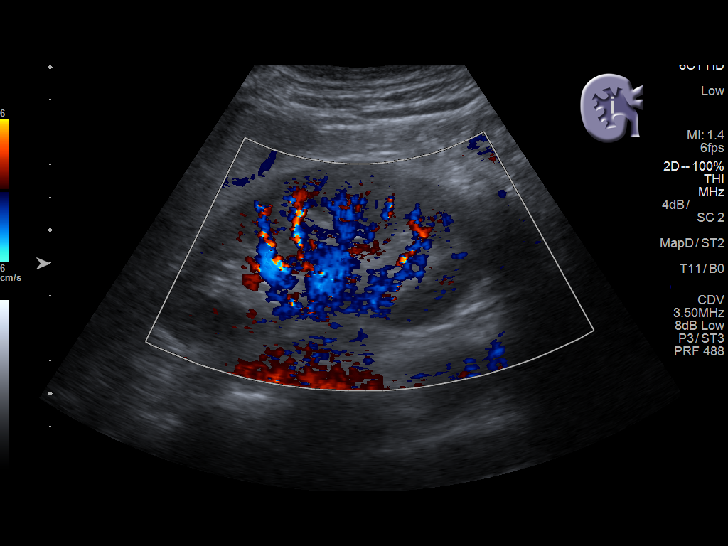
[im 7/41]
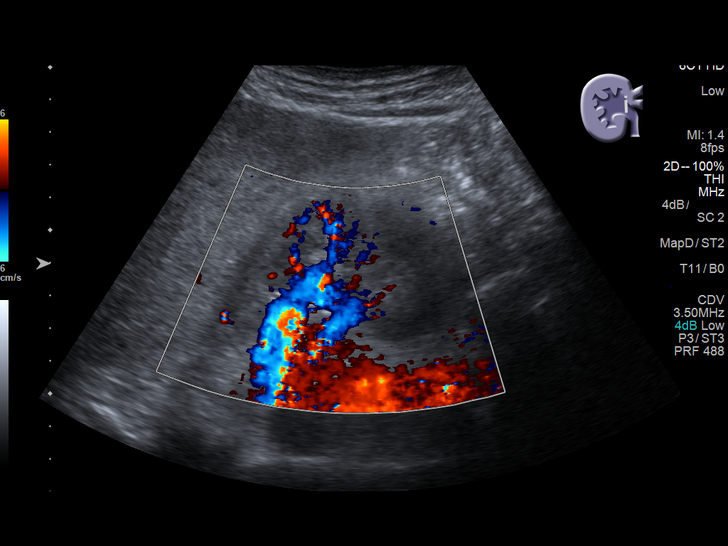
[im 11/41]
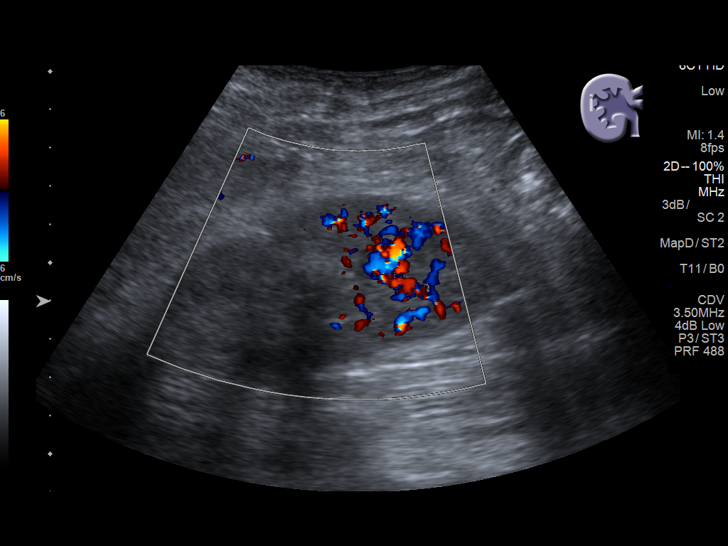
[im 14/41]
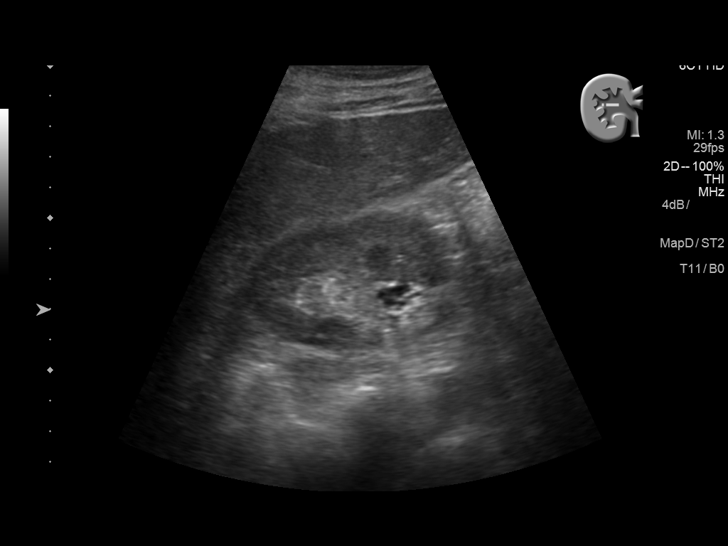
[im 16/41]
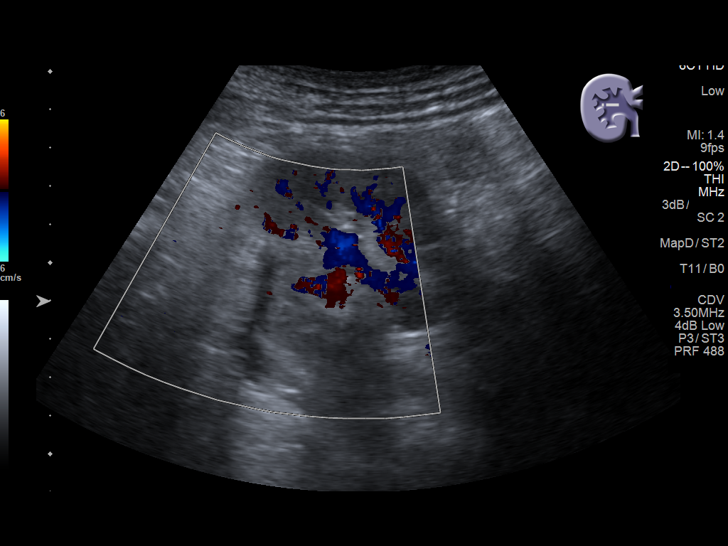
[im 19/41]
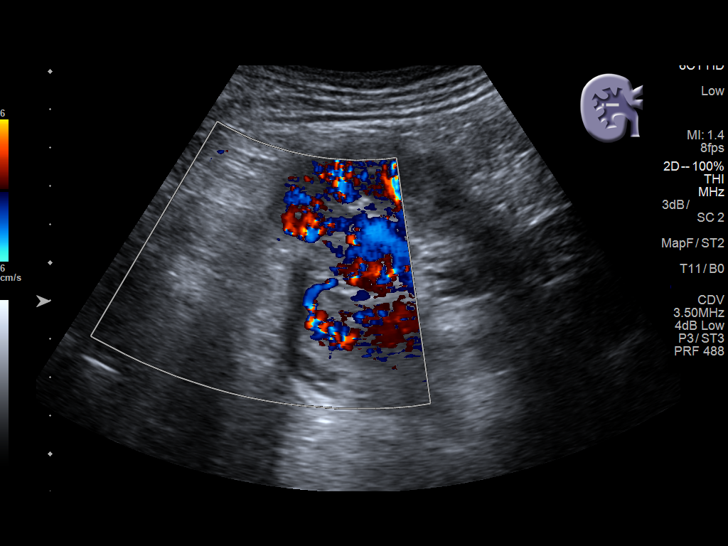
[im 22/41]
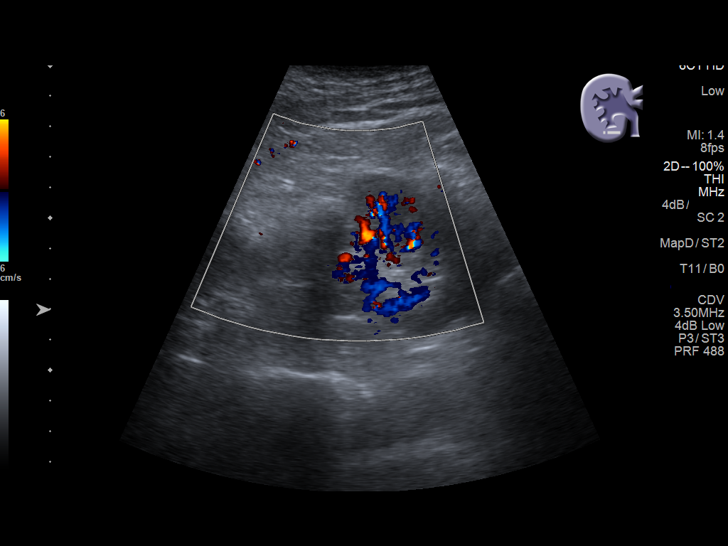
[im 26/41]
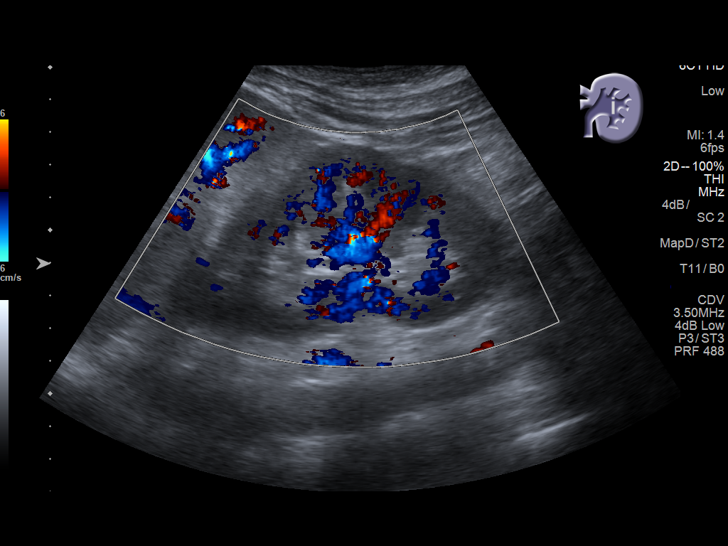
[im 27/41]
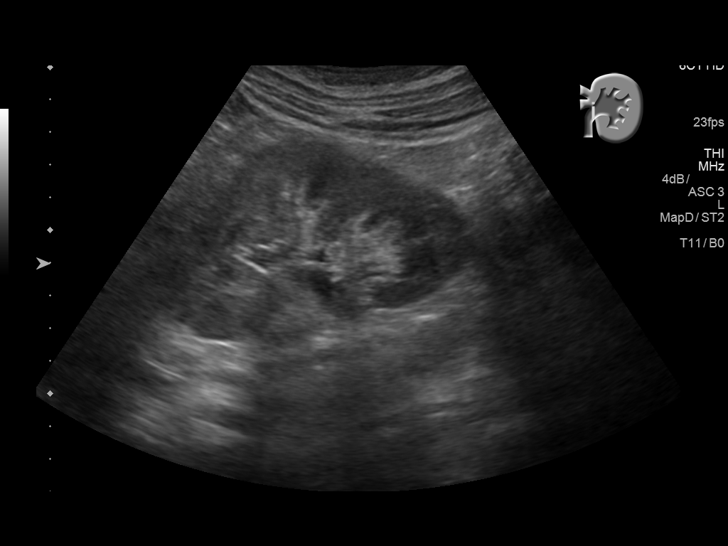
[im 31/41]
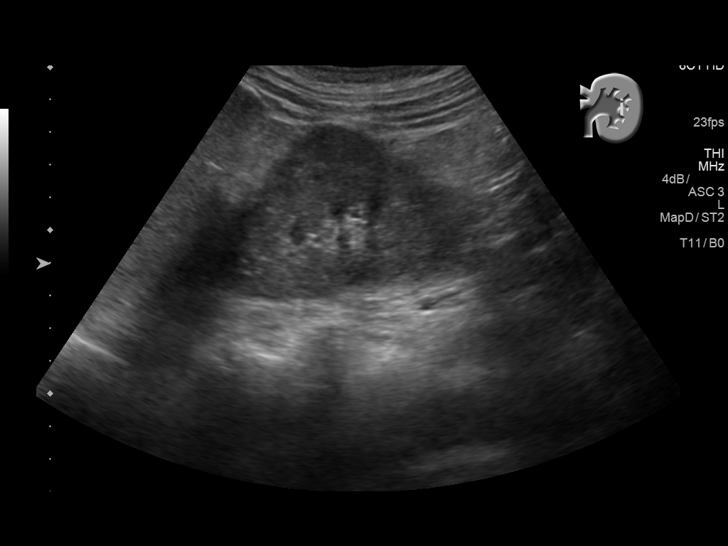
[im 34/41]
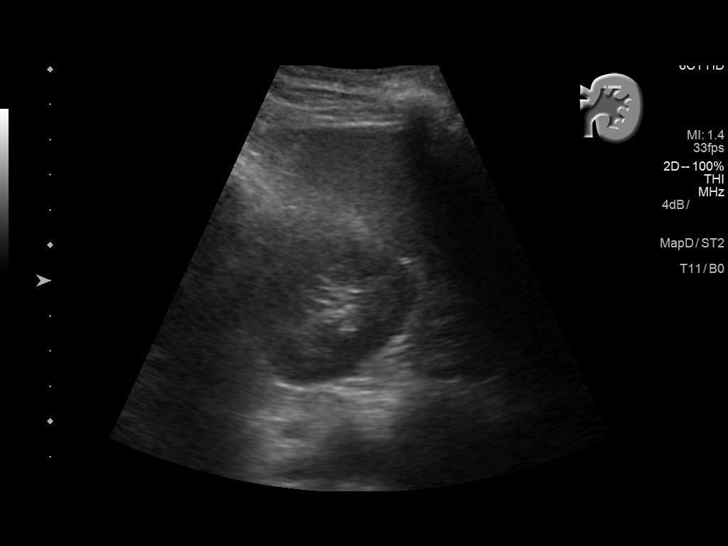
[im 37/41]
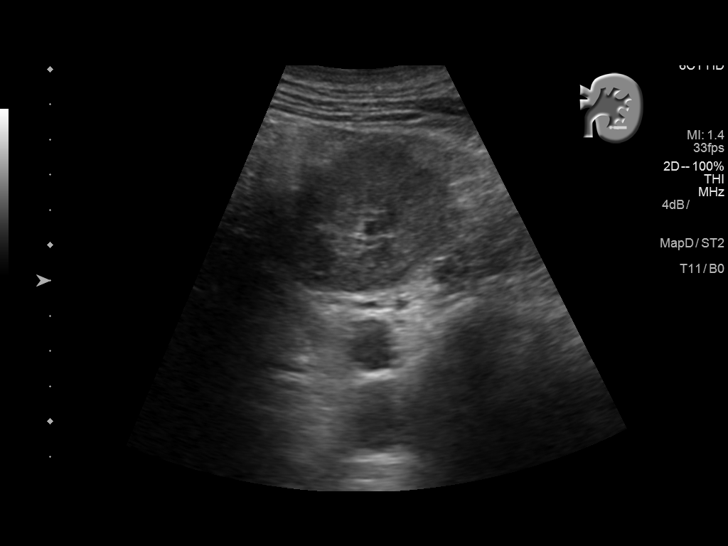
[im 41/41]
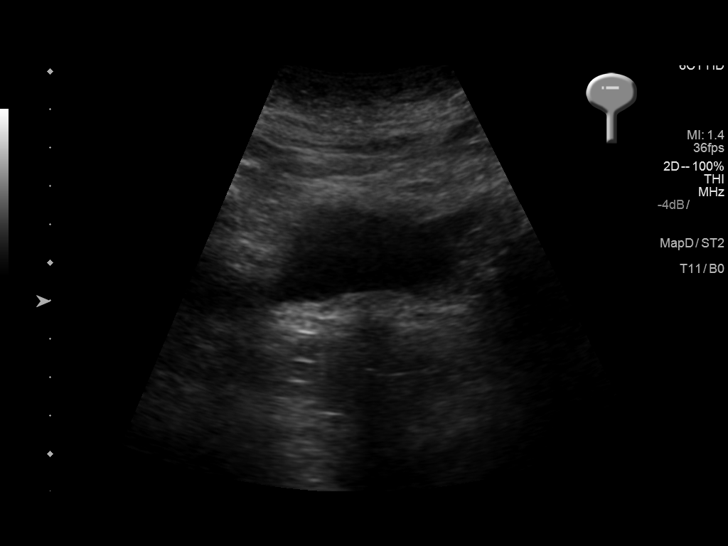

[14 of 25 positions shown; findings below may reference images not displayed]

FINDINGS: Right Kidney:

Length: 11.0 cm. Echogenicity within normal limits. No mass or
hydronephrosis visualized. Focal area scarring noted in the upper to
midportion of the right kidney, this is stable from prior CT .

Left Kidney:

Length: 10.6 cm. Echogenicity within normal limits. No mass or
hydronephrosis visualized. Previously identified left hydronephrosis
has resolved.

Bladder:

Appears normal for degree of bladder distention.
IMPRESSION: 1. No acute abnormality. Previously identified left hydronephrosis
has resolved.

2. Focal area of scarring noted the upper to midportion right
kidney, no change from prior CT of 09/29/2015.

## 2016-06-28 MED ORDER — ACETAMINOPHEN 325 MG PO TABS
650.0000 mg | ORAL_TABLET | ORAL | Status: DC | PRN
  Filled 2016-06-28: qty 2

## 2016-06-28 MED ORDER — IOPAMIDOL (ISOVUE-M 300) INJECTION 61%: 5.0000 mL | Freq: Once | INTRAMUSCULAR | Status: DC | PRN

## 2016-06-28 MED ORDER — IOPAMIDOL (ISOVUE-M 300) INJECTION 61%
10.0000 mL | Freq: Once | INTRAMUSCULAR | Status: AC | PRN
  Administered 2016-06-28: 10 mL via INTRATHECAL

## 2016-06-28 NOTE — Progress Notes (Signed)
Patient: Krystal Simpson Female    DOB: 02-13-1961   55 y.o.   MRN: [id number] Visit Date: 06/28/2016  Today's Provider: Mar Daring, PA-C   Chief Complaint  Patient presents with  . Obesity   Subjective:    HPI Patient c/o weight gain. Patient reports that she has gained around 20 lbs. Patient reports that she has been "binge" eating. Patient reports that gaining so much weight is also making her depressed. Patient is requesting to a prescription medication for weight loss. In April 2017 she did weight 119 pounds which was almost underweight and she was encouraged by many providers to try to gain a little. She has since increased to 137 pounds today which gives her a healthy BMI of 23. She has previously been obese and is scared she is going to gain all that weight back. She does see Dr. Nicolasa Ducking and a counselor. When she lost the weight previously she was able to lose without any medical assistance. She reports her stomach is getting "huge" and she is ashamed to go out into public looking the way she does. She continues to walk a minimum of 3 miles daily. She states she does not go to the gym like she use to because of being ashamed for anyone to see her this way.     Allergies  Allergen Reactions  . Ciprofloxacin     Other reaction(s): Joint Pains  . Tetanus Toxoids Hives     Current Outpatient Prescriptions:  .  Cholecalciferol 1000 UNITS capsule, Take 1,000 Units by mouth 2 (two) times daily. , Disp: , Rfl:  .  clonazePAM (KLONOPIN) 0.25 MG disintegrating tablet, , Disp: , Rfl: 1 .  CYANOCOBALAMIN PO, Take 2 tablets by mouth daily. , Disp: , Rfl:  .  diclofenac (VOLTAREN) 75 MG EC tablet, Take 1 tablet (75 mg total) by mouth 2 (two) times daily., Disp: 180 tablet, Rfl: 1 .  diclofenac sodium (VOLTAREN) 1 % GEL, Apply 2 g topically 4 (four) times daily., Disp: 100 g, Rfl: 5 .  DULoxetine (CYMBALTA) 60 MG capsule, Take 60 mg by mouth daily. , Disp: , Rfl:  .  Flaxseed,  Linseed, (FLAX SEEDS PO), Take 1 capsule by mouth daily., Disp: , Rfl:  .  gabapentin (NEURONTIN) 300 MG capsule, 1 po qHS x 7 days, then 2 po qHS, Disp: , Rfl:  .  HYDROcodone-acetaminophen (NORCO) 10-325 MG tablet, Take 1 tablet by mouth every 6 (six) hours as needed., Disp: 100 tablet, Rfl: 0 .  MULTIPLE VITAMINS-MINERALS PO, Take 1 tablet by mouth daily. , Disp: , Rfl:  .  NONFORMULARY OR COMPOUNDED ITEM, [facility] compound:  Onychomycosis nail lacquer - Fluconazole 2%, Terbinafine 1%, DMSO apply to affected area daily., Disp: 120 each, Rfl: 2 .  Omega-3 Fatty Acids (FISH OIL) 1200 MG CAPS, Take 2 capsules by mouth 2 (two) times daily. , Disp: , Rfl:  .  promethazine (PHENERGAN) 25 MG tablet, TAKE 1 TABLET (25 MG TOTAL) BY MOUTH EVERY 6 (SIX) HOURS AS NEEDED FOR NAUSEA., Disp: , Rfl: 0 .  sertraline (ZOLOFT) 100 MG tablet, Take 100 mg by mouth 2 (two) times daily. , Disp: , Rfl:  .  SUMAtriptan (IMITREX) 100 MG tablet, TAKE 1 AT HEADACHE ONSET, MAY REPEAT AFTER 2 HOURS, NO MORE THAN 2 IN A DAY,NO MORE THAN 4 IN A WEEK, Disp: , Rfl:  .  terbinafine (LAMISIL) 250 MG tablet, Take 1 tablet (250 mg total)  by mouth daily., Disp: 30 tablet, Rfl: 2 .  Tetrahydrozoline HCl (EYE DROPS OP), Apply 1 drop to eye 2 (two) times daily. , Disp: , Rfl:  .  topiramate (TOPAMAX) 25 MG tablet, Take 25 mg by mouth daily. 3 tablets at bedtime, Disp: , Rfl:  .  traZODone (DESYREL) 50 MG tablet, 50 mg at bedtime as needed. Reported on 10/04/2015, Disp: , Rfl:  .  triamcinolone (NASACORT) 55 MCG/ACT AERO nasal inhaler, Place 2 sprays into the nose daily., Disp: 1 Inhaler, Rfl: 12 No current facility-administered medications for this visit.   Facility-Administered Medications Ordered in Other Visits:  .  acetaminophen (TYLENOL) tablet 650 mg, 650 mg, Oral, Q4H PRN, [employer] MD  Review of Systems  Constitutional: Positive for unexpected weight change. Negative for activity change, appetite change,  chills, fatigue and fever.  Respiratory: Negative.   Cardiovascular: Negative.   Gastrointestinal: Negative.   Neurological: Negative.   Psychiatric/Behavioral: Positive for dysphoric mood. The patient is nervous/anxious.     Social History  Substance Use Topics  . Smoking status: Never Smoker  . Smokeless tobacco: Never Used  . Alcohol use No   Objective:   BP 110/70 (BP Location: Left Arm, Patient Position: Sitting, Cuff Size: Normal)   Pulse 80   Temp 98.2 F (36.8 C) (Oral)   Resp 16   Ht 5' 4.5" (1.638 m)   Wt 137 lb (62.1 kg)   SpO2 97%   BMI 23.15 kg/m   Physical Exam  Constitutional: She appears well-developed and well-nourished. No distress.  Neck: Normal range of motion. Neck supple. No tracheal deviation present. No thyromegaly present.  Cardiovascular: Normal rate, regular rhythm and normal heart sounds.  Exam reveals no gallop and no friction rub.   No murmur heard. Pulmonary/Chest: Effort normal and breath sounds normal. No respiratory distress. She has no wheezes. She has no rales.  Musculoskeletal: She exhibits no edema.  Lymphadenopathy:    She has no cervical adenopathy.  Skin: She is not diaphoretic.  Psychiatric: Her speech is normal and behavior is normal. Judgment and thought content normal. Her mood appears anxious. Cognition and memory are normal. She exhibits a depressed mood.  Very fidgety and became tearful at one point when discussing her appearance  Vitals reviewed.     Assessment & Plan:     1. Weight gain She has gained approx 18 pounds over a 6 month period, which is quite a bit, but she is still in a normal BMI. I discussed that she is not a candidate for appetite suppressants or weight loss supplements. I encouraged her to start a food diary so she could log her food and may help discourage her from "binging."   I also discussed her removing the foods she normally binges on out of the house. She states she is just in a vicious cycle of  binging then getting depressed and then binging more from the depression. She is currently on many medications for her depression and anxiety.   I was able to get in touch with Dr. Nicolasa Ducking about the patient and she agrees that any appetite suppressant is not appropriate and that any stimulant based medication will increase and worsen or anxiety. We discussed she may need to address these behaviors with her counselor so she can help with better coping mechanisms and "get me back on track" as the patient is wanting to do. We also discussed how patient is on max dose of multiple medications and how the  patient tries to rely on medications to "fix" her problems. She has been educated many times and counseling has also tried to help her not rely on medications as much without much success.   2. Moderate episode of recurrent major depressive disorder (HCC) Seemingly uncontrolled. Patient was very anxious and tearful. She is already taking max dose medications at this time. Had recently had appt with Dr. Nicolasa Ducking on 06/03/16. Dr. Nicolasa Ducking does acknowledge patient is nervous/anxious at baseline.  3. Anxiety See above medical treatment plan.  I spent approximately 30 minutes with the patient today. Over 50% of this time was spent with counseling and educating the patient.       Mar Daring, PA-C  [facility]

## 2016-06-28 NOTE — Patient Instructions (Signed)

## 2016-07-04 LAB — HEPATIC FUNCTION PANEL
ALK PHOS: 66 IU/L (ref 39–117)
ALT: 12 IU/L (ref 0–32)
AST: 19 IU/L (ref 0–40)
Albumin: 4.8 g/dL (ref 3.5–5.5)
BILIRUBIN, DIRECT: 0.15 mg/dL (ref 0.00–0.40)
Bilirubin Total: 0.5 mg/dL (ref 0.0–1.2)
TOTAL PROTEIN: 6.7 g/dL (ref 6.0–8.5)

## 2016-07-04 LAB — CBC WITH DIFFERENTIAL/PLATELET
BASOS ABS: 0 10*3/uL (ref 0.0–0.2)
BASOS: 0 %
EOS (ABSOLUTE): 0.1 10*3/uL (ref 0.0–0.4)
EOS: 2 %
HEMATOCRIT: 36.2 % (ref 34.0–46.6)
HEMOGLOBIN: 11.4 g/dL (ref 11.1–15.9)
IMMATURE GRANS (ABS): 0 10*3/uL (ref 0.0–0.1)
Immature Granulocytes: 0 %
LYMPHS: 53 %
Lymphocytes Absolute: 2.6 10*3/uL (ref 0.7–3.1)
MCH: 27.9 pg (ref 26.6–33.0)
MCHC: 31.5 g/dL (ref 31.5–35.7)
MCV: 89 fL (ref 79–97)
MONOCYTES: 8 %
Monocytes Absolute: 0.4 10*3/uL (ref 0.1–0.9)
NEUTROS ABS: 1.9 10*3/uL (ref 1.4–7.0)
Neutrophils: 37 %
Platelets: 224 10*3/uL (ref 150–379)
RBC: 4.09 x10E6/uL (ref 3.77–5.28)
RDW: 13.2 % (ref 12.3–15.4)
WBC: 5 10*3/uL (ref 3.4–10.8)

## 2016-07-16 ENCOUNTER — Other Ambulatory Visit: Payer: Self-pay | Admitting: Physician Assistant

## 2016-07-16 DIAGNOSIS — M7662 Achilles tendinitis, left leg: Principal | ICD-10-CM

## 2016-07-16 DIAGNOSIS — M7661 Achilles tendinitis, right leg: Secondary | ICD-10-CM

## 2016-07-16 NOTE — Telephone Encounter (Signed)
Pt needs refill on her hydrocodone 10/325  Please call when ready to pick up. [phone]  Thank sTeri

## 2016-07-17 MED ORDER — HYDROCODONE-ACETAMINOPHEN 10-325 MG PO TABS: 1.0000 | ORAL_TABLET | Freq: Four times a day (QID) | ORAL | 0 refills | Status: DC | PRN

## 2016-07-17 NOTE — Telephone Encounter (Signed)
Left patient a voicemail advising her RX is ready for pick up.

## 2016-08-12 ENCOUNTER — Other Ambulatory Visit: Payer: Self-pay | Admitting: Physician Assistant

## 2016-08-12 DIAGNOSIS — M7661 Achilles tendinitis, right leg: Secondary | ICD-10-CM

## 2016-08-12 DIAGNOSIS — M7662 Achilles tendinitis, left leg: Principal | ICD-10-CM

## 2016-08-12 MED ORDER — HYDROCODONE-ACETAMINOPHEN 10-325 MG PO TABS: 1.0000 | ORAL_TABLET | Freq: Four times a day (QID) | ORAL | 0 refills | Status: DC | PRN

## 2016-08-12 NOTE — Telephone Encounter (Signed)
Pt contacted office for refill request on the following medications: HYDROcodone-acetaminophen ([place]) 10-325 MG tablet Last OV: 07/17/16 Please advise. Thanks TNP

## 2016-08-12 NOTE — Telephone Encounter (Signed)
Refill printed 

## 2016-08-12 NOTE — Telephone Encounter (Signed)
Please review. KW 

## 2016-08-12 NOTE — Telephone Encounter (Signed)
Patient has been advised. KW 

## 2016-08-27 DIAGNOSIS — F330 Major depressive disorder, recurrent, mild: Secondary | ICD-10-CM | POA: Diagnosis not present

## 2016-08-27 DIAGNOSIS — F5081 Binge eating disorder: Secondary | ICD-10-CM | POA: Diagnosis not present

## 2016-08-27 DIAGNOSIS — F411 Generalized anxiety disorder: Secondary | ICD-10-CM | POA: Diagnosis not present

## 2016-08-27 DIAGNOSIS — F4312 Post-traumatic stress disorder, chronic: Secondary | ICD-10-CM | POA: Diagnosis not present

## 2016-08-28 DIAGNOSIS — M5414 Radiculopathy, thoracic region: Secondary | ICD-10-CM | POA: Diagnosis not present

## 2016-08-28 DIAGNOSIS — M25551 Pain in right hip: Secondary | ICD-10-CM | POA: Diagnosis not present

## 2016-08-28 DIAGNOSIS — M5416 Radiculopathy, lumbar region: Secondary | ICD-10-CM | POA: Diagnosis not present

## 2016-08-28 DIAGNOSIS — M5126 Other intervertebral disc displacement, lumbar region: Secondary | ICD-10-CM | POA: Diagnosis not present

## 2016-09-10 DIAGNOSIS — G479 Sleep disorder, unspecified: Secondary | ICD-10-CM | POA: Diagnosis not present

## 2016-09-10 DIAGNOSIS — M5489 Other dorsalgia: Secondary | ICD-10-CM | POA: Diagnosis not present

## 2016-09-10 DIAGNOSIS — G43909 Migraine, unspecified, not intractable, without status migrainosus: Secondary | ICD-10-CM | POA: Diagnosis not present

## 2016-09-19 ENCOUNTER — Other Ambulatory Visit: Payer: Self-pay | Admitting: Physician Assistant

## 2016-09-19 DIAGNOSIS — M7662 Achilles tendinitis, left leg: Principal | ICD-10-CM

## 2016-09-19 DIAGNOSIS — M7661 Achilles tendinitis, right leg: Secondary | ICD-10-CM

## 2016-09-19 MED ORDER — HYDROCODONE-ACETAMINOPHEN 10-325 MG PO TABS: 1.0000 | ORAL_TABLET | Freq: Four times a day (QID) | ORAL | 0 refills | Status: DC | PRN

## 2016-09-19 NOTE — Telephone Encounter (Signed)
Pt advised. Emily Drozdowski, CMA  

## 2016-09-19 NOTE — Telephone Encounter (Signed)
Rx printed and will be available up front for pick up

## 2016-10-04 ENCOUNTER — Encounter: Payer: Self-pay | Admitting: Family Medicine

## 2016-10-04 ENCOUNTER — Encounter: Payer: Self-pay | Admitting: Physician Assistant

## 2016-10-04 ENCOUNTER — Ambulatory Visit (INDEPENDENT_AMBULATORY_CARE_PROVIDER_SITE_OTHER): Payer: Medicare Other | Admitting: Physician Assistant

## 2016-10-04 VITALS — BP 106/70 | HR 80 | Temp 98.2°F | Resp 16 | Ht 64.0 in | Wt 138.0 lb

## 2016-10-04 DIAGNOSIS — Z803 Family history of malignant neoplasm of breast: Secondary | ICD-10-CM

## 2016-10-04 DIAGNOSIS — Z136 Encounter for screening for cardiovascular disorders: Secondary | ICD-10-CM | POA: Diagnosis not present

## 2016-10-04 DIAGNOSIS — Z13220 Encounter for screening for lipoid disorders: Secondary | ICD-10-CM

## 2016-10-04 DIAGNOSIS — Z124 Encounter for screening for malignant neoplasm of cervix: Secondary | ICD-10-CM | POA: Diagnosis not present

## 2016-10-04 DIAGNOSIS — R079 Chest pain, unspecified: Secondary | ICD-10-CM

## 2016-10-04 DIAGNOSIS — Z1239 Encounter for other screening for malignant neoplasm of breast: Secondary | ICD-10-CM

## 2016-10-04 DIAGNOSIS — Z0000 Encounter for general adult medical examination without abnormal findings: Secondary | ICD-10-CM | POA: Diagnosis not present

## 2016-10-04 DIAGNOSIS — E7800 Pure hypercholesterolemia, unspecified: Secondary | ICD-10-CM | POA: Diagnosis not present

## 2016-10-04 DIAGNOSIS — Z131 Encounter for screening for diabetes mellitus: Secondary | ICD-10-CM

## 2016-10-04 DIAGNOSIS — Z1231 Encounter for screening mammogram for malignant neoplasm of breast: Secondary | ICD-10-CM | POA: Diagnosis not present

## 2016-10-04 DIAGNOSIS — D649 Anemia, unspecified: Secondary | ICD-10-CM

## 2016-10-04 DIAGNOSIS — Z23 Encounter for immunization: Secondary | ICD-10-CM

## 2016-10-04 DIAGNOSIS — H6191 Disorder of right external ear, unspecified: Secondary | ICD-10-CM | POA: Diagnosis not present

## 2016-10-04 DIAGNOSIS — Z1211 Encounter for screening for malignant neoplasm of colon: Secondary | ICD-10-CM

## 2016-10-04 DIAGNOSIS — K7689 Other specified diseases of liver: Secondary | ICD-10-CM

## 2016-10-04 DIAGNOSIS — Z79899 Other long term (current) drug therapy: Secondary | ICD-10-CM | POA: Diagnosis not present

## 2016-10-04 DIAGNOSIS — F419 Anxiety disorder, unspecified: Secondary | ICD-10-CM | POA: Diagnosis not present

## 2016-10-04 NOTE — Patient Instructions (Signed)

## 2016-10-04 NOTE — Progress Notes (Deleted)
Patient: Krystal Simpson, Female    DOB: [birth year], 56 y.o.   MRN: [id number] Visit Date: 10/04/2016  Today's Provider: Mar Daring, PA-C   No chief complaint on file.  Subjective:    Annual physical exam Krystal Simpson is a 56 y.o. female who presents today for health maintenance and complete physical. She feels {DESC; WELL/FAIRLY WELL/POORLY:18703}. She reports exercising ***. She reports she is sleeping {DESC; WELL/FAIRLY WELL/POORLY:18703}.  Last CPE- 10/04/2015 Last pap- 07/22/2013- Pap WNL. HPV negative Last mammogram- 10/20/2015- BI-RADS 1 Last colonoscopy- 08/16/2013. Normal Hep C antibody negative on 07/22/2013 -----------------------------------------------------------------   Review of Systems  Social History      She  reports that she has never smoked. She has never used smokeless tobacco. She reports that she does not drink alcohol or use drugs.       Social History   Social History  . Marital status: Married    Spouse name: N/A  . Number of children: N/A  . Years of education: N/A   Social History Main Topics  . Smoking status: Never Smoker  . Smokeless tobacco: Never Used  . Alcohol use No  . Drug use: No  . Sexual activity: Not on file   Other Topics Concern  . Not on file   Social History Narrative  . No narrative on file    Past Medical History:  Diagnosis Date  . Abnormal Pap smear of cervix   . Anxiety   . Cancer [facility])    multiple different areas, squamous cell and basal cell skin ca  . Depression      Patient Active Problem List   Diagnosis Date Noted  . BPPV (benign paroxysmal positional vertigo) 11/14/2015  . Abnormal weight loss 10/04/2015  . Back ache 08/12/2015  . Right leg pain 08/07/2015  . Sciatica of right side 07/19/2015  . Low back pain 06/27/2015  . Abnormal thyroid stimulating hormone (TSH) level 06/01/2015  . Absolute anemia 06/01/2015  . Anxiety 06/01/2015  . Basal cell carcinoma 06/01/2015  .  Colon polyp 06/01/2015  . Clinical depression 06/01/2015  . Esophageal reflux 06/01/2015  . Headache, migraine 06/01/2015  . Adiposity 06/01/2015  . Hypercholesterolemia without hypertriglyceridemia 06/01/2015  . Squamous cell carcinoma 06/01/2015  . Avitaminosis D 06/01/2015  . Achilles tendonitis, bilateral 10/12/2014  . Disordered sleep 03/03/2014    Past Surgical History:  Procedure Laterality Date  . BASAL CELL CARCINOMA EXCISION  2014  . BASAL CELL CARCINOMA EXCISION  09/20/2015   Dr. Evorn Gong, left mid forearm  . MOHS SURGERY  2015   top of head Dr. Shelda Jakes at [facility]  . SQUAMOUS CELL CARCINOMA EXCISION  2014    Family History        Family Status  Relation Status  . Mother Deceased at age 25  . Father Deceased at age 22  . Sister Alive  . Maternal Grandmother Deceased at age 71   MI  . Sister Alive        Her family history includes Breast cancer in her sister; Emphysema in her mother; Healthy in her sister; Heart attack in her maternal grandmother; Lung cancer in her father.     Allergies  Allergen Reactions  . Ciprofloxacin     Other reaction(s): Joint Pains  . Tetanus Toxoids Hives     Current Outpatient Prescriptions:  .  Cholecalciferol 1000 UNITS capsule, Take 1,000 Units by mouth 2 (two) times daily. , Disp: , Rfl:  .  clonazePAM (KLONOPIN) 0.25 MG disintegrating tablet, , Disp: , Rfl: 1 .  CYANOCOBALAMIN PO, Take 2 tablets by mouth daily. , Disp: , Rfl:  .  diclofenac (VOLTAREN) 75 MG EC tablet, Take 1 tablet (75 mg total) by mouth 2 (two) times daily., Disp: 180 tablet, Rfl: 1 .  diclofenac sodium (VOLTAREN) 1 % GEL, Apply 2 g topically 4 (four) times daily., Disp: 100 g, Rfl: 5 .  DULoxetine (CYMBALTA) 60 MG capsule, Take 60 mg by mouth daily. , Disp: , Rfl:  .  Flaxseed, Linseed, (FLAX SEEDS PO), Take 1 capsule by mouth daily., Disp: , Rfl:  .  gabapentin (NEURONTIN) 300 MG capsule, 1 po qHS x 7 days, then 2 po qHS, Disp: , Rfl:  .   HYDROcodone-acetaminophen (NORCO) 10-325 MG tablet, Take 1 tablet by mouth every 6 (six) hours as needed., Disp: 100 tablet, Rfl: 0 .  lamoTRIgine (LAMICTAL) 25 MG tablet, Take 1 tablet (25 mg total) by mouth 2 (two) times daily., Disp: 60 tablet, Rfl: 0 .  MULTIPLE VITAMINS-MINERALS PO, Take 1 tablet by mouth daily. , Disp: , Rfl:  .  NONFORMULARY OR COMPOUNDED ITEM, [facility] compound:  Onychomycosis nail lacquer - Fluconazole 2%, Terbinafine 1%, DMSO apply to affected area daily., Disp: 120 each, Rfl: 2 .  Omega-3 Fatty Acids (FISH OIL) 1200 MG CAPS, Take 2 capsules by mouth 2 (two) times daily. , Disp: , Rfl:  .  promethazine (PHENERGAN) 25 MG tablet, TAKE 1 TABLET (25 MG TOTAL) BY MOUTH EVERY 6 (SIX) HOURS AS NEEDED FOR NAUSEA., Disp: , Rfl: 0 .  sertraline (ZOLOFT) 100 MG tablet, Take 100 mg by mouth 2 (two) times daily. , Disp: , Rfl:  .  SUMAtriptan (IMITREX) 100 MG tablet, TAKE 1 AT HEADACHE ONSET, MAY REPEAT AFTER 2 HOURS, NO MORE THAN 2 IN A DAY,NO MORE THAN 4 IN A WEEK, Disp: , Rfl:  .  terbinafine (LAMISIL) 250 MG tablet, Take 1 tablet (250 mg total) by mouth daily., Disp: 30 tablet, Rfl: 2 .  Tetrahydrozoline HCl (EYE DROPS OP), Apply 1 drop to eye 2 (two) times daily. , Disp: , Rfl:  .  topiramate (TOPAMAX) 25 MG tablet, Take 25 mg by mouth daily. 3 tablets at bedtime, Disp: , Rfl:  .  traZODone (DESYREL) 50 MG tablet, 50 mg at bedtime as needed. Reported on 10/04/2015, Disp: , Rfl:  .  triamcinolone (NASACORT) 55 MCG/ACT AERO nasal inhaler, Place 2 sprays into the nose daily., Disp: 1 Inhaler, Rfl: 12   Patient Care Team: Mar Daring, PA-C as PCP - General (Family Medicine)      Objective:   Vitals: There were no vitals taken for this visit.   Physical Exam   Depression Screen PHQ 2/9 Scores 11/08/2015 10/04/2015 02/15/2015 11/23/2014  PHQ - 2 Score 0 2 0 0  PHQ- 9 Score - 11 - -  Exception Documentation Other- indicate reason in comment box - Other- indicate  reason in comment box -      Assessment & Plan:     Routine Health Maintenance and Physical Exam  Exercise Activities and Dietary recommendations Goals    . Exercise 150 minutes per week (moderate activity)       Immunization History  Administered Date(s) Administered  . Influenza, Seasonal, Injecte, Preservative Fre 06/24/2015    Health Maintenance  Topic Date Due  . Hepatitis C Screening  09-Feb-1961  . HIV Screening  06/30/1975  . TETANUS/TDAP  06/30/1979  . PAP SMEAR  06/29/1981  . INFLUENZA VACCINE  04/23/2016  . MAMMOGRAM  10/19/2017  . COLONOSCOPY  08/17/2023     Discussed health benefits of physical activity, and encouraged her to engage in regular exercise appropriate for her age and condition.    --------------------------------------------------------------------    Mar Daring, PA-C  [place]

## 2016-10-04 NOTE — Progress Notes (Signed)
Patient: Krystal Simpson, Female    DOB: Nov 26, 1960, 56 y.o.   MRN: [id number] Visit Date: 10/04/2016  Today's Provider: Mar Daring, PA-C   Chief Complaint  Patient presents with  . Medicare Wellness   Subjective:    Annual Physical Exam Krystal Simpson is a 56 y.o. female. She feels well. She reports she is exercising. She reports she is sleeping poorly due to hot flashes. ----------------------------------------------------------- Last CPE- 10/04/2015 Last pap- 07/22/2013- Pap WNL. HPV negative Last mammogram- 10/20/2015- BI-RADS 1 Last colonoscopy- 08/16/2013. Normal Hep C antibody negative on 07/22/2013  She does have chronic issues and is followed by Dr. Gardiner Rhyme for herniated disc and back pain, Dr. Lawson Radar for neuropathy, Dr. Lelan Pons for migraines, Dr. Sula Rumple for onychomycosis, Dr. 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 Francesco Sor basal cell carcinoma of the right external canal. She is also followed by Dr. Domingo Pulse.   Chest pain-New: She complains of new onset chest pain that has been occurring over the last few weeks to month. She does exercise regularly and the symptoms never occur during physical activity. She denies any associated SOB or DOE.   Review of Systems  Constitutional: Positive for appetite change ("binge eating"), diaphoresis (night sweats), fatigue and unexpected weight change.  HENT: Positive for congestion, dental problem, ear pain, hearing loss and sinus pressure.   Eyes: Positive for photophobia, pain and visual disturbance.  Respiratory: Positive for chest tightness.   Cardiovascular: Positive for chest pain.  Gastrointestinal: Positive for abdominal distention and abdominal pain.  Endocrine: Positive for heat intolerance (at night) and polydipsia (dry mouth).  Musculoskeletal: Positive for back pain.  Skin: Positive for color change and wound.    Neurological: Positive for tremors, numbness and headaches.  Psychiatric/Behavioral: Positive for agitation, decreased concentration and sleep disturbance. The patient is nervous/anxious.   All other systems reviewed and are negative. Some of these issues are chronic but new is the chest pain symptoms.   Social History   Social History  . Marital status: Married    Spouse name: Abbe Amsterdam  . Number of children: 2  . Years of education: some college   Occupational History  . disabled    Social History Main Topics  . Smoking status: Never Smoker  . Smokeless tobacco: Never Used  . Alcohol use No  . Drug use: No  . Sexual activity: Not Currently   Other Topics Concern  . Not on file   Social History Narrative  . No narrative on file    Past Medical History:  Diagnosis Date  . Abnormal Pap smear of cervix   . Anxiety   . Cancer [facility])    multiple different areas, squamous cell and basal cell skin ca  . Depression      Patient Active Problem List   Diagnosis Date Noted  . BPPV (benign paroxysmal positional vertigo) 11/14/2015  . Abnormal weight loss 10/04/2015  . Back ache 08/12/2015  . Right leg pain 08/07/2015  . Sciatica of right side 07/19/2015  . Low back pain 06/27/2015  . Abnormal thyroid stimulating hormone (TSH) level 06/01/2015  . Absolute anemia 06/01/2015  . Anxiety 06/01/2015  . Basal cell carcinoma 06/01/2015  . Colon polyp 06/01/2015  . Clinical depression 06/01/2015  . Esophageal reflux 06/01/2015  . Headache, migraine 06/01/2015  . Adiposity 06/01/2015  . Hypercholesterolemia without hypertriglyceridemia 06/01/2015  . Squamous cell carcinoma 06/01/2015  . Avitaminosis D 06/01/2015  . Achilles tendonitis,  bilateral 10/12/2014  . Disordered sleep 03/03/2014    Past Surgical History:  Procedure Laterality Date  . BASAL CELL CARCINOMA EXCISION  2014  . BASAL CELL CARCINOMA EXCISION  09/20/2015   Dr. Evorn Gong, left mid forearm  . MOHS SURGERY  2015    top of head Dr[facility]lda [facility]akes at [facility]  . SQUAMOUS CELL CARCINOMA EXCISION  2014    Her family history includes Breast cancer in her sister; Emphysema in her mother; Healthy in her sister; Heart attack in her maternal grandmother; Lung cancer in her father.      Current Outpatient Prescriptions:  .  Cholecalciferol 1000 UNITS capsule, Take 1,000 Units by mouth 2 (two) times daily. , Disp: , Rfl:  .  clonazePAM (KLONOPIN) 0.25 MG disintegrating tablet, , Disp: , Rfl: 1 .  CYANOCOBALAMIN PO, Take 2 tablets by mouth daily. , Disp: , Rfl:  .  diclofenac sodium (VOLTAREN) 1 % GEL, Apply 2 g topically 4 (four) times daily., Disp: 100 g, Rfl: 5 .  DULoxetine (CYMBALTA) 60 MG capsule, Take 60 mg by mouth daily. , Disp: , Rfl:  .  Flaxseed, Linseed, (FLAX SEEDS PO), Take 1 capsule by mouth daily., Disp: , Rfl:  .  gabapentin (NEURONTIN) 300 MG capsule, 1 po qHS x 7 days, then 2 po qHS, Disp: , Rfl:  .  HYDROcodone-acetaminophen (NORCO) 10-325 MG tablet, Take 1 tablet by mouth every 6 (six) hours as needed., Disp: 100 tablet, Rfl: 0 .  lamoTRIgine (LAMICTAL) 25 MG tablet, Take 1 tablet (25 mg total) by mouth 2 (two) times daily., Disp: 60 tablet, Rfl: 0 .  lansoprazole (PREVACID) 15 MG capsule, TAKE ONE CAPSULE BY MOUTH TWICE A DAY 15 TO 30 MINUTES BEFORE MEALS, Disp: , Rfl: 3 .  meloxicam (MOBIC) 15 MG tablet, Take 15 mg by mouth daily., Disp: , Rfl:  .  MULTIPLE VITAMINS-MINERALS PO, Take 1 tablet by mouth daily. , Disp: , Rfl:  .  Omega-3 Fatty Acids (FISH OIL) 1200 MG CAPS, Take 2 capsules by mouth 2 (two) times daily. , Disp: , Rfl:  .  promethazine (PHENERGAN) 25 MG tablet, TAKE 1 TABLET (25 MG TOTAL) BY MOUTH EVERY 6 (SIX) HOURS AS NEEDED FOR NAUSEA., Disp: , Rfl: 0 .  sertraline (ZOLOFT) 100 MG tablet, Take 100 mg by mouth 2 (two) times daily. , Disp: , Rfl:  .  SUMAtriptan (IMITREX) 100 MG tablet, TAKE 1 AT HEADACHE ONSET, MAY REPEAT AFTER 2 HOURS, NO MORE THAN 2 IN A DAY,NO MORE THAN 4 IN A WEEK,  Disp: , Rfl:  .  Tetrahydrozoline HCl (EYE DROPS OP), Apply 1 drop to eye 2 (two) times daily. , Disp: , Rfl:  .  topiramate (TOPAMAX) 25 MG tablet, Take 25 mg by mouth daily. 1 tab;et in the morning, and 4 tablets at bedtime, Disp: , Rfl:  .  traZODone (DESYREL) 50 MG tablet, 50 mg at bedtime as needed. Reported on 10/04/2015, Disp: , Rfl:  .  triamcinolone (NASACORT) 55 MCG/ACT AERO nasal inhaler, Place 2 sprays into the nose daily., Disp: 1 Inhaler, Rfl: 12 .  VYVANSE 30 MG capsule, Take 1 capsule by mouth daily., Disp: , Rfl: 0  Patient Care Team: Mar Daring, PA-C as PCP - General (Family Medicine)     Objective:   Vitals: BP 106/70 (BP Location: Right Arm, Patient Position: Sitting, Cuff Size: Normal)   Pulse 80   Temp 98.2 F (36.8 C) (Oral)   Resp 16   Ht 5'  4" (1.626 m)   Wt 138 lb (62.6 kg)   BMI 23.69 kg/m   Physical Exam  Constitutional: She is oriented to person, place, and time. She appears well-developed and well-nourished. No distress.  HENT:  Head: Normocephalic and atraumatic.  Right Ear: Hearing, tympanic membrane and ear canal normal.  Left Ear: Hearing, tympanic membrane, external ear and ear canal normal.  Nose: Nose normal.  Mouth/Throat: Uvula is midline, oropharynx is clear and moist and mucous membranes are normal. No oropharyngeal exudate.  At the opening of the external meatus on the right ear at 9 o 'clock of the opening there is a tender nodule with what seems to be a central ulceration; no drainage or erythema noted. Patient did have MRI with Dr. Melrose Nakayama and there was an abnormality noted in this area that was questioned to be surgical change vs recurrence of skin cancer. With this nodule I suspect the latter.  Eyes: Conjunctivae and EOM are normal. Pupils are equal, round, and reactive to light. Right eye exhibits no discharge. Left eye exhibits no discharge. No scleral icterus.  Neck: Normal range of motion. Neck supple. No JVD present. Carotid  bruit is not present. No tracheal deviation present. No thyromegaly present.  Cardiovascular: Normal rate, regular rhythm, normal heart sounds and intact distal pulses.  Exam reveals no gallop and no friction rub.   No murmur heard. Pulmonary/Chest: Effort normal and breath sounds normal. No respiratory distress. She has no wheezes. She has no rales. She exhibits no tenderness. Right breast exhibits no inverted nipple, no mass, no nipple discharge, no skin change and no tenderness. Left breast exhibits no inverted nipple, no mass, no nipple discharge, no skin change and no tenderness. Breasts are symmetrical.  Abdominal: Soft. Bowel sounds are normal. She exhibits no distension and no mass. There is no tenderness. There is no rebound and no guarding. Hernia confirmed negative in the right inguinal area and confirmed negative in the left inguinal area.  Genitourinary: Rectum normal, vagina normal and uterus normal. No breast swelling, tenderness, discharge or bleeding. Pelvic exam was performed with patient supine. There is no rash, tenderness, lesion or injury on the right labia. There is no rash, tenderness, lesion or injury on the left labia. Cervix exhibits no motion tenderness, no discharge and no friability. Right adnexum displays no mass, no tenderness and no fullness. Left adnexum displays no mass, no tenderness and no fullness. No erythema, tenderness or bleeding in the vagina. No signs of injury around the vagina. No vaginal discharge found.  Musculoskeletal: Normal range of motion. She exhibits no edema or tenderness.  Lymphadenopathy:    She has no cervical adenopathy.       Right: No inguinal adenopathy present.       Left: No inguinal adenopathy present.  Neurological: She is alert and oriented to person, place, and time. She has normal reflexes. No cranial nerve deficit. Coordination normal.  Skin: Skin is warm and dry. No rash noted. She is not diaphoretic.  Psychiatric: She has a normal  mood and affect. Her behavior is normal. Judgment and thought content normal.  Vitals reviewed.   Activities of Daily Living In your present state of health, do you have any difficulty performing the following activities: 10/04/2016  Hearing? Y  Vision? Y  Difficulty concentrating or making decisions? Y  Walking or climbing stairs? Y  Dressing or bathing? N  Doing errands, shopping? N  Some recent data might be hidden    Fall Risk Assessment  Fall Risk  10/04/2016 11/08/2015 10/04/2015 02/15/2015 11/23/2014  Falls in the past year? Yes No No No No  Number falls in past yr: 2 or more - - - -  Injury with Fall? No - - - -  Risk Factor Category  High Fall Risk - - - -  Risk for fall due to : - Other (Comment) - Other (Comment) -     Depression Screen PHQ 2/9 Scores 10/04/2016 11/08/2015 10/04/2015 02/15/2015  PHQ - 2 Score 2 0 2 0  PHQ- 9 Score 13 - 11 -  Exception Documentation - Other- indicate reason in comment box - Other- indicate reason in comment box      Assessment & Plan:     Annual Wellness Visit  Reviewed patient's Family Medical History Reviewed and updated list of patient's medical providers Assessment of cognitive impairment was done Assessed patient's functional ability Established a written schedule for health screening [place] Completed and Reviewed  Exercise Activities and Dietary recommendations Goals    . Exercise 150 minutes per week (moderate activity)       Immunization History  Administered Date(s) Administered  . Influenza, Seasonal, Injecte, Preservative Fre 06/24/2015    Health Maintenance  Topic Date Due  . Hepatitis C Screening  1961-03-20  . HIV Screening  06/30/1975  . TETANUS/TDAP  06/30/1979  . PAP SMEAR  06/29/1981  . INFLUENZA VACCINE  04/23/2016  . MAMMOGRAM  10/19/2017  . COLONOSCOPY  08/17/2023     Discussed health benefits of physical activity, and encouraged her to engage in regular exercise  appropriate for her age and condition.    1. Medicare annual wellness visit, subsequent Normal physical exam with exception of right ear as noted above.   2. Encounter for lipid screening for cardiovascular disease Will check labs as below and f/u pending results. - Lipid Profile  3. Cervical cancer screening Pap collected today. Will send as below and f/u pending results. - Pap IG and HPV (high risk) DNA detection  4. Breast cancer screening Breast exam today was normal. There is family history of breast cancer. She does perform regular self breast exams. Mammogram was ordered as below. Information for [facility] clinic was given to patient so she may schedule her mammogram at her convenience. - MM Digital Screening; Future  5. Family history of breast cancer Family history of breast cancer in her sister. - MM Digital Screening; Future  6. Colon cancer screening OC lite collected today and was negative. - IFOBT POC (occult bld, rslt in office)  7. Diabetes mellitus screening Will check labs as below and f/u pending results. - HgB A1c  8. Chest pain, unspecified type Suspect anxiety component. EKG today in the office showed NSR with rate of 69. Will refer to cardiology for consideration of stress test to r/o cardiac source. No chest pain or DOE noted during regular exercise.  - EKG 12-Lead - CBC w/Diff/Platelet - Ambulatory referral to Cardiology  9. Hypercholesterolemia without hypertriglyceridemia Diet controlled. Will check labs as below and f/u pending results. - Comprehensive Metabolic Panel (CMET)  10. Anemia, unspecified type H/O this. Will check labs as below and f/u pending results. - CBC w/Diff/Platelet  11. Earlobe lesion, right See physical exam note. Patient does have h/o basal cell carcinoma in the right ear and had recent abnormal MRI. Patient is to call Dr. Manley Mason for f/u.   12. Hepatic cyst Being followed by GI now.  - Comprehensive Metabolic  Panel (CMET)  13. High risk medication use Will check labs as below and f/u pending results.  - Comprehensive Metabolic Panel (CMET)  14. Acute anxiety Suspect anxiety as cause of chest discomfort. Will check labs as below and f/u pending results. Followed by Dr. Nicolasa Ducking.  - TSH  15. Need for influenza vaccination Flu vaccine given today without complication. Patient sat upright for 15 minutes to check for adverse reaction before being released. - Flu Vaccine QUAD 36+ mos PF IM (Fluarix & Fluzone Quad PF)  ------------------------------------------------------------------------------------------------------------  Patient seen and examined by Mar Daring, PA-C, and note scribed by Renaldo Fiddler, CMA.  Mar Daring, PA-C  [facility]

## 2016-10-05 LAB — CBC WITH DIFFERENTIAL/PLATELET
BASOS ABS: 0 10*3/uL (ref 0.0–0.2)
Basos: 0 %
EOS (ABSOLUTE): 0.1 10*3/uL (ref 0.0–0.4)
Eos: 1 %
Hematocrit: 33.4 % — ABNORMAL LOW (ref 34.0–46.6)
Hemoglobin: 10.8 g/dL — ABNORMAL LOW (ref 11.1–15.9)
IMMATURE GRANS (ABS): 0 10*3/uL (ref 0.0–0.1)
IMMATURE GRANULOCYTES: 0 %
LYMPHS: 34 %
Lymphocytes Absolute: 2.4 10*3/uL (ref 0.7–3.1)
MCH: 29.8 pg (ref 26.6–33.0)
MCHC: 32.3 g/dL (ref 31.5–35.7)
MCV: 92 fL (ref 79–97)
Monocytes Absolute: 0.5 10*3/uL (ref 0.1–0.9)
Monocytes: 7 %
NEUTROS PCT: 58 %
Neutrophils Absolute: 4 10*3/uL (ref 1.4–7.0)
PLATELETS: 265 10*3/uL (ref 150–379)
RBC: 3.62 x10E6/uL — ABNORMAL LOW (ref 3.77–5.28)
RDW: 13.7 % (ref 12.3–15.4)
WBC: 6.9 10*3/uL (ref 3.4–10.8)

## 2016-10-05 LAB — HEMOGLOBIN A1C
Est. average glucose Bld gHb Est-mCnc: 94 mg/dL
HEMOGLOBIN A1C: 4.9 % (ref 4.8–5.6)

## 2016-10-05 LAB — COMPREHENSIVE METABOLIC PANEL
ALT: 25 IU/L (ref 0–32)
AST: 24 IU/L (ref 0–40)
Albumin/Globulin Ratio: 2.7 — ABNORMAL HIGH (ref 1.2–2.2)
Albumin: 4.6 g/dL (ref 3.5–5.5)
Alkaline Phosphatase: 74 IU/L (ref 39–117)
BILIRUBIN TOTAL: 0.4 mg/dL (ref 0.0–1.2)
BUN/Creatinine Ratio: 18 (ref 9–23)
BUN: 14 mg/dL (ref 6–24)
CALCIUM: 9.4 mg/dL (ref 8.7–10.2)
CHLORIDE: 102 mmol/L (ref 96–106)
CO2: 25 mmol/L (ref 18–29)
Creatinine, Ser: 0.79 mg/dL (ref 0.57–1.00)
GFR, EST AFRICAN AMERICAN: 97 mL/min/{1.73_m2} (ref >59)
GFR, EST NON AFRICAN AMERICAN: 84 mL/min/{1.73_m2} (ref >59)
GLUCOSE: 92 mg/dL (ref 65–99)
Globulin, Total: 1.7 g/dL (ref 1.5–4.5)
Potassium: 3.6 mmol/L (ref 3.5–5.2)
Sodium: 142 mmol/L (ref 134–144)
TOTAL PROTEIN: 6.3 g/dL (ref 6.0–8.5)

## 2016-10-05 LAB — LIPID PANEL
CHOLESTEROL TOTAL: 173 mg/dL (ref 100–199)
Chol/HDL Ratio: 2.8 ratio units (ref 0.0–4.4)
HDL: 62 mg/dL (ref >39)
LDL Calculated: 96 mg/dL (ref 0–99)
Triglycerides: 73 mg/dL (ref 0–149)
VLDL CHOLESTEROL CAL: 15 mg/dL (ref 5–40)

## 2016-10-05 LAB — TSH: TSH: 0.818 u[IU]/mL (ref 0.450–4.500)

## 2016-10-06 LAB — IFOBT (OCCULT BLOOD): IFOBT: NEGATIVE

## 2016-10-08 ENCOUNTER — Encounter: Payer: Self-pay | Admitting: Internal Medicine

## 2016-10-08 ENCOUNTER — Ambulatory Visit (INDEPENDENT_AMBULATORY_CARE_PROVIDER_SITE_OTHER): Payer: BLUE CROSS/BLUE SHIELD | Admitting: Internal Medicine

## 2016-10-08 VITALS — BP 126/74 | HR 62 | Ht 64.0 in | Wt 133.5 lb

## 2016-10-08 DIAGNOSIS — R0789 Other chest pain: Secondary | ICD-10-CM

## 2016-10-08 NOTE — Progress Notes (Signed)
New Outpatient Visit Date: 10/08/2016  Referring Provider: Mar Daring, PA-C [address] [place], [place] [address]  Chief Complaint: Chest pain  HPI:  Krystal Simpson is a 56 y.o. 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 Marlyn Corporal for evaluation of chest pain. The patient reports over a year of intermittent sharp chest pains than on last a few moments. However, since Thanksgiving (approximately 2 months ago), she has had intermittent episodes of chest tightness. Most episodes last for 10-15 seconds before resolving spontaneously. However, about 2-3 weeks ago, she had a more severe episode lasting up to 30 seconds while standing in her kitchen. She denies associated symptoms including shortness of breath, nausea, palpitations, and lightheadedness. At its worst, the patient was 3. 5-4/10 in intensity. The pain did not radiate. She often notices herself grinding her teeth or clenching her fists when the pain occurs. Sometimes, the tightness also develops when she "rushes." [name] exercises and walks her dog regularly and has not noticed any symptoms with these activities. Currently, she experiences brief episodes of chest tightness a few times per week but not on a daily basis. She denies undergoing prior cardiovascular testing beyond an EKG.  --------------------------------------------------------------------------------------------------  Cardiovascular History & Procedures: Cardiovascular Problems:  Atypical chest pain  Risk Factors:  Family history  Cath/PCI:  None  CV Surgery:  None  EP Procedures and Devices:  None  Non-Invasive Evaluation(s):  None  Recent CV Pertinent Labs: Lab Results  Component Value Date   CHOL 173 10/04/2016   HDL 62 10/04/2016   LDLCALC 96  10/04/2016   TRIG 73 10/04/2016   CHOLHDL 2.8 10/04/2016   K 3.6 10/04/2016   BUN 14 10/04/2016   CREATININE 0.79 10/04/2016    --------------------------------------------------------------------------------------------------  Past Medical History:  Diagnosis Date  . Abnormal Pap smear of cervix   . Anxiety   . Cancer [facility])    multiple different areas, squamous cell and basal cell skin ca  . Depression   . History of kidney stones     Past Surgical History:  Procedure Laterality Date  . BASAL CELL CARCINOMA EXCISION  2014  . BASAL CELL CARCINOMA EXCISION  09/20/2015   Dr. Evorn Gong, left mid forearm  . MOHS SURGERY  2015   top of head Dr. Shelda Jakes at [facility]  . SQUAMOUS CELL CARCINOMA EXCISION  2014    Outpatient Encounter Prescriptions as of 10/08/2016  Medication Sig  . Cholecalciferol 1000 UNITS capsule Take 1,000 Units by mouth 2 (two) times daily.   . clonazePAM (KLONOPIN) 0.25 MG disintegrating tablet   . CYANOCOBALAMIN PO Take 2 tablets by mouth daily.   . diclofenac sodium (VOLTAREN) 1 % GEL Apply 2 g topically 4 (four) times daily.  . DULoxetine (CYMBALTA) 30 MG capsule Take 30 mg by mouth daily.  . DULoxetine (CYMBALTA) 60 MG capsule Take 60 mg by mouth daily.   . Flaxseed, Linseed, (FLAX SEEDS PO) Take 1 capsule by mouth daily.  Marland Kitchen gabapentin (NEURONTIN) 300 MG capsule 1 po qHS x 7 days, then 2 po qHS  . HYDROcodone-acetaminophen (NORCO/VICODIN) 5-325 MG tablet Take 1 tablet by mouth every 6 (six) hours as needed for moderate pain.  Marland Kitchen lamoTRIgine (LAMICTAL) 25 MG tablet Take 1 tablet (25 mg total) by mouth 2 (two) times daily.  . lansoprazole (PREVACID) 15 MG  capsule TAKE ONE CAPSULE BY MOUTH TWICE A DAY 15 TO 30 MINUTES BEFORE MEALS  . meloxicam (MOBIC) 15 MG tablet Take 15 mg by mouth daily.  . MULTIPLE VITAMINS-MINERALS PO Take 1 tablet by mouth daily.   . Omega-3 Fatty Acids (FISH OIL) 1200 MG CAPS Take 2 capsules by mouth 2 (two) times daily.   . promethazine  (PHENERGAN) 25 MG tablet TAKE 1 TABLET (25 MG TOTAL) BY MOUTH EVERY 6 (SIX) HOURS AS NEEDED FOR NAUSEA.  Marland Kitchen sertraline (ZOLOFT) 100 MG tablet Take 100 mg by mouth 2 (two) times daily.   . SUMAtriptan (IMITREX) 100 MG tablet TAKE 1 AT HEADACHE ONSET, MAY REPEAT AFTER 2 HOURS, NO MORE THAN 2 IN A DAY,NO MORE THAN 4 IN A WEEK  . Tetrahydrozoline HCl (EYE DROPS OP) Apply 1 drop to eye 2 (two) times daily.   Marland Kitchen topiramate (TOPAMAX) 25 MG tablet Take 25 mg by mouth daily. 1 tab;et in the morning, and 4 tablets at bedtime  . traZODone (DESYREL) 50 MG tablet 50 mg at bedtime as needed. Reported on 10/04/2015  . triamcinolone (NASACORT) 55 MCG/ACT AERO nasal inhaler Place 2 sprays into the nose daily.  Marland Kitchen VYVANSE 30 MG capsule Take 1 capsule by mouth daily.  . [DISCONTINUED] HYDROcodone-acetaminophen (NORCO) 10-325 MG tablet Take 1 tablet by mouth every 6 (six) hours as needed. (Patient not taking: Reported on 10/08/2016)   No facility-administered encounter medications on file as of 10/08/2016.     Allergies: Ciprofloxacin and Tetanus toxoids  Social History   Social History  . Marital status: Married    Spouse name: Abbe Amsterdam  . Number of children: 2  . Years of education: some college   Occupational History  . disabled    Social History Main Topics  . Smoking status: Never Smoker  . Smokeless tobacco: Never Used  . Alcohol use No  . Drug use: No  . Sexual activity: Not Currently   Other Topics Concern  . Not on file   Social History Narrative  . No narrative on file    Family History  Problem Relation Age of Onset  . Emphysema Mother   . Lung cancer Father   . Healthy Sister   . Heart attack Sister 87  . Heart attack Maternal Grandmother   . Breast cancer Sister     Review of Systems: Patient notices intermittent hot flashes and night sweats, particularly when eating spicy food or pizza shortly before going to bed. Otherwise, a 12-system review of systems was performed and was  negative except as noted in the HPI.  --------------------------------------------------------------------------------------------------  Physical Exam: BP 126/74 (BP Location: Right Arm, Patient Position: Sitting, Cuff Size: Normal)   Pulse 62   Ht 5\' 4"  (1.626 m)   Wt 133 lb 8 oz (60.6 kg)   BMI 22.92 kg/m   General:  Well-developed, well-nourished woman seated comfortably in the exam room. HEENT: No conjunctival pallor or scleral icterus.  Moist mucous membranes.  OP clear. Neck: Supple without lymphadenopathy, thyromegaly, JVD, or HJR.  No carotid bruit. Lungs: Normal work of breathing.  Clear to auscultation bilaterally without wheezes or crackles. Heart: Regular rate and rhythm without murmurs, rubs, or gallops.  Non-displaced PMI. Abd: Bowel sounds present.  Soft, NT/ND without hepatosplenomegaly Ext: No lower extremity edema.  Radial, PT, and DP pulses are 2+ bilaterally Skin: warm and dry without rash Neuro: CNIII-XII intact.  Strength and fine-touch sensation intact in upper and lower extremities bilaterally. Psych: Normal mood and  affect.  EKG (10/04/16):  Normal sinus rhythm without significant abnormalities. PR interval is slightly longer compared with prior tracing from 10/04/15 (I have personally reviewed both tracings).  Lab Results  Component Value Date   WBC 6.9 10/04/2016   HGB 13.5 09/30/2014   HCT 33.4 (L) 10/04/2016   MCV 92 10/04/2016   PLT 265 10/04/2016    Lab Results  Component Value Date   NA 142 10/04/2016   K 3.6 10/04/2016   CL 102 10/04/2016   CO2 25 10/04/2016   BUN 14 10/04/2016   CREATININE 0.79 10/04/2016   GLUCOSE 92 10/04/2016   ALT 25 10/04/2016    Lab Results  Component Value Date   CHOL 173 10/04/2016   HDL 62 10/04/2016   LDLCALC 96 10/04/2016   TRIG 73 10/04/2016   CHOLHDL 2.8 10/04/2016   --------------------------------------------------------------------------------------------------  ASSESSMENT AND PLAN: Atypical  chest pain The patient's symptoms are predominantly nonexertional and very brief, lasting only up to 30 seconds. I have a low suspicion for significant coronary artery disease and favor a musculoskeletal or GI cause. However, given Ms. Linn's remote history of obesity as well as a family history of premature coronary artery disease, we have agreed to perform an exercise tolerance test. Pending this, I have recommended the patient begin taking low-dose aspirin.  Nelva Bush, MD 10/08/2016 3:10 PM

## 2016-10-08 NOTE — Patient Instructions (Addendum)
Medication Instructions:  Your physician recommends that you continue on your current medications as directed. Please refer to the Current Medication list given to you today.   Labwork: none  Testing/Procedures: Your physician has requested that you have an exercise tolerance test. For further information please visit [email or web address]. Please also follow instruction sheet, as given.    Follow-Up: Your physician recommends that you schedule a follow-up appointment in: 3 months with Dr. Saunders Revel.    Any Other Special Instructions Will Be Listed Below (If Applicable).     If you need a refill on your cardiac medications before your next appointment, please call your pharmacy.   Exercise Stress Electrocardiogram An exercise stress electrocardiogram is a test to check how blood flows to your heart. It is done to find areas of poor blood flow. You will need to walk on a treadmill for this test. The electrocardiogram will record your heartbeat when you are at rest and when you are exercising. What happens before the procedure?  Do not have drinks with caffeine or foods with caffeine for 24 hours before the test, or as told by your doctor. This includes coffee, tea (even decaf tea), sodas, chocolate, and cocoa.  Follow your doctor's instructions about eating and drinking before the test.  Ask your doctor what medicines you should or should not take before the test. Take your medicines with water unless told by your doctor not to.  If you use an inhaler, bring it with you to the test.  Bring a snack to eat after the test.  Do not  smoke for 4 hours before the test.  Do not put lotions, powders, creams, or oils on your chest before the test.  Wear comfortable shoes and clothing. What happens during the procedure?  You will have patches put on your chest. Small areas of your chest may need to be shaved. Wires will be connected to the patches.  Your heart rate will be watched while  you are resting and while you are exercising.  You will walk on the treadmill. The treadmill will slowly get faster to raise your heart rate.  The test will take about 1-2 hours. What happens after the procedure?  Your heart rate and blood pressure will be watched after the test.  You may return to your normal diet, activities, and medicines or as told by your doctor. This information is not intended to replace advice given to you by your health care provider. Make sure you discuss any questions you have with your health care provider. Document Released: 02/26/2008 Document Revised: 05/08/2016 Document Reviewed: 05/17/2013 Elsevier Interactive Patient Education  2017 [employer].  Exercise Stress Electrocardiogram An exercise stress electrocardiogram is a test to check how blood flows to your heart. It is done to find areas of poor blood flow. You will need to walk on a treadmill for this test. The electrocardiogram will record your heartbeat when you are at rest and when you are exercising. What happens before the procedure?  Do not have drinks with caffeine or foods with caffeine for 24 hours before the test, or as told by your doctor. This includes coffee, tea (even decaf tea), sodas, chocolate, and cocoa.  Follow your doctor's instructions about eating and drinking before the test.  Ask your doctor what medicines you should or should not take before the test. Take your medicines with water unless told by your doctor not to.  If you use an inhaler, bring it with you to the test.  Bring a snack to eat after the test.  Do not  smoke for 4 hours before the test.  Do not put lotions, powders, creams, or oils on your chest before the test.  Wear comfortable shoes and clothing. What happens during the procedure?  You will have patches put on your chest. Small areas of your chest may need to be shaved. Wires will be connected to the patches.  Your heart rate will be watched while  you are resting and while you are exercising.  You will walk on the treadmill. The treadmill will slowly get faster to raise your heart rate.  The test will take about 1-2 hours. What happens after the procedure?  Your heart rate and blood pressure will be watched after the test.  You may return to your normal diet, activities, and medicines or as told by your doctor. This information is not intended to replace advice given to you by your health care provider. Make sure you discuss any questions you have with your health care provider. Document Released: 02/26/2008 Document Revised: 05/08/2016 Document Reviewed: 05/17/2013 Elsevier Interactive Patient Education  2017 [employer].

## 2016-10-14 DIAGNOSIS — C44619 Basal cell carcinoma of skin of left upper limb, including shoulder: Secondary | ICD-10-CM | POA: Diagnosis not present

## 2016-10-14 DIAGNOSIS — L7211 Pilar cyst: Secondary | ICD-10-CM | POA: Diagnosis not present

## 2016-10-15 LAB — PAP IG AND HPV HIGH-RISK
HPV, HIGH-RISK: NEGATIVE
PAP Smear Comment: 0

## 2016-10-16 ENCOUNTER — Ambulatory Visit (INDEPENDENT_AMBULATORY_CARE_PROVIDER_SITE_OTHER): Payer: BLUE CROSS/BLUE SHIELD

## 2016-10-16 ENCOUNTER — Telehealth: Payer: Self-pay

## 2016-10-16 DIAGNOSIS — R0789 Other chest pain: Secondary | ICD-10-CM

## 2016-10-16 LAB — EXERCISE TOLERANCE TEST
CSEPED: 7 min
CSEPEDS: 28 s
CSEPEW: 9.2 METS
CSEPHR: 85 %
MPHR: 164 {beats}/min
Peak HR: 141 {beats}/min
Rest HR: 90 {beats}/min

## 2016-10-16 NOTE — Telephone Encounter (Signed)
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 Per ACOG and ASCCP guidelines we will repeat pap in one year.

## 2016-10-16 NOTE — Telephone Encounter (Signed)
Left message to call back  

## 2016-10-16 NOTE — Telephone Encounter (Signed)
Most likely no it is not related. Squamous cell is common in the vaginal cavity and across the body. The cells they saw look atypical but are not cancerous or even changed enough to be called dysplasia.  She can start iron supplement (ferrous sulfate) 325mg  once daily. May cause GI upset and dark stools. We can recheck CBC in 6 weeks.

## 2016-10-16 NOTE — Telephone Encounter (Signed)
Advised patient of results. Patient reports that she has history of skin cancer and she has had several places removed. She was wanting to know if her cells in her cervix could have the same cancer that was on her arms and face?   Patient also reports that she saw her cardiologist last week, and he mentioned to her that she was mildly anemic. Does this need to be treated? Please advise. Thanks!

## 2016-10-17 ENCOUNTER — Telehealth: Payer: Self-pay | Admitting: Physician Assistant

## 2016-10-17 DIAGNOSIS — Z803 Family history of malignant neoplasm of breast: Secondary | ICD-10-CM

## 2016-10-17 DIAGNOSIS — Z1239 Encounter for other screening for malignant neoplasm of breast: Secondary | ICD-10-CM

## 2016-10-17 NOTE — Telephone Encounter (Signed)
Pt states she had her CPE 10/04/2016 and called [facility] to have her mammogram.  Due to having burning with her left breast [facility] is requesting an order.  CB#[phone]/[id number]

## 2016-10-18 NOTE — Telephone Encounter (Signed)
Also see other message-aa

## 2016-10-18 NOTE — Telephone Encounter (Signed)
Need to know where is pain is so insurance will cover.    LMTCB 10/18/2016  Thanks,   -Mickel Baas

## 2016-10-18 NOTE — Telephone Encounter (Signed)
Also see other message=aa

## 2016-10-18 NOTE — Telephone Encounter (Signed)
Signed orders per [facility] request. Let us know if there is problem.

## 2016-10-21 ENCOUNTER — Other Ambulatory Visit: Payer: Self-pay | Admitting: Physician Assistant

## 2016-10-21 DIAGNOSIS — Z85828 Personal history of other malignant neoplasm of skin: Secondary | ICD-10-CM | POA: Diagnosis not present

## 2016-10-21 DIAGNOSIS — D485 Neoplasm of uncertain behavior of skin: Secondary | ICD-10-CM | POA: Diagnosis not present

## 2016-10-21 DIAGNOSIS — L578 Other skin changes due to chronic exposure to nonionizing radiation: Secondary | ICD-10-CM | POA: Diagnosis not present

## 2016-10-21 DIAGNOSIS — N644 Mastodynia: Secondary | ICD-10-CM

## 2016-10-21 DIAGNOSIS — L814 Other melanin hyperpigmentation: Secondary | ICD-10-CM | POA: Diagnosis not present

## 2016-10-21 DIAGNOSIS — L910 Hypertrophic scar: Secondary | ICD-10-CM | POA: Diagnosis not present

## 2016-10-21 DIAGNOSIS — L908 Other atrophic disorders of skin: Secondary | ICD-10-CM | POA: Diagnosis not present

## 2016-10-21 NOTE — Telephone Encounter (Signed)
Pt advised. Pt agrees with treatment plan. Pt will call back to check CBC. Renaldo Fiddler, CMA

## 2016-10-21 NOTE — Telephone Encounter (Signed)
Pt reports she is experiencing a burning pain on her left breast at the 9 o'clock position. Renaldo Fiddler, CMA

## 2016-10-21 NOTE — Progress Notes (Signed)
Put in new [place] for left breast

## 2016-10-21 NOTE — Telephone Encounter (Signed)
Burning in left breast @ 9 oclock position.

## 2016-10-22 ENCOUNTER — Other Ambulatory Visit: Payer: Self-pay | Admitting: Physician Assistant

## 2016-10-22 DIAGNOSIS — N644 Mastodynia: Secondary | ICD-10-CM

## 2016-10-25 DIAGNOSIS — M5441 Lumbago with sciatica, right side: Secondary | ICD-10-CM | POA: Diagnosis not present

## 2016-10-25 DIAGNOSIS — G8929 Other chronic pain: Secondary | ICD-10-CM | POA: Diagnosis not present

## 2016-10-28 DIAGNOSIS — F411 Generalized anxiety disorder: Secondary | ICD-10-CM | POA: Diagnosis not present

## 2016-10-28 DIAGNOSIS — F5081 Binge eating disorder: Secondary | ICD-10-CM | POA: Diagnosis not present

## 2016-10-28 DIAGNOSIS — F330 Major depressive disorder, recurrent, mild: Secondary | ICD-10-CM | POA: Diagnosis not present

## 2016-10-28 DIAGNOSIS — R69 Illness, unspecified: Secondary | ICD-10-CM | POA: Diagnosis not present

## 2016-10-28 DIAGNOSIS — F4312 Post-traumatic stress disorder, chronic: Secondary | ICD-10-CM | POA: Diagnosis not present

## 2016-10-30 DIAGNOSIS — M5441 Lumbago with sciatica, right side: Secondary | ICD-10-CM | POA: Diagnosis not present

## 2016-10-30 DIAGNOSIS — G8929 Other chronic pain: Secondary | ICD-10-CM | POA: Diagnosis not present

## 2016-11-05 ENCOUNTER — Ambulatory Visit
Admission: RE | Admit: 2016-11-05 | Discharge: 2016-11-05 | Disposition: A | Discharge status: Home / Self Care | Payer: Medicare HMO | Source: Ambulatory Visit | Attending: Physician Assistant | Admitting: Physician Assistant

## 2016-11-05 ENCOUNTER — Other Ambulatory Visit: Payer: Self-pay

## 2016-11-05 ENCOUNTER — Other Ambulatory Visit: Payer: Self-pay | Admitting: Physician Assistant

## 2016-11-05 DIAGNOSIS — N644 Mastodynia: Secondary | ICD-10-CM | POA: Diagnosis not present

## 2016-11-05 DIAGNOSIS — R922 Inconclusive mammogram: Secondary | ICD-10-CM | POA: Diagnosis not present

## 2016-11-05 DIAGNOSIS — M6788 Other specified disorders of synovium and tendon, other site: Secondary | ICD-10-CM

## 2016-11-05 NOTE — Telephone Encounter (Signed)
Patient advised as directed. Patient is requesting Refill on the Hydrocodone-Acetaminophen. Thanks,  -Joseline

## 2016-11-05 NOTE — Telephone Encounter (Signed)
-----   Message from Mar Daring, [place] sent at 11/05/2016 12:26 PM EST ----- Normal mammogram. Repeat screening in one year.

## 2016-11-06 DIAGNOSIS — G8929 Other chronic pain: Secondary | ICD-10-CM | POA: Diagnosis not present

## 2016-11-06 DIAGNOSIS — M5441 Lumbago with sciatica, right side: Secondary | ICD-10-CM | POA: Diagnosis not present

## 2016-11-06 MED ORDER — HYDROCODONE-ACETAMINOPHEN 5-325 MG PO TABS: 1.0000 | ORAL_TABLET | Freq: Four times a day (QID) | ORAL | 0 refills | Status: DC | PRN

## 2016-11-06 NOTE — Telephone Encounter (Signed)
Patient advised that Rx is up front ready for pick up.  Thanks,  -Sanjuanita Condrey

## 2016-11-06 NOTE — Telephone Encounter (Signed)
Please notify patient Rx printed.

## 2016-11-12 DIAGNOSIS — M5441 Lumbago with sciatica, right side: Secondary | ICD-10-CM | POA: Diagnosis not present

## 2016-11-12 DIAGNOSIS — G8929 Other chronic pain: Secondary | ICD-10-CM | POA: Diagnosis not present

## 2016-11-20 DIAGNOSIS — M5126 Other intervertebral disc displacement, lumbar region: Secondary | ICD-10-CM | POA: Diagnosis not present

## 2016-11-20 DIAGNOSIS — M5416 Radiculopathy, lumbar region: Secondary | ICD-10-CM | POA: Diagnosis not present

## 2016-11-20 DIAGNOSIS — M25551 Pain in right hip: Secondary | ICD-10-CM | POA: Diagnosis not present

## 2016-11-20 DIAGNOSIS — M5414 Radiculopathy, thoracic region: Secondary | ICD-10-CM | POA: Diagnosis not present

## 2016-11-26 ENCOUNTER — Other Ambulatory Visit: Payer: Self-pay | Admitting: Physician Assistant

## 2016-11-26 DIAGNOSIS — G8929 Other chronic pain: Secondary | ICD-10-CM | POA: Diagnosis not present

## 2016-11-26 DIAGNOSIS — M6788 Other specified disorders of synovium and tendon, other site: Secondary | ICD-10-CM

## 2016-11-26 DIAGNOSIS — M5441 Lumbago with sciatica, right side: Secondary | ICD-10-CM | POA: Diagnosis not present

## 2016-11-27 MED ORDER — HYDROCODONE-ACETAMINOPHEN 10-325 MG PO TABS: 1.0000 | ORAL_TABLET | ORAL | 0 refills | Status: DC | PRN

## 2016-11-27 NOTE — Telephone Encounter (Signed)
Rx printed

## 2016-11-29 ENCOUNTER — Other Ambulatory Visit: Payer: Self-pay

## 2016-11-29 MED ORDER — HYDROCODONE-ACETAMINOPHEN 10-325 MG PO TABS: 1.0000 | ORAL_TABLET | ORAL | 0 refills | Status: DC | PRN

## 2016-12-03 DIAGNOSIS — M5441 Lumbago with sciatica, right side: Secondary | ICD-10-CM | POA: Diagnosis not present

## 2016-12-03 DIAGNOSIS — G8929 Other chronic pain: Secondary | ICD-10-CM | POA: Diagnosis not present

## 2016-12-11 DIAGNOSIS — R69 Illness, unspecified: Secondary | ICD-10-CM | POA: Diagnosis not present

## 2016-12-26 ENCOUNTER — Encounter: Payer: Self-pay | Admitting: Sports Medicine

## 2016-12-26 ENCOUNTER — Ambulatory Visit (INDEPENDENT_AMBULATORY_CARE_PROVIDER_SITE_OTHER): Payer: Medicare HMO | Admitting: Sports Medicine

## 2016-12-26 DIAGNOSIS — M5431 Sciatica, right side: Secondary | ICD-10-CM

## 2016-12-26 DIAGNOSIS — M7661 Achilles tendinitis, right leg: Secondary | ICD-10-CM | POA: Diagnosis not present

## 2016-12-26 DIAGNOSIS — M7662 Achilles tendinitis, left leg: Secondary | ICD-10-CM

## 2016-12-26 NOTE — Progress Notes (Signed)
[facility] Progress Note  Subjective:  Krystal Simpson is a 56 y.o. female who presents for follow-up of Achilles tendinitis 2/2 taking ciprofloxacin about 3 years ago. Her preference had been for conservative management over surgery; she has been doing heel raise exercises daily. She reports her pain is nearly gone. Her R Achilles hurts sometime when she is more active at the gym but improves if she takes it easy the next day. Currently, she goes to the gym about 3 times a week and will walk on the treadmill for about 45 minutes, do squats, and lift light weights. She also walks her dog for about 45 minutes a day. She is on several pain medications, including gabapentin and oxycodone-acetaminophen, as well as cymbalta 90 mg for new issue of R-sided sciatica.   Regarding sciatica, patient began having issues late last summer/early fall. She had a MRI Aug. 2017 that showed mild foraminal narrowing on right at T4-5. She continues to have pain that "feels like it's deep in my bone" that radiates from R lower back to anterior thigh down R lower leg. She is participating in PT and has tried spinal injections, without relief. She says she did not have back pain until she lost weight (was at 215 lbs in early 2016).   ROS: No bladder or bowel incontinence, no falls  Objective: Blood pressure (!) 97/46, height 5\' 4"  (1.626 m), weight 138 lb (62.6 kg). Body mass index is 23.69 kg/m. Constitutional: Well-appearing, very pleasant female in NAD Pulmonary/Chest: No respiratory distress.  Musculoskeletal: Only mild point tenderness with squeezing over mid Achilles bilaterally. No pain with dorsi or plantar flexion bilaterally. No longer has nodules. TTP over upper lumbar mid-spine and low lumbar/sacral spine. Pain with squeezing of anterior thigh. Legs symmetric.  Neurological: LE strength intact, 5/5 bilaterally, including hip abductors.  Skin: Skin is warm and dry. No rash noted.  Psychiatric:  Normal mood and affect.  Vitals reviewed  Ultrasound Bilateral Achilles Tendon  Right achilles thickness 0.6 cm (compared to 0.97 when first seen at [facility]). No hypoechoic disruption. tis is 4 cm above calcaneus at level of prior nodule. On tranverse scan the hypoechoic changes within the tendon have almost completely resolved. Width has decreased from 1.8 cms to 1.2 cms  Left achilles thickness 0.55 cm (compared to 1.03 cm when first seen at [facility]). No hypoechoic disruption.   Transverse scan shows almost complete resolution of hypoechoic changes and decreased width of tendon.  Impression: both Achilles tendons have returned to normal size and normal fibrillary appearance  Ultrasound and interpretation by Wolfgang Phoenix. Fields, MD   Assessment/Plan: Achilles tendonitis, bilateral - Nearly completely resolved. Normal thickness on [place]. - Can discontinue ankle compression sleeves. - No limitations on activities unless has increasing pain.   Sciatica of right side - Concern for a peripheral sciatica given ongoing symptoms and no response to spinal injections - Plan to [place] R hip to look for impingement at R piriformis  Follow-up at earliest convenience to continue work-up of R-sided sciatica.  Olene Floss, MD [place], PGY-2  I observed and examined the patient with the resident and agree with assessment and plan.  Note reviewed and modified by me. Stefanie Libel, MD

## 2016-12-26 NOTE — Assessment & Plan Note (Signed)
-   Nearly completely resolved. Normal thickness on [place]. - Can discontinue ankle compression sleeves. - No limitations on activities unless has increasing pain.

## 2016-12-26 NOTE — Assessment & Plan Note (Signed)
-   Concern for a peripheral sciatica given ongoing symptoms and no response to spinal injections - Plan to [place] R hip to look for impingement at R piriformis

## 2017-01-02 ENCOUNTER — Other Ambulatory Visit: Payer: Self-pay | Admitting: Otolaryngology

## 2017-01-02 DIAGNOSIS — H9201 Otalgia, right ear: Secondary | ICD-10-CM

## 2017-01-02 DIAGNOSIS — H6121 Impacted cerumen, right ear: Secondary | ICD-10-CM | POA: Diagnosis not present

## 2017-01-02 DIAGNOSIS — Z85828 Personal history of other malignant neoplasm of skin: Secondary | ICD-10-CM | POA: Diagnosis not present

## 2017-01-09 ENCOUNTER — Encounter: Payer: Self-pay | Admitting: Sports Medicine

## 2017-01-09 ENCOUNTER — Ambulatory Visit: Payer: Self-pay

## 2017-01-09 ENCOUNTER — Ambulatory Visit (INDEPENDENT_AMBULATORY_CARE_PROVIDER_SITE_OTHER): Payer: Medicare HMO | Admitting: Sports Medicine

## 2017-01-09 VITALS — BP 107/62 | Ht 64.0 in | Wt 138.0 lb

## 2017-01-09 DIAGNOSIS — M533 Sacrococcygeal disorders, not elsewhere classified: Secondary | ICD-10-CM | POA: Insufficient documentation

## 2017-01-09 DIAGNOSIS — M5431 Sciatica, right side: Secondary | ICD-10-CM

## 2017-01-09 MED ORDER — METHYLPREDNISOLONE ACETATE 40 MG/ML IJ SUSP
40.0000 mg | Freq: Once | INTRAMUSCULAR | Status: AC
  Administered 2017-01-09: 40 mg via INTRA_ARTICULAR

## 2017-01-09 NOTE — Assessment & Plan Note (Signed)
-   Suspect patient's ongoing nerve type pain is 2/2 sciatic nerve irritation at the SI joint. - Injected R SI joint as above. - Counseled patient on exercises (standing rotations, pretzel stretches, crunches, hip raises) - Hopeful that patient can reduce medication burden, as do not think ongoing symptoms are arising from spine

## 2017-01-09 NOTE — Progress Notes (Signed)
[facility] Progress Note  Subjective:  Krystal Simpson is a 56 y.o. female with history of Achilles tendinitis 2/2 cipro, skin cancer, and sciatica who presents for ongoing back pain.   Symptoms began late last summer. She has mid-back pain that radiates down her lower spine. She reports burning pain down her right leg and a deep "bony" pain in her R anterior thigh. She can elicit the right thigh pain by pressing deeply on it; squats, walking (especially on hard surfaces) and pressure make it worse. She cannot elicit the burning pain down her leg; it often wakes her up at night. Her right thigh pain is not improved by gabapentin, oxycodone-acetaminophen (now up to 10 mg at a time), or cymbalta. She had a MRI Aug. 2017 that showed mild foraminal narrowing on right at T4-5. She has been seen by Spine Specialist Dr. Sharlet Salina at [facility] and had spine injections that did not help, last given about 6 months ago.   She has worked with PT over 5 times and has been discharged with home exercises, from which she has seen little improvement. Her back pain is somewhat improved by extending her back and heat. She says she has a "sway" back and her natural tendency is to push her stomach out as she walks. She plans to try a TENS unit soon.   ROS: No falls, no rashes, no cough or sneeze pain  Objective: Blood pressure 107/62, height 5\' 4"  (1.626 m), weight 138 lb (62.6 kg). Constitutional: Well-appearing female, in NAD Pulmonary/Chest: No respiratory distress.  Musculoskeletal: Spine appears well-aligned with flexion. Tone of thighs appears equal. Mild TTP over portions of cervical and thoracic spine.  Point TTP over R SI joint. Pain with squeezing over anterior thigh. Good ROM with forward and lateral flexion, extension, and rotation at spine. Can extend legs passively above 60 degrees without eliciting back pain or symptoms of sciatica. FROM with internal and external rotation at hips  bilaterally. 5/5 strength with leg extension bilaterally. 4+/5 hip abduction on R, 5/5 on L. 5/5 strength with dorsi and plantar flexion bilaterally.  Neurological: 3+ patellar reflex bilaterally, 1+ Achilles reflex bilaterally.  Skin: Skin is warm and dry. No rash noted. No erythema.  Psychiatric: Normal mood and affect.  Vitals reviewed  Assessment/Plan:  Right sacroiliac joint was injected without difficulty by Dr. Oneida Alar.  Consent obtained and verified. Time-out conducted. Noted no overlying erythema, induration, or other signs of local infection. Skin prepped in a sterile fashion. Topical analgesic spray: Ethyl chloride. Joint: right SI Needle: 25g 1.5 inch Completed without difficulty. Meds: 3cc 1% xylocaine, 1cc (40mg ) depomedrol  Advised to call if fevers/chills, erythema, induration, drainage, or persistent bleeding.  Sacroiliac joint dysfunction - Suspect patient's ongoing nerve type pain is 2/2 sciatic nerve irritation at the SI joint. - Injected R SI joint as above. - Counseled patient on exercises (standing rotations, pretzel stretches, crunches, hip raises) - Hopeful that patient can reduce medication burden, as do not think ongoing symptoms are arising from spine  Follow-up 6 weeks.  Olene Floss, MD [place], PGY-2  I observed and examined the patient with the resident and agree with assessment and plan.  Note reviewed and modified by me. Stefanie Libel, MD

## 2017-01-10 ENCOUNTER — Other Ambulatory Visit: Payer: Self-pay | Admitting: Physician Assistant

## 2017-01-10 DIAGNOSIS — M7661 Achilles tendinitis, right leg: Secondary | ICD-10-CM

## 2017-01-10 DIAGNOSIS — M7662 Achilles tendinitis, left leg: Principal | ICD-10-CM

## 2017-01-10 DIAGNOSIS — M533 Sacrococcygeal disorders, not elsewhere classified: Secondary | ICD-10-CM

## 2017-01-10 MED ORDER — HYDROCODONE-ACETAMINOPHEN 10-325 MG PO TABS: 1.0000 | ORAL_TABLET | ORAL | 0 refills | Status: DC | PRN

## 2017-01-10 NOTE — Telephone Encounter (Signed)
[facility] refilled. [place] Substance abuse registry checked and no flags noted.

## 2017-01-15 ENCOUNTER — Ambulatory Visit
Admission: RE | Admit: 2017-01-15 | Discharge: 2017-01-15 | Disposition: A | Discharge status: Home / Self Care | Payer: Medicare HMO | Source: Ambulatory Visit | Attending: Otolaryngology | Admitting: Otolaryngology

## 2017-01-15 DIAGNOSIS — H9201 Otalgia, right ear: Secondary | ICD-10-CM

## 2017-01-15 DIAGNOSIS — R51 Headache: Secondary | ICD-10-CM | POA: Diagnosis not present

## 2017-01-15 MED ORDER — GADOBENATE DIMEGLUMINE 529 MG/ML IV SOLN
15.0000 mL | Freq: Once | INTRAVENOUS | Status: AC | PRN
  Administered 2017-01-15: 12 mL via INTRAVENOUS

## 2017-01-16 DIAGNOSIS — Z85828 Personal history of other malignant neoplasm of skin: Secondary | ICD-10-CM | POA: Diagnosis not present

## 2017-01-20 DIAGNOSIS — Z85828 Personal history of other malignant neoplasm of skin: Secondary | ICD-10-CM | POA: Diagnosis not present

## 2017-01-20 DIAGNOSIS — L905 Scar conditions and fibrosis of skin: Secondary | ICD-10-CM | POA: Diagnosis not present

## 2017-01-21 DIAGNOSIS — Z85828 Personal history of other malignant neoplasm of skin: Secondary | ICD-10-CM | POA: Diagnosis not present

## 2017-01-21 DIAGNOSIS — D225 Melanocytic nevi of trunk: Secondary | ICD-10-CM | POA: Diagnosis not present

## 2017-01-21 DIAGNOSIS — D2272 Melanocytic nevi of left lower limb, including hip: Secondary | ICD-10-CM | POA: Diagnosis not present

## 2017-01-21 DIAGNOSIS — D2261 Melanocytic nevi of right upper limb, including shoulder: Secondary | ICD-10-CM | POA: Diagnosis not present

## 2017-01-27 DIAGNOSIS — F4312 Post-traumatic stress disorder, chronic: Secondary | ICD-10-CM | POA: Diagnosis not present

## 2017-01-27 DIAGNOSIS — F5081 Binge eating disorder: Secondary | ICD-10-CM | POA: Diagnosis not present

## 2017-01-27 DIAGNOSIS — F330 Major depressive disorder, recurrent, mild: Secondary | ICD-10-CM | POA: Diagnosis not present

## 2017-01-27 DIAGNOSIS — F411 Generalized anxiety disorder: Secondary | ICD-10-CM | POA: Diagnosis not present

## 2017-01-27 DIAGNOSIS — R69 Illness, unspecified: Secondary | ICD-10-CM | POA: Diagnosis not present

## 2017-01-28 NOTE — Addendum Note (Signed)
Addended by: Cyd Silence on: 01/28/2017 09:27 AM   Modules accepted: Orders

## 2017-03-11 ENCOUNTER — Other Ambulatory Visit: Payer: Medicare HMO | Admitting: Sports Medicine

## 2017-03-13 ENCOUNTER — Other Ambulatory Visit: Payer: Self-pay | Admitting: Physician Assistant

## 2017-03-13 DIAGNOSIS — M533 Sacrococcygeal disorders, not elsewhere classified: Secondary | ICD-10-CM

## 2017-03-13 DIAGNOSIS — M7662 Achilles tendinitis, left leg: Principal | ICD-10-CM

## 2017-03-13 DIAGNOSIS — M7661 Achilles tendinitis, right leg: Secondary | ICD-10-CM

## 2017-03-14 MED ORDER — HYDROCODONE-ACETAMINOPHEN 10-325 MG PO TABS: 1.0000 | ORAL_TABLET | ORAL | 0 refills | Status: DC | PRN

## 2017-03-14 NOTE — Telephone Encounter (Signed)
Rx refilled and available for pick up. NCSR reviewed and no red flags

## 2017-03-14 NOTE — Telephone Encounter (Signed)
LM that prescription is placed up front ready for pick up.  Thanks,  -Ezekiel Menzer 

## 2017-04-03 ENCOUNTER — Ambulatory Visit: Payer: Medicare Other | Admitting: Physician Assistant

## 2017-04-07 ENCOUNTER — Ambulatory Visit: Payer: Self-pay | Admitting: Physician Assistant

## 2017-04-11 ENCOUNTER — Other Ambulatory Visit: Payer: Self-pay | Admitting: Physician Assistant

## 2017-04-11 ENCOUNTER — Ambulatory Visit (INDEPENDENT_AMBULATORY_CARE_PROVIDER_SITE_OTHER): Payer: Medicare HMO | Admitting: Physician Assistant

## 2017-04-11 ENCOUNTER — Encounter: Payer: Self-pay | Admitting: Physician Assistant

## 2017-04-11 VITALS — BP 112/64 | HR 89 | Resp 16 | Wt 150.0 lb

## 2017-04-11 DIAGNOSIS — Z6825 Body mass index (BMI) 25.0-25.9, adult: Secondary | ICD-10-CM

## 2017-04-11 DIAGNOSIS — F331 Major depressive disorder, recurrent, moderate: Secondary | ICD-10-CM | POA: Diagnosis not present

## 2017-04-11 DIAGNOSIS — F419 Anxiety disorder, unspecified: Secondary | ICD-10-CM | POA: Diagnosis not present

## 2017-04-11 DIAGNOSIS — R946 Abnormal results of thyroid function studies: Secondary | ICD-10-CM

## 2017-04-11 DIAGNOSIS — D649 Anemia, unspecified: Secondary | ICD-10-CM | POA: Diagnosis not present

## 2017-04-11 DIAGNOSIS — R69 Illness, unspecified: Secondary | ICD-10-CM | POA: Diagnosis not present

## 2017-04-11 DIAGNOSIS — E7800 Pure hypercholesterolemia, unspecified: Secondary | ICD-10-CM

## 2017-04-11 DIAGNOSIS — R7989 Other specified abnormal findings of blood chemistry: Secondary | ICD-10-CM

## 2017-04-11 NOTE — Patient Instructions (Signed)
Anemia, Nonspecific Anemia is a condition in which the concentration of red blood cells or hemoglobin in the blood is below normal. Hemoglobin is a substance in red blood cells that carries oxygen to the tissues of the body. Anemia results in not enough oxygen reaching these tissues. What are the causes? Common causes of anemia include:  Excessive bleeding. Bleeding may be internal or external. This includes excessive bleeding from periods (in women) or from the intestine.  Poor nutrition.  Chronic kidney, thyroid, and liver disease.  Bone marrow disorders that decrease red blood cell production.  Cancer and treatments for cancer.  HIV, AIDS, and their treatments.  Spleen problems that increase red blood cell destruction.  Blood disorders.  Excess destruction of red blood cells due to infection, medicines, and autoimmune disorders. What are the signs or symptoms?  Minor weakness.  Dizziness.  Headache.  Palpitations.  Shortness of breath, especially with exercise.  Paleness.  Cold sensitivity.  Indigestion.  Nausea.  Difficulty sleeping.  Difficulty concentrating. Symptoms may occur suddenly or they may develop slowly. How is this diagnosed? Additional blood tests are often needed. These help your health care provider determine the best treatment. Your health care provider will check your stool for blood and look for other causes of blood loss. How is this treated? Treatment varies depending on the cause of the anemia. Treatment can include:  Supplements of iron, vitamin B12, or folic acid.  Hormone medicines.  A blood transfusion. This may be needed if blood loss is severe.  Hospitalization. This may be needed if there is significant continual blood loss.  Dietary changes.  Spleen removal. Follow these instructions at home: Keep all follow-up appointments. It often takes many weeks to correct anemia, and having your health care provider check on your  condition and your response to treatment is very important. Get help right away if:  You develop extreme weakness, shortness of breath, or chest pain.  You become dizzy or have trouble concentrating.  You develop heavy vaginal bleeding.  You develop a rash.  You have bloody or black, tarry stools.  You faint.  You vomit up blood.  You vomit repeatedly.  You have abdominal pain.  You have a fever or persistent symptoms for more than 2-3 days.  You have a fever and your symptoms suddenly get worse.  You are dehydrated. This information is not intended to replace advice given to you by your health care provider. Make sure you discuss any questions you have with your health care provider. Document Released: 10/17/2004 Document Revised: 02/21/2016 Document Reviewed: 03/05/2013 Elsevier Interactive Patient Education  2017 Elsevier Inc[employer]  

## 2017-04-11 NOTE — Progress Notes (Signed)
Patient: Krystal Simpson Female    DOB: 1961-02-06   56 y.o.   MRN: [id number] Visit Date: 04/11/2017  Today's Provider: Mar Daring, PA-C   Chief Complaint  Patient presents with  . Depression  . Anemia  . Anxiety   Subjective:    Depression         Associated symptoms include decreased concentration, fatigue and appetite change (Pt reports Binge eating again).  Associated symptoms include no headaches (Migraines are becoming more frequent) and no suicidal ideas.  Past medical history includes anxiety.   Anemia  Presents for follow-up visit. There has been no bruising/bleeding easily, confusion, fever or light-headedness.  Anxiety  Presents for follow-up visit. Symptoms include decreased concentration, depressed mood (Some depression;but under good control.), excessive worry, nervous/anxious behavior and panic. Patient reports no confusion, dizziness or suicidal ideas. The quality of sleep is good (Trazodone helps).   Her past medical history is significant for anemia.  Hyperlipidemia  This is a chronic problem. The problem is controlled. Recent lipid tests were reviewed and are normal. There are no compliance problems.        Allergies  Allergen Reactions  . Ciprofloxacin     Other reaction(s): Joint Pains  . Tetanus Toxoids Hives     Current Outpatient Prescriptions:  .  Cholecalciferol 1000 UNITS capsule, Take 1,000 Units by mouth 2 (two) times daily. , Disp: , Rfl:  .  CYANOCOBALAMIN PO, Take 2 tablets by mouth daily. , Disp: , Rfl:  .  diclofenac sodium (VOLTAREN) 1 % GEL, Apply 2 g topically 4 (four) times daily., Disp: 100 g, Rfl: 5 .  DULoxetine (CYMBALTA) 30 MG capsule, Take 30 mg by mouth daily., Disp: , Rfl:  .  DULoxetine (CYMBALTA) 60 MG capsule, Take 60 mg by mouth daily. , Disp: , Rfl:  .  Flaxseed, Linseed, (FLAX SEEDS PO), Take 1 capsule by mouth daily., Disp: , Rfl:  .  gabapentin (NEURONTIN) 300 MG capsule, 1 po qHS x 7 days, then 2 po  qHS, Disp: , Rfl:  .  HYDROcodone-acetaminophen (NORCO) 10-325 MG tablet, Take 1 tablet by mouth every 4 (four) hours as needed., Disp: 180 tablet, Rfl: 0 .  lamoTRIgine (LAMICTAL) 25 MG tablet, Take 1 tablet (25 mg total) by mouth 2 (two) times daily., Disp: 60 tablet, Rfl: 0 .  meloxicam (MOBIC) 15 MG tablet, Take 15 mg by mouth daily., Disp: , Rfl:  .  MULTIPLE VITAMINS-MINERALS PO, Take 1 tablet by mouth daily. , Disp: , Rfl:  .  Omega-3 Fatty Acids (FISH OIL) 1200 MG CAPS, Take 2 capsules by mouth 2 (two) times daily. , Disp: , Rfl:  .  promethazine (PHENERGAN) 25 MG tablet, TAKE 1 TABLET (25 MG TOTAL) BY MOUTH EVERY 6 (SIX) HOURS AS NEEDED FOR NAUSEA., Disp: , Rfl: 0 .  sertraline (ZOLOFT) 100 MG tablet, Take 100 mg by mouth 2 (two) times daily. , Disp: , Rfl:  .  SUMAtriptan (IMITREX) 100 MG tablet, TAKE 1 AT HEADACHE ONSET, MAY REPEAT AFTER 2 HOURS, NO MORE THAN 2 IN A DAY,NO MORE THAN 4 IN A WEEK, Disp: , Rfl:  .  Tetrahydrozoline HCl (EYE DROPS OP), Apply 1 drop to eye 2 (two) times daily. , Disp: , Rfl:  .  topiramate (TOPAMAX) 25 MG tablet, Take 25 mg by mouth daily. 1 tab;et in the morning, and 4 tablets at bedtime, Disp: , Rfl:  .  traZODone (DESYREL) 50 MG tablet, 50  mg at bedtime as needed. Reported on 10/04/2015, Disp: , Rfl:  .  triamcinolone (NASACORT) 55 MCG/ACT AERO nasal inhaler, Place 2 sprays into the nose daily., Disp: 1 Inhaler, Rfl: 12 .  VYVANSE 40 MG capsule, Take 40 mg by mouth daily., Disp: , Rfl: 0 .  clonazePAM (KLONOPIN) 0.25 MG disintegrating tablet, , Disp: , Rfl: 1  Review of Systems  Constitutional: Positive for activity change, appetite change (Pt reports Binge eating again) and fatigue. Negative for chills, diaphoresis, fever and unexpected weight change.  Respiratory: Negative.   Cardiovascular: Negative.   Gastrointestinal: Negative.   Musculoskeletal: Negative.   Neurological: Negative for dizziness, light-headedness and headaches (Migraines are  becoming more frequent).  Hematological: Does not bruise/bleed easily.  Psychiatric/Behavioral: Positive for decreased concentration and depression. Negative for agitation, behavioral problems, confusion, dysphoric mood, hallucinations, self-injury, sleep disturbance and suicidal ideas. The patient is nervous/anxious. The patient is not hyperactive.     Social History  Substance Use Topics  . Smoking status: Never Smoker  . Smokeless tobacco: Never Used  . Alcohol use No   Objective:   BP 112/64 (BP Location: Left Arm, Patient Position: Sitting, Cuff Size: Normal)   Pulse 89   Resp 16   Wt 150 lb (68 kg)   SpO2 96%   BMI 25.75 kg/m  Vitals:   04/11/17 0917  BP: 112/64  Pulse: 89  Resp: 16  SpO2: 96%  Weight: 150 lb (68 kg)     Physical Exam  Constitutional: She appears well-developed and well-nourished. No distress.  Neck: Normal range of motion. Neck supple. No JVD present. No tracheal deviation present. No thyromegaly present.  Cardiovascular: Normal rate, regular rhythm and normal heart sounds.  Exam reveals no gallop and no friction rub.   No murmur heard. Pulmonary/Chest: Effort normal and breath sounds normal. No respiratory distress. She has no wheezes. She has no rales.  Musculoskeletal: She exhibits no edema.  Lymphadenopathy:    She has no cervical adenopathy.  Skin: She is not diaphoretic.  Psychiatric: Her speech is normal and behavior is normal. Judgment and thought content normal. Her mood appears anxious. Cognition and memory are normal. She exhibits a depressed mood. She expresses no suicidal plans and no homicidal plans.  Vitals reviewed.      Assessment & Plan:     1. Hypercholesterolemia without hypertriglyceridemia Previously stable and working on lifestyle modifications. Will check labs as below and f/u pending results. - Lipid Profile  2. Anemia, unspecified type H/O this. Will check labs as below and f/u pending results. - CBC  w/Diff/Platelet  3. Moderate episode of recurrent major depressive disorder (HCC) Currently more active with increasing anxiety. She is currently on Duloxetine 90mg  daily, lamictal 25mg  BID, sertraline 100mg , trazodone 50mg , and vyvanse 40mg  (for binge eating). She is followed by Dr. Nicolasa Ducking and is scheduled a f/u in the next coming weeks. She does have an appt with her therapist next week. She is working on new coping mechanisms with the new issues. She is to call if symptoms worsen.  4. Anxiety See above medical treatment plan.  5. Abnormal thyroid stimulating hormone (TSH) level Stable. Will check labs as below and f/u pending results. - TSH  6. BMI 25.0-25.9,adult Discussed in detail new techniques to avoid binge eating habits. Discussed separating into portion sizes so she doesn't grab the whole bag. Discussed not buying snacks and not making excuses to buy them, like saying she is buying for when her kids come by. She  is trying to start her exercising back up again as well.        Mar Daring, PA-C  [facility]

## 2017-04-12 LAB — CBC WITH DIFFERENTIAL/PLATELET
BASOS ABS: 0 10*3/uL (ref 0.0–0.2)
Basos: 0 %
EOS (ABSOLUTE): 0.1 10*3/uL (ref 0.0–0.4)
EOS: 1 %
HEMATOCRIT: 34.8 % (ref 34.0–46.6)
Hemoglobin: 11.8 g/dL (ref 11.1–15.9)
IMMATURE GRANULOCYTES: 0 %
Immature Grans (Abs): 0 10*3/uL (ref 0.0–0.1)
Lymphocytes Absolute: 2.6 10*3/uL (ref 0.7–3.1)
Lymphs: 42 %
MCH: 30 pg (ref 26.6–33.0)
MCHC: 33.9 g/dL (ref 31.5–35.7)
MCV: 89 fL (ref 79–97)
MONOCYTES: 6 %
MONOS ABS: 0.4 10*3/uL (ref 0.1–0.9)
NEUTROS PCT: 51 %
Neutrophils Absolute: 3.2 10*3/uL (ref 1.4–7.0)
PLATELETS: 193 10*3/uL (ref 150–379)
RBC: 3.93 x10E6/uL (ref 3.77–5.28)
RDW: 13.6 % (ref 12.3–15.4)
WBC: 6.2 10*3/uL (ref 3.4–10.8)

## 2017-04-12 LAB — LIPID PANEL
CHOL/HDL RATIO: 2.7 ratio (ref 0.0–4.4)
CHOLESTEROL TOTAL: 179 mg/dL (ref 100–199)
HDL: 66 mg/dL (ref >39)
LDL Calculated: 97 mg/dL (ref 0–99)
TRIGLYCERIDES: 82 mg/dL (ref 0–149)
VLDL Cholesterol Cal: 16 mg/dL (ref 5–40)

## 2017-04-12 LAB — TSH: TSH: 1.39 u[IU]/mL (ref 0.450–4.500)

## 2017-04-16 DIAGNOSIS — F5081 Binge eating disorder: Secondary | ICD-10-CM | POA: Diagnosis not present

## 2017-04-16 DIAGNOSIS — R69 Illness, unspecified: Secondary | ICD-10-CM | POA: Diagnosis not present

## 2017-04-16 DIAGNOSIS — F411 Generalized anxiety disorder: Secondary | ICD-10-CM | POA: Diagnosis not present

## 2017-04-16 DIAGNOSIS — F4312 Post-traumatic stress disorder, chronic: Secondary | ICD-10-CM | POA: Diagnosis not present

## 2017-05-14 ENCOUNTER — Other Ambulatory Visit: Payer: Self-pay | Admitting: Physician Assistant

## 2017-05-14 DIAGNOSIS — M7661 Achilles tendinitis, right leg: Secondary | ICD-10-CM

## 2017-05-14 DIAGNOSIS — M533 Sacrococcygeal disorders, not elsewhere classified: Secondary | ICD-10-CM

## 2017-05-14 DIAGNOSIS — M7662 Achilles tendinitis, left leg: Principal | ICD-10-CM

## 2017-05-14 MED ORDER — HYDROCODONE-ACETAMINOPHEN 10-325 MG PO TABS: 1.0000 | ORAL_TABLET | ORAL | 0 refills | Status: DC | PRN

## 2017-05-14 NOTE — Telephone Encounter (Signed)
Left message that RX is placed up front ready for pick up.  Thanks,  -Joseline

## 2017-05-14 NOTE — Telephone Encounter (Signed)
Rx printed. NCCSR reviewed. 

## 2017-05-19 ENCOUNTER — Ambulatory Visit: Payer: Medicare HMO | Admitting: Dietician

## 2017-05-27 ENCOUNTER — Telehealth: Payer: Self-pay | Admitting: Physician Assistant

## 2017-05-27 NOTE — Telephone Encounter (Signed)
Spoke with patient and per patient according to the list she might need Measle,mumps,Varicella,Tdap?,Polio,Hep A,HepB,Tiphoid, [place] Encephalitis,Malaria, and Rabies. Per patient she had the chicken pox doesn't know if she really needs the Varicella Vaccine.She doesn't remember if she had the measles<or vaccine? With the Tetnus shot she reports that when she received one a while back that she developed Hives.  Please Review and advise.  FYI; Informed patient that she might need to go to the health Department for some of the vaccination but that on of Jenni's nurse is going to get in contact with her as soon as we hear something and advise her what is best.  Thanks,  -Audrionna Lampton

## 2017-05-27 NOTE — Telephone Encounter (Signed)
Pt is leaving to go to the [place] first of October and will be needing some vaccines.  She has a list of recommended vaccines.  She will also need a note stating that she take hydrocodone.  Her call back is [phone]  Con Memos

## 2017-05-28 ENCOUNTER — Encounter: Payer: Self-pay | Admitting: Physician Assistant

## 2017-05-28 NOTE — Telephone Encounter (Signed)
If patient desires we can do mmr and varicella titers to make sure immune before she leaves and see if these are also required.   We can give a few of these vaccines but not all of them. If we start any of them she will have to wait 4 weeks before she can get the others, which will most likely be when she is leaving. She will also not be able to complete series before she leaves of Hep A and Hep B, rabies, etc. They would only be started. I would recommend her call the health dept to see which vaccines they have because if they have them all it would be better to get them that way. She can also call infectious disease to see if they can see her for a preventative travel visit to see which ones she requires if she would rather do that than the health department.   I will print the note about the hydrocodone.

## 2017-05-28 NOTE — Telephone Encounter (Signed)
Patient advised. She will contact Health Dept and infectious disease if needed. She will pick up letter after lunch.

## 2017-06-02 DIAGNOSIS — R69 Illness, unspecified: Secondary | ICD-10-CM | POA: Diagnosis not present

## 2017-06-03 ENCOUNTER — Encounter: Payer: Self-pay | Admitting: Dietician

## 2017-06-03 ENCOUNTER — Encounter: Payer: Medicare HMO | Attending: Psychiatry | Admitting: Dietician

## 2017-06-03 VITALS — Ht 64.0 in | Wt 146.8 lb

## 2017-06-03 DIAGNOSIS — Z713 Dietary counseling and surveillance: Secondary | ICD-10-CM | POA: Insufficient documentation

## 2017-06-03 DIAGNOSIS — F329 Major depressive disorder, single episode, unspecified: Secondary | ICD-10-CM | POA: Diagnosis not present

## 2017-06-03 DIAGNOSIS — Z6825 Body mass index (BMI) 25.0-25.9, adult: Secondary | ICD-10-CM | POA: Insufficient documentation

## 2017-06-03 DIAGNOSIS — R635 Abnormal weight gain: Secondary | ICD-10-CM

## 2017-06-03 DIAGNOSIS — F509 Eating disorder, unspecified: Secondary | ICD-10-CM

## 2017-06-03 DIAGNOSIS — F419 Anxiety disorder, unspecified: Secondary | ICD-10-CM | POA: Insufficient documentation

## 2017-06-03 DIAGNOSIS — R69 Illness, unspecified: Secondary | ICD-10-CM | POA: Diagnosis not present

## 2017-06-03 NOTE — Progress Notes (Signed)
Medical Nutrition Therapy: Visit start time: 1430  end time: 1530 Assessment:  Diagnosis:weight gain, disordered eating Past medical history: anxiety, depression (meets regularly with a therapist), migraines Psychosocial issues/ stress concerns: rates her stress as high but indicates "ok" as to how well she is dealing with her stress. Preferred learning method:  . Auditory . Visual . Hands-on  Current weight:146.8 lbs Height: 64 in Medications, supplements:see list Progress and evaluation:  Patient in for initial medical nutrition therapy appointment. She gives history of highest weight of 216 lbs 3 years ago and she dieted and lost to 90+ lbs. She presently has not established a pattern of eating. She has mindset of good and bad foods and expresses guilt about eating anything that she considers high calorie. She states that approximately 3 days per week she binges. "I start eating around 11:00am and just continue to eat. Examples are M&M's, 12 cookies , 4 nutty buddies, etc. She denies any form of purging. She cooks very little and prefers cold foods to hot foods. She gave an example of a meal of [place], pretzels and a mini bagel or [place] sandwich and carrots. Her main beverages are water and occasional diet Pepsi. Rarely eats "out".  She states,"I just need some simple meal ideas". Physical activity: 0-4 days per week for 30 minutes to 3 hours- walking, weights, floor exercise   Nutrition Care Education:  Basic nutrition/Disorder Eating:  Discussed importance of establishing a meal pattern. Gave and reviewed "Quick and healthy Meals" to show simple meal ideas that incorporate a variety of foods. She stated, "This is great. I didn't know I could still eat these foods." Discussed goal of including foods that she likes without feeling guilty acknowledging that this is a process that involves time and therapy. Discussed need to focus on foods to include that she likes verses restriction but  to include as part of an established meal or planned snack. Also, discussed strategies verses will power.  Nutritional Diagnosis:   Disordered eating pattern related to binging, no structured pattern of eating as evidenced by diet history.  Intervention:  Establish a meal pattern of 3 meals spaced 4-5 hours apart.  Balance meals with 2-4 oz protein, 2-3 servings of carbohydrate (starch, fruit) and non-starchy vegetables. Spread 9-10 servings of carbohydrate over the day. Think of food guide plate. Cut up vegetables and fruits ahead of time to add as sides. Keep frozen vegetables that can be steamed in microwave. Refer to "Quick and Healthy Meals" hand-out for ideas for simple meals. If there are trigger foods that increase risk of binging, consider not purchasing those foods for home.  Focus on foods to add to meet nutrient needs that you enjoy eating verses foods to avoid.  Education Materials given:  . Plate Planner . Food lists/ Planning A Balanced Meal . Quick and Healthy Meals . Goals/ instructions Learner/ who was taught:  . Patient  Level of understanding: . Partial understanding; needs review/ practice Demonstrated degree of understanding via:   Teach back Learning barriers: . None Willingness to learn/ readiness for change: . Eager; dis-orderd eating will make change a challenge and a long term process.  Monitoring and Evaluation:  Dietary intake, exercise, , and body weight      follow up: 07/07/17 at 1:15pm

## 2017-06-03 NOTE — Patient Instructions (Addendum)
Establish a meal pattern of 3 meals spaced 4-5 hours apart.  Balance meals with 2-4 oz protein, 2-3 servings of carbohydrate (starch, fruit) and non-starchy vegetables. Spread 9-10 servings of carbohydrate over the day. Think of food guide plate. Cut up vegetables and fruits ahead of time to add as sides. Keep frozen vegetables that can be steamed in microwave. Refer to "Quick and Healthy Meals" hand-out for ideas for simple meals. If there are trigger foods that increase risk of binging, consider not purchasing those foods for home.  Focus on foods to add to meet nutrient needs that you enjoy eating verses foods to avoid.

## 2017-06-17 DIAGNOSIS — R69 Illness, unspecified: Secondary | ICD-10-CM | POA: Diagnosis not present

## 2017-06-17 DIAGNOSIS — F4312 Post-traumatic stress disorder, chronic: Secondary | ICD-10-CM | POA: Diagnosis not present

## 2017-06-17 DIAGNOSIS — F5081 Binge eating disorder: Secondary | ICD-10-CM | POA: Diagnosis not present

## 2017-06-17 DIAGNOSIS — F411 Generalized anxiety disorder: Secondary | ICD-10-CM | POA: Diagnosis not present

## 2017-07-07 ENCOUNTER — Ambulatory Visit: Payer: Medicare HMO | Admitting: Dietician

## 2017-07-09 DIAGNOSIS — H1132 Conjunctival hemorrhage, left eye: Secondary | ICD-10-CM | POA: Diagnosis not present

## 2017-07-18 DIAGNOSIS — J301 Allergic rhinitis due to pollen: Secondary | ICD-10-CM | POA: Diagnosis not present

## 2017-07-18 DIAGNOSIS — Z85828 Personal history of other malignant neoplasm of skin: Secondary | ICD-10-CM | POA: Diagnosis not present

## 2017-07-18 DIAGNOSIS — H6062 Unspecified chronic otitis externa, left ear: Secondary | ICD-10-CM | POA: Diagnosis not present

## 2017-07-18 DIAGNOSIS — H6123 Impacted cerumen, bilateral: Secondary | ICD-10-CM | POA: Diagnosis not present

## 2017-07-24 ENCOUNTER — Encounter: Payer: Self-pay | Admitting: Dietician

## 2017-07-30 ENCOUNTER — Other Ambulatory Visit: Payer: Self-pay | Admitting: Physician Assistant

## 2017-07-30 DIAGNOSIS — M7662 Achilles tendinitis, left leg: Principal | ICD-10-CM

## 2017-07-30 DIAGNOSIS — M533 Sacrococcygeal disorders, not elsewhere classified: Secondary | ICD-10-CM

## 2017-07-30 DIAGNOSIS — M7661 Achilles tendinitis, right leg: Secondary | ICD-10-CM

## 2017-07-31 MED ORDER — HYDROCODONE-ACETAMINOPHEN 10-325 MG PO TABS: 1.0000 | ORAL_TABLET | ORAL | 0 refills | Status: DC | PRN

## 2017-07-31 NOTE — Telephone Encounter (Signed)
NCCSR reviewed. Rx printed. 

## 2017-09-10 ENCOUNTER — Telehealth: Payer: Self-pay | Admitting: Physician Assistant

## 2017-09-10 DIAGNOSIS — F411 Generalized anxiety disorder: Secondary | ICD-10-CM | POA: Diagnosis not present

## 2017-09-10 DIAGNOSIS — F4312 Post-traumatic stress disorder, chronic: Secondary | ICD-10-CM | POA: Diagnosis not present

## 2017-09-10 DIAGNOSIS — R69 Illness, unspecified: Secondary | ICD-10-CM | POA: Diagnosis not present

## 2017-09-10 DIAGNOSIS — F5081 Binge eating disorder: Secondary | ICD-10-CM | POA: Diagnosis not present

## 2017-09-29 ENCOUNTER — Other Ambulatory Visit: Payer: Self-pay | Admitting: Physician Assistant

## 2017-09-29 DIAGNOSIS — M7662 Achilles tendinitis, left leg: Principal | ICD-10-CM

## 2017-09-29 DIAGNOSIS — M7661 Achilles tendinitis, right leg: Secondary | ICD-10-CM

## 2017-09-29 DIAGNOSIS — M533 Sacrococcygeal disorders, not elsewhere classified: Secondary | ICD-10-CM

## 2017-09-30 MED ORDER — HYDROCODONE-ACETAMINOPHEN 10-325 MG PO TABS: 1.0000 | ORAL_TABLET | ORAL | 0 refills | Status: DC | PRN

## 2017-09-30 NOTE — Telephone Encounter (Signed)
LM that prescription os placed up front ready for pick up.  Thanks,  -Dontai Pember

## 2017-09-30 NOTE — Telephone Encounter (Signed)
NCCSR reviewed. Rx printed. 

## 2017-10-02 NOTE — Telephone Encounter (Signed)
scheduled

## 2017-10-03 ENCOUNTER — Ambulatory Visit: Payer: Medicare HMO

## 2017-10-06 ENCOUNTER — Encounter: Payer: Medicare HMO | Admitting: Physician Assistant

## 2017-10-06 ENCOUNTER — Ambulatory Visit (INDEPENDENT_AMBULATORY_CARE_PROVIDER_SITE_OTHER): Payer: Medicare HMO

## 2017-10-06 VITALS — BP 118/86 | HR 100 | Temp 98.7°F | Ht 64.0 in | Wt 160.2 lb

## 2017-10-06 DIAGNOSIS — Z0000 Encounter for general adult medical examination without abnormal findings: Secondary | ICD-10-CM | POA: Diagnosis not present

## 2017-10-06 NOTE — Patient Instructions (Signed)
Krystal Simpson , Thank you for taking time to come for your Medicare Wellness Visit. I appreciate your ongoing commitment to your health goals. Please review the following plan we discussed and let me know if I can assist you in the future.   Screening recommendations/referrals: Colonoscopy: Up to date Mammogram: Up to date Bone Density: N/A Recommended yearly ophthalmology/optometry visit for glaucoma screening and checkup Recommended yearly dental visit for hygiene and checkup  Vaccinations: Influenza vaccine: Up to date Pneumococcal vaccine: N/A Tdap vaccine: N/A Shingles vaccine: N/A    Advanced directives: Advance directive discussed with you today. I have provided a copy for you to complete at home and have notarized. Once this is complete please bring a copy in to our office so we can scan it into your chart.  Conditions/risks identified: Pt to increase the amount of exercise done. Pt to start walking for 3-4 days a week for at least 30 minutes at a time.   Next appointment: 10/09/17 @ 10:00 AM  Preventive Care 40-64 Years, Female Preventive care refers to lifestyle choices and visits with your health care provider that can promote health and wellness. What does preventive care include?  A yearly physical exam. This is also called an annual well check.  Dental exams once or twice a year.  Routine eye exams. Ask your health care provider how often you should have your eyes checked.  Personal lifestyle choices, including:  Daily care of your teeth and gums.  Regular physical activity.  Eating a healthy diet.  Avoiding tobacco and drug use.  Limiting alcohol use.  Practicing safe sex.  Taking low-dose aspirin daily starting at age 50.  Taking vitamin and mineral supplements as recommended by your health care provider. What happens during an annual well check? The services and screenings done by your health care provider during your annual well check will depend on  your age, overall health, lifestyle risk factors, and family history of disease. Counseling  Your health care provider may ask you questions about your:  Alcohol use.  Tobacco use.  Drug use.  Emotional well-being.  Home and relationship well-being.  Sexual activity.  Eating habits.  Work and work Statistician.  Method of birth control.  Menstrual cycle.  Pregnancy history. Screening  You may have the following tests or measurements:  Height, weight, and BMI.  Blood pressure.  Lipid and cholesterol levels. These may be checked every 5 years, or more frequently if you are over 38 years old.  Skin check.  Lung cancer screening. You may have this screening every year starting at age 68 if you have a 30-pack-year history of smoking and currently smoke or have quit within the past 15 years.  Fecal occult blood test (FOBT) of the stool. You may have this test every year starting at age 28.  Flexible sigmoidoscopy or colonoscopy. You may have a sigmoidoscopy every 5 years or a colonoscopy every 10 years starting at age 46.  Hepatitis C blood test.  Hepatitis B blood test.  Sexually transmitted disease (STD) testing.  Diabetes screening. This is done by checking your blood sugar (glucose) after you have not eaten for a while (fasting). You may have this done every 1-3 years.  Mammogram. This may be done every 1-2 years. Talk to your health care provider about when you should start having regular mammograms. This may depend on whether you have a family history of breast cancer.  BRCA-related cancer screening. This may be done if you have a  family history of breast, ovarian, tubal, or peritoneal cancers.  Pelvic exam and Pap test. This may be done every 3 years starting at age 41. Starting at age 32, this may be done every 5 years if you have a Pap test in combination with an HPV test.  Bone density scan. This is done to screen for osteoporosis. You may have this scan if  you are at high risk for osteoporosis. Discuss your test results, treatment options, and if necessary, the need for more tests with your health care provider. Vaccines  Your health care provider may recommend certain vaccines, such as:  Influenza vaccine. This is recommended every year.  Tetanus, diphtheria, and acellular pertussis (Tdap, Td) vaccine. You may need a Td booster every 10 years.  Zoster vaccine. You may need this after age 81.  Pneumococcal 13-valent conjugate (PCV13) vaccine. You may need this if you have certain conditions and were not previously vaccinated.  Pneumococcal polysaccharide (PPSV23) vaccine. You may need one or two doses if you smoke cigarettes or if you have certain conditions. Talk to your health care provider about which screenings and vaccines you need and how often you need them. This information is not intended to replace advice given to you by your health care provider. Make sure you discuss any questions you have with your health care provider. Document Released: 10/06/2015 Document Revised: 05/29/2016 Document Reviewed: 07/11/2015 Elsevier Interactive Patient Education  2017 [place] Prevention in the Home Falls can cause injuries. They can happen to people of all ages. There are many things you can do to make your home safe and to help prevent falls. What can I do on the outside of my home?  Regularly fix the edges of walkways and driveways and fix any cracks.  Remove anything that might make you trip as you walk through a door, such as a raised step or threshold.  Trim any bushes or trees on the path to your home.  Use bright outdoor lighting.  Clear any walking paths of anything that might make someone trip, such as rocks or tools.  Regularly check to see if handrails are loose or broken. Make sure that both sides of any steps have handrails.  Any raised decks and porches should have guardrails on the edges.  Have any  leaves, snow, or ice cleared regularly.  Use sand or salt on walking paths during winter.  Clean up any spills in your garage right away. This includes oil or grease spills. What can I do in the bathroom?  Use night lights.  Install grab bars by the toilet and in the tub and shower. Do not use towel bars as grab bars.  Use non-skid mats or decals in the tub or shower.  If you need to sit down in the shower, use a plastic, non-slip stool.  Keep the floor dry. Clean up any water that spills on the floor as soon as it happens.  Remove soap buildup in the tub or shower regularly.  Attach bath mats securely with double-sided non-slip rug tape.  Do not have throw rugs and other things on the floor that can make you trip. What can I do in the bedroom?  Use night lights.  Make sure that you have a light by your bed that is easy to reach.  Do not use any sheets or blankets that are too big for your bed. They should not hang down onto the floor.  Have a firm  chair that has side arms. You can use this for support while you get dressed.  Do not have throw rugs and other things on the floor that can make you trip. What can I do in the kitchen?  Clean up any spills right away.  Avoid walking on wet floors.  Keep items that you use a lot in easy-to-reach places.  If you need to reach something above you, use a strong step stool that has a grab bar.  Keep electrical cords out of the way.  Do not use floor polish or wax that makes floors slippery. If you must use wax, use non-skid floor wax.  Do not have throw rugs and other things on the floor that can make you trip. What can I do with my stairs?  Do not leave any items on the stairs.  Make sure that there are handrails on both sides of the stairs and use them. Fix handrails that are broken or loose. Make sure that handrails are as long as the stairways.  Check any carpeting to make sure that it is firmly attached to the stairs.  Fix any carpet that is loose or worn.  Avoid having throw rugs at the top or bottom of the stairs. If you do have throw rugs, attach them to the floor with carpet tape.  Make sure that you have a light switch at the top of the stairs and the bottom of the stairs. If you do not have them, ask someone to add them for you. What else can I do to help prevent falls?  Wear shoes that:  Do not have high heels.  Have rubber bottoms.  Are comfortable and fit you well.  Are closed at the toe. Do not wear sandals.  If you use a stepladder:  Make sure that it is fully opened. Do not climb a closed stepladder.  Make sure that both sides of the stepladder are locked into place.  Ask someone to hold it for you, if possible.  Clearly mark and make sure that you can see:  Any grab bars or handrails.  First and last steps.  Where the edge of each step is.  Use tools that help you move around (mobility aids) if they are needed. These include:  Canes.  Walkers.  Scooters.  Crutches.  Turn on the lights when you go into a dark area. Replace any light bulbs as soon as they burn out.  Set up your furniture so you have a clear path. Avoid moving your furniture around.  If any of your floors are uneven, fix them.  If there are any pets around you, be aware of where they are.  Review your medicines with your doctor. Some medicines can make you feel dizzy. This can increase your chance of falling. Ask your doctor what other things that you can do to help prevent falls. This information is not intended to replace advice given to you by your health care provider. Make sure you discuss any questions you have with your health care provider. Document Released: 07/06/2009 Document Revised: 02/15/2016 Document Reviewed: 10/14/2014 Elsevier Interactive Patient Education  2017 [employer].

## 2017-10-06 NOTE — Progress Notes (Signed)
Subjective:   Krystal Simpson is a 57 y.o. female who presents for Medicare Annual (Subsequent) preventive examination.  Review of Systems:  N/A  Cardiac Risk Factors include: diabetes mellitus      Objective:     Vitals: BP 118/86 (BP Location: Left Arm)   Pulse 100   Temp 98.7 F (37.1 C) (Oral)   Ht 5\' 4"  (1.626 m)   Wt 160 lb 3.2 oz (72.7 kg)   BMI 27.50 kg/m   Body mass index is 27.5 kg/m.  Advanced Directives 10/06/2017 06/03/2017 10/04/2016 06/28/2016 11/14/2015 11/08/2015 06/14/2015  Does Patient Have a Medical Advance Directive? No No No No No No No  Does patient want to make changes to medical advance directive? - - - No - Patient declined - - -  Copy of [place] in Chart? - - - No - copy requested - - -  Would patient like information on creating a medical advance directive? Yes (MAU/Ambulatory/Procedural Areas - Information given) No - Patient declined - Yes - Educational materials given - No - patient declined information -    Tobacco Social History   Tobacco Use  Smoking Status Never Smoker  Smokeless Tobacco Never Used     Counseling given: Not Answered   Clinical Intake:  Pre-visit preparation completed: Yes  Pain : 0-10 Pain Score: 5  Pain Type: Chronic pain Pain Location: Back Pain Orientation: Other (Comment)(all over) Pain Descriptors / Indicators: Dull Pain Frequency: Constant     Nutritional Status: BMI 25 -29 Overweight Nutritional Risks: None Diabetes: No  How often do you need to have someone help you when you read instructions, pamphlets, or other written materials from your doctor or pharmacy?: 1 - Never  Interpreter Needed?: No  Information entered by :: [facility], LPN  Past Medical History:  Diagnosis Date  . Abnormal Pap smear of cervix   . Anxiety   . 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 Depression   . History of kidney stones    Past Surgical History:    Procedure Laterality Date  . BASAL CELL CARCINOMA EXCISION  2014  . BASAL CELL CARCINOMA EXCISION  09/20/2015   Dr. Evorn Gong, left mid forearm  . MOHS SURGERY  2015   top of head Dr. Shelda Jakes at [facility]  . SQUAMOUS CELL CARCINOMA EXCISION  2014   Family History  Problem Relation Age of Onset  . Emphysema Mother   . Lung cancer Father   . Healthy Sister   . Heart attack Sister 35  . Heart attack Maternal Grandmother   . Breast cancer Sister    Social History   Socioeconomic History  . Marital status: Married    Spouse name: Abbe Amsterdam  . Number of children: 2  . Years of education: some college  . Highest education level: Some college, no degree  Social Needs  . Financial resource strain: Not hard at all  . Food insecurity - worry: Never true  . Food insecurity - inability: Never true  . Transportation needs - medical: No  . Transportation needs - non-medical: No  Occupational History  . Occupation: disabled  Tobacco Use  . Smoking status: Never Smoker  . Smokeless tobacco: Never Used  Substance and Sexual Activity  . Alcohol use: No    Alcohol/week: 0.0 oz  . Drug use: No  . Sexual activity: Not Currently  Other Topics Concern  . 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     Outpatient Encounter Medications as of 10/06/2017  Medication Sig  . azelastine (ASTELIN) 0.1 % nasal spray Place 2 sprays into both nostrils 2 (two) times daily. Use in each nostril as directed  . Cholecalciferol 1000 UNITS capsule Take 1,000 Units by mouth daily.   . clonazePAM (KLONOPIN) 0.25 MG disintegrating tablet Take 0.25 mg by mouth daily.   . CYANOCOBALAMIN PO Take 1 tablet by mouth daily.   . DULoxetine (CYMBALTA) 30 MG capsule Take 30 mg by mouth daily.  . DULoxetine (CYMBALTA) 60 MG capsule Take 60 mg by mouth daily.   . Flaxseed, Linseed, (FLAX SEEDS PO) Take 1 capsule by mouth daily.  Marland Kitchen gabapentin (NEURONTIN) 300 MG capsule 3 times daily  .  HYDROcodone-acetaminophen (NORCO) 10-325 MG tablet Take 1 tablet by mouth every 4 (four) hours as needed.  . lamoTRIgine (LAMICTAL) 25 MG tablet Take 150 mg by mouth daily.   . MULTIPLE VITAMINS-MINERALS PO Take 1 tablet by mouth daily.   . Omega-3 Fatty Acids (FISH OIL) 1200 MG CAPS Take 1 capsule by mouth daily.   . polyethylene glycol powder (MIRALAX) powder Take by mouth daily.   . promethazine (PHENERGAN) 25 MG tablet TAKE 1 TABLET (25 MG TOTAL) BY MOUTH EVERY 6 (SIX) HOURS AS NEEDED FOR NAUSEA.  Marland Kitchen sertraline (ZOLOFT) 100 MG tablet Take 100 mg by mouth 2 (two) times daily.   . SUMAtriptan (IMITREX) 100 MG tablet TAKE 1 AT HEADACHE ONSET, MAY REPEAT AFTER 2 HOURS, NO MORE THAN 2 IN A DAY,NO MORE THAN 4 IN A WEEK  . Tetrahydrozoline HCl (EYE DROPS OP) Apply 1 drop to eye 2 (two) times daily.   Marland Kitchen topiramate (TOPAMAX) 25 MG tablet Take 25 mg by mouth daily. 1 tab;et in the morning, and 4 tablets at bedtime  . VYVANSE 40 MG capsule Take 50 mg by mouth every morning.   . [DISCONTINUED] diclofenac sodium (VOLTAREN) 1 % GEL Apply 2 g topically 4 (four) times daily.  . [DISCONTINUED] meloxicam (MOBIC) 15 MG tablet Take 15 mg by mouth daily.  . [DISCONTINUED] traZODone (DESYREL) 50 MG tablet 50 mg at bedtime as needed. Reported on 10/04/2015  . [DISCONTINUED] triamcinolone (NASACORT) 55 MCG/ACT AERO nasal inhaler Place 2 sprays into the nose daily.   No facility-administered encounter medications on file as of 10/06/2017.     Activities of Daily Living In your present state of health, do you have any difficulty performing the following activities: 10/06/2017  Hearing? N  Vision? N  Difficulty concentrating or making decisions? N  Walking or climbing stairs? N  Dressing or bathing? N  Doing errands, shopping? N  Preparing Food and eating ? N  Using the Toilet? N  In the past six months, have you accidently leaked urine? N  Do you have problems with loss of bowel control? N  Managing your  Medications? N  Managing your Finances? N  Housekeeping or managing your Housekeeping? N  Some recent data might be hidden    Patient Care Team: Mar Daring, PA-C as PCP - General (Family Medicine) Arelia Sneddon, [place] as Consulting Physician (Optometry) Manley Mason Donivan Scull, MD as Referring Physician (Dermatology) Oneta Rack, MD as Consulting Physician (Dermatology) Chauncey Mann, MD as Referring Physician (Psychiatry) Ruthy Dick, LCSW as Social Worker Sales promotion account executive)    Assessment:   This is a routine wellness examination for Shon.  Exercise Activities and Dietary recommendations Current Exercise Habits:  Home exercise routine, Type of exercise: walking, Time (Minutes): 35, Frequency (Times/Week): 2, Weekly Exercise (Minutes/Week): 70, Intensity: Mild, Exercise limited by: orthopedic condition(s)  Goals    . Exercise 150 minutes per week (moderate activity)     10/06/17- Pt to increase the amount of exercise done. Pt to start walking for 3-4 days a week for at least 30 minutes at a time.        Fall Risk Fall Risk  10/06/2017 06/03/2017 12/26/2016 10/04/2016 11/08/2015  Falls in the past year? Yes Yes No Yes No  Number falls in past yr: 1 2 or more - 2 or more -  Comment - walking dog; tripped on roots - - -  Injury with Fall? No No - No -  Risk Factor Category  - - - High Fall Risk -  Risk for fall due to : - - Other (Comment) - Other (Comment)  Follow up Falls prevention discussed - - - -   Is the patient's home free of loose throw rugs in walkways, pet beds, electrical cords, etc? No      Grab bars in the bathroom? no      Handrails on the stairs?  N/A      Adequate lighting?   yes  Timed Get Up and Go performed: N/A  Depression Screen PHQ 2/9 Scores 10/06/2017 12/26/2016 10/04/2016 11/08/2015  PHQ - 2 Score 6 0 2 0  PHQ- 9 Score 18 - 13 -  Exception Documentation - Other- indicate reason in comment box - Other- indicate reason in comment  box     Cognitive Function     6CIT Screen 10/06/2017  What Year? 0 points  What month? 0 points  What time? 0 points  Count back from 20 0 points  Months in reverse 0 points  Repeat phrase 2 points  Total Score 2    Immunization History  Administered Date(s) Administered  . Influenza, Seasonal, Injecte, Preservative Fre 06/24/2015  . Influenza,inj,Quad PF,6+ Mos 10/04/2016    Qualifies for Shingles Vaccine? N/A  Screening Tests Health Maintenance  Topic Date Due  . HIV Screening  06/30/1975  . MAMMOGRAM  11/05/2018  . PAP SMEAR  10/05/2019  . COLONOSCOPY  08/17/2023  . TETANUS/TDAP  10/04/2026  . INFLUENZA VACCINE  Completed  . Hepatitis C Screening  Completed    Cancer Screenings: Lung: Low Dose CT Chest recommended if Age 14-80 years, 30 pack-year currently smoking OR have quit w/in 15years. Patient does not qualify. Breast:  Up to date on Mammogram? Yes   Up to date of Bone Density/Dexa? N/A Colorectal: Up to date  Additional Screenings:  Hepatitis B/HIV/Syphillis: Pt declines today.  Hepatitis C Screening: Up to date     Plan:  I have personally reviewed and addressed the Medicare Annual Wellness questionnaire and have noted the following in the patient's chart:  A. Medical and social history B. Use of alcohol, tobacco or illicit drugs  C. Current medications and supplements D. Functional ability and status E.  Nutritional status F.  Physical activity G. Advance directives H. List of other physicians I.  Hospitalizations, surgeries, and ER visits in previous 12 months J.  [place] such as hearing and vision if needed, cognitive and depression L. Referrals and appointments - none  In addition, I have reviewed and discussed with patient certain preventive protocols, quality metrics, and best practice recommendations. A written personalized care plan for preventive services as well as general preventive health recommendations were provided  to  patient.  See attached scanned questionnaire for additional information.   Signed,  Fabio Neighbors, LPN Nurse Health Advisor   Nurse Recommendations: Pt needs a follow up on her low depression screen score and chronic back pain.

## 2017-10-08 ENCOUNTER — Ambulatory Visit: Payer: Medicare HMO

## 2017-10-08 DIAGNOSIS — F411 Generalized anxiety disorder: Secondary | ICD-10-CM | POA: Diagnosis not present

## 2017-10-08 DIAGNOSIS — F5081 Binge eating disorder: Secondary | ICD-10-CM | POA: Diagnosis not present

## 2017-10-08 DIAGNOSIS — F4312 Post-traumatic stress disorder, chronic: Secondary | ICD-10-CM | POA: Diagnosis not present

## 2017-10-08 DIAGNOSIS — R69 Illness, unspecified: Secondary | ICD-10-CM | POA: Diagnosis not present

## 2017-10-09 ENCOUNTER — Ambulatory Visit (INDEPENDENT_AMBULATORY_CARE_PROVIDER_SITE_OTHER): Payer: Medicare HMO | Admitting: Physician Assistant

## 2017-10-09 ENCOUNTER — Encounter: Payer: Self-pay | Admitting: Physician Assistant

## 2017-10-09 VITALS — BP 116/80 | HR 92 | Temp 98.4°F | Resp 16 | Ht 64.0 in | Wt 157.0 lb

## 2017-10-09 DIAGNOSIS — Z124 Encounter for screening for malignant neoplasm of cervix: Secondary | ICD-10-CM

## 2017-10-09 DIAGNOSIS — N6002 Solitary cyst of left breast: Secondary | ICD-10-CM | POA: Diagnosis not present

## 2017-10-09 DIAGNOSIS — Z0000 Encounter for general adult medical examination without abnormal findings: Secondary | ICD-10-CM

## 2017-10-09 DIAGNOSIS — Z1211 Encounter for screening for malignant neoplasm of colon: Secondary | ICD-10-CM

## 2017-10-09 DIAGNOSIS — E559 Vitamin D deficiency, unspecified: Secondary | ICD-10-CM

## 2017-10-09 DIAGNOSIS — Z1239 Encounter for other screening for malignant neoplasm of breast: Secondary | ICD-10-CM

## 2017-10-09 DIAGNOSIS — R251 Tremor, unspecified: Secondary | ICD-10-CM

## 2017-10-09 DIAGNOSIS — Z1231 Encounter for screening mammogram for malignant neoplasm of breast: Secondary | ICD-10-CM

## 2017-10-09 DIAGNOSIS — R87610 Atypical squamous cells of undetermined significance on cytologic smear of cervix (ASC-US): Secondary | ICD-10-CM | POA: Diagnosis not present

## 2017-10-09 DIAGNOSIS — R7989 Other specified abnormal findings of blood chemistry: Secondary | ICD-10-CM

## 2017-10-09 DIAGNOSIS — E538 Deficiency of other specified B group vitamins: Secondary | ICD-10-CM

## 2017-10-09 DIAGNOSIS — Z01411 Encounter for gynecological examination (general) (routine) with abnormal findings: Secondary | ICD-10-CM

## 2017-10-09 DIAGNOSIS — E7800 Pure hypercholesterolemia, unspecified: Secondary | ICD-10-CM

## 2017-10-09 DIAGNOSIS — R87619 Unspecified abnormal cytological findings in specimens from cervix uteri: Secondary | ICD-10-CM | POA: Insufficient documentation

## 2017-10-09 LAB — IFOBT (OCCULT BLOOD): IMMUNOLOGICAL FECAL OCCULT BLOOD TEST: NEGATIVE

## 2017-10-09 NOTE — Progress Notes (Signed)
Patient: Krystal Simpson, Female    DOB: 1961/05/27, 57 y.o.   MRN: [id number] Visit Date: 10/09/2017  Today's Provider: Mar Daring, PA-C   Chief Complaint  Patient presents with  . Annual Exam   Subjective:    Annual physical exam Krystal Simpson is a 57 y.o. female who presents today for health maintenance and complete physical. She feels well. She reports exercising active with daily activites. She reports she is sleeping fairly well.  10/04/16 AWE 10/04/16 Pap-abnormal, recheck in one year 11/05/16 Mammogram BI-RADS 1 08/16/13 colonoscopy-WNL -----------------------------------------------------------------   Review of Systems  Constitutional: Negative.   HENT: Negative.   Eyes: Negative.   Respiratory: Negative.   Cardiovascular: Negative.   Gastrointestinal: Negative.   Endocrine: Negative.   Genitourinary: Negative.   Musculoskeletal: Positive for back pain.  Skin: Negative.   Allergic/Immunologic: Negative.   Neurological: Negative.   Hematological: Negative.   Psychiatric/Behavioral: Negative.     Social History      She  reports that  has never smoked. she has never used smokeless tobacco. She reports that she does not drink alcohol or use drugs.       Social History   Socioeconomic History  . Marital status: Married    Spouse name: Abbe Amsterdam  . Number of children: 2  . Years of education: some college  . Highest education level: Some college, no degree  Social Needs  . Financial resource strain: Not hard at all  . Food insecurity - worry: Never true  . Food insecurity - inability: Never true  . Transportation needs - medical: No  . Transportation needs - non-medical: No  Occupational History  . Occupation: disabled  Tobacco Use  . Smoking status: Never Smoker  . Smokeless tobacco: Never Used  Substance and Sexual Activity  . Alcohol use: No    Alcohol/week: 0.0 oz  . Drug use: No  . Sexual activity: Not Currently  Other Topics  Concern  . 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     Past Medical History:  Diagnosis Date  . Abnormal Pap smear of cervix   . Anxiety   . 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 Depression   . History of kidney stones      Patient Active Problem List   Diagnosis Date Noted  . Sacroiliac joint dysfunction 01/09/2017  . BPPV (benign paroxysmal positional vertigo) 11/14/2015  . Abnormal weight loss 10/04/2015  . Back ache 08/12/2015  . Right leg pain 08/07/2015  . Sciatica of right side 07/19/2015  . Low back pain 06/27/2015  . Abnormal thyroid stimulating hormone (TSH) level 06/01/2015  . Absolute anemia 06/01/2015  . Anxiety 06/01/2015  . Basal cell carcinoma 06/01/2015  . Colon polyp 06/01/2015  . Clinical depression 06/01/2015  . Esophageal reflux 06/01/2015  . Headache, migraine 06/01/2015  . Adiposity 06/01/2015  . Hypercholesterolemia without hypertriglyceridemia 06/01/2015  . Squamous cell carcinoma 06/01/2015  . Avitaminosis D 06/01/2015  . Achilles tendonitis, bilateral 10/12/2014  . Disordered sleep 03/03/2014  . Sleep disorder 03/03/2014  . Obesity, unspecified 03/03/2014    Past Surgical History:  Procedure Laterality Date  . BASAL CELL CARCINOMA EXCISION  2014  . BASAL CELL CARCINOMA EXCISION  09/20/2015   Dr. Evorn Gong, left mid forearm  . MOHS SURGERY  2015   top of head Dr. Shelda Jakes at [facility]  . SQUAMOUS CELL  CARCINOMA EXCISION  2014    Family History        Family Status  Relation Name Status  . Mother  Deceased at age 14  . Father  Deceased at age 45  . Sister  Alive  . MGM  Deceased at age 64       MI  . Sister HALF SISTER Alive        Her family history includes Breast cancer in her sister; Emphysema in her mother; Healthy in her sister; Heart attack in her maternal grandmother; Heart attack (age of onset: 23) in her sister; Lung cancer in her father.     Allergies   Allergen Reactions  . Ciprofloxacin     Other reaction(s): Joint Pains  . Tetanus Toxoids Hives     Current Outpatient Medications:  .  azelastine (ASTELIN) 0.1 % nasal spray, Place 2 sprays into both nostrils 2 (two) times daily. Use in each nostril as directed, Disp: , Rfl:  .  Cholecalciferol 1000 UNITS capsule, Take 1,000 Units by mouth daily. , Disp: , Rfl:  .  clonazePAM (KLONOPIN) 0.25 MG disintegrating tablet, Take 0.25 mg by mouth daily. , Disp: , Rfl: 1 .  CYANOCOBALAMIN PO, Take 1 tablet by mouth daily. , Disp: , Rfl:  .  DULoxetine (CYMBALTA) 30 MG capsule, Take 30 mg by mouth daily., Disp: , Rfl:  .  DULoxetine (CYMBALTA) 60 MG capsule, Take 60 mg by mouth daily. , Disp: , Rfl:  .  Flaxseed, Linseed, (FLAX SEEDS PO), Take 1 capsule by mouth daily., Disp: , Rfl:  .  gabapentin (NEURONTIN) 300 MG capsule, 3 times daily, Disp: , Rfl:  .  HYDROcodone-acetaminophen (NORCO) 10-325 MG tablet, Take 1 tablet by mouth every 4 (four) hours as needed., Disp: 180 tablet, Rfl: 0 .  lamoTRIgine (LAMICTAL) 25 MG tablet, Take 150 mg by mouth daily. , Disp: 60 tablet, Rfl: 0 .  MULTIPLE VITAMINS-MINERALS PO, Take 1 tablet by mouth daily. , Disp: , Rfl:  .  Omega-3 Fatty Acids (FISH OIL) 1200 MG CAPS, Take 1 capsule by mouth daily. , Disp: , Rfl:  .  polyethylene glycol powder (MIRALAX) powder, Take by mouth daily. , Disp: , Rfl:  .  promethazine (PHENERGAN) 25 MG tablet, TAKE 1 TABLET (25 MG TOTAL) BY MOUTH EVERY 6 (SIX) HOURS AS NEEDED FOR NAUSEA., Disp: , Rfl: 0 .  sertraline (ZOLOFT) 100 MG tablet, Take 100 mg by mouth 2 (two) times daily. , Disp: , Rfl:  .  SUMAtriptan (IMITREX) 100 MG tablet, TAKE 1 AT HEADACHE ONSET, MAY REPEAT AFTER 2 HOURS, NO MORE THAN 2 IN A DAY,NO MORE THAN 4 IN A WEEK, Disp: , Rfl:  .  Tetrahydrozoline HCl (EYE DROPS OP), Apply 1 drop to eye 2 (two) times daily. , Disp: , Rfl:  .  topiramate (TOPAMAX) 25 MG tablet, Take 25 mg by mouth daily. 1 tab;et in the morning,  and 4 tablets at bedtime, Disp: , Rfl:  .  VYVANSE 40 MG capsule, Take 50 mg by mouth every morning. , Disp: , Rfl: 0   Patient Care Team: Mar Daring, PA-C as PCP - General (Family Medicine) Arelia Sneddon, [place] as Consulting Physician (Optometry) Manley Mason Donivan Scull, MD as Referring Physician (Dermatology) Oneta Rack, MD as Consulting Physician (Dermatology) Chauncey Mann, MD as Referring Physician (Psychiatry) Ruthy Dick, LCSW as Social Worker Sales promotion account executive)      Objective:   Vitals: BP 116/80 (BP Location: Left Arm, Patient  Position: Sitting, Cuff Size: Normal)   Pulse 92   Temp 98.4 F (36.9 C) (Oral)   Resp 16   Ht 5\' 4"  (1.626 m)   Wt 157 lb (71.2 kg)   SpO2 96%   BMI 26.95 kg/m    Physical Exam  Constitutional: She is oriented to person, place, and time. She appears well-developed and well-nourished. No distress.  HENT:  Head: Normocephalic and atraumatic.  Right Ear: Hearing, tympanic membrane, external ear and ear canal normal.  Left Ear: Hearing, tympanic membrane, external ear and ear canal normal.  Nose: Nose normal.  Mouth/Throat: Uvula is midline, oropharynx is clear and moist and mucous membranes are normal. No oropharyngeal exudate.  Eyes: Conjunctivae and EOM are normal. Pupils are equal, round, and reactive to light. Right eye exhibits no discharge. Left eye exhibits no discharge. No scleral icterus.  Neck: Normal range of motion. Neck supple. No JVD present. Carotid bruit is not present. No tracheal deviation present. No thyromegaly present.  Cardiovascular: Normal rate, regular rhythm, normal heart sounds and intact distal pulses. Exam reveals no gallop and no friction rub.  No murmur heard. Pulmonary/Chest: Effort normal and breath sounds normal. No respiratory distress. She has no wheezes. She has no rales. She exhibits no tenderness. Right breast exhibits no inverted nipple, no mass, no nipple discharge, no skin change  and no tenderness. Left breast exhibits mass. Left breast exhibits no inverted nipple, no nipple discharge, no skin change and no tenderness. Breasts are symmetrical.    Abdominal: Soft. Bowel sounds are normal. She exhibits no distension and no mass. There is no tenderness. There is no rebound and no guarding. Hernia confirmed negative in the right inguinal area and confirmed negative in the left inguinal area.  Genitourinary: Rectum normal, vagina normal and uterus normal. No breast swelling, tenderness, discharge or bleeding. Pelvic exam was performed with patient supine. There is no rash, tenderness, lesion or injury on the right labia. There is no rash, tenderness, lesion or injury on the left labia. Cervix exhibits no motion tenderness, no discharge and no friability. Right adnexum displays no mass, no tenderness and no fullness. Left adnexum displays no mass, no tenderness and no fullness. No erythema, tenderness or bleeding in the vagina. No signs of injury around the vagina. No vaginal discharge found.  Musculoskeletal: Normal range of motion. She exhibits no edema or tenderness.  Lymphadenopathy:    She has no cervical adenopathy.       Right: No inguinal adenopathy present.       Left: No inguinal adenopathy present.  Neurological: She is alert and oriented to person, place, and time. She has normal reflexes. No cranial nerve deficit. Coordination normal.  Skin: Skin is warm and dry. No rash noted. She is not diaphoretic.  Psychiatric: She has a normal mood and affect. Her behavior is normal. Judgment and thought content normal.  Vitals reviewed.    Depression Screen PHQ 2/9 Scores 10/06/2017 12/26/2016 10/04/2016 11/08/2015  PHQ - 2 Score 6 0 2 0  PHQ- 9 Score 18 - 13 -  Exception Documentation - Other- indicate reason in comment box - Other- indicate reason in comment box      Assessment & Plan:     Routine Health Maintenance and Physical Exam  Exercise Activities and Dietary  recommendations Goals    . Exercise 150 minutes per week (moderate activity)     10/06/17- Pt to increase the amount of exercise done. Pt to start walking for 3-4 days  a week for at least 30 minutes at a time.        Immunization History  Administered Date(s) Administered  . Influenza, Seasonal, Injecte, Preservative Fre 06/24/2015  . Influenza,inj,Quad PF,6+ Mos 10/04/2016    Health Maintenance  Topic Date Due  . HIV Screening  06/30/1975  . MAMMOGRAM  11/05/2018  . PAP SMEAR  10/05/2019  . COLONOSCOPY  08/17/2023  . TETANUS/TDAP  10/04/2026  . INFLUENZA VACCINE  Completed  . Hepatitis C Screening  Completed     Discussed health benefits of physical activity, and encouraged her to engage in regular exercise appropriate for her age and condition.    1. Annual physical exam Normal physical exam today. Will check labs as below and f/u pending lab results. If labs are stable and WNL she will not need to have these rechecked for one year at her next annual physical exam. She is to call the office in the meantime if she has any acute issue, questions or concerns.  2. Cervical cancer screening Pap collected today. Will send as below and f/u pending results. Was abnormal last year. If normal will need pap for 2 more years annually. If abnormal again will refer to GYN.  - Pap IG w/ reflex to HPV when ASC-U  3. Atypical squamous cells of undetermined significance on cytologic smear of cervix (ASC-US) See above medical treatment plan. - Pap IG w/ reflex to HPV when ASC-U  4. Breast cancer screening H/O abnormal mammogram with left breast cyst. Now with continued fullness in left breast. Will order diagnostic mammogram and [place] as below.  - MM DIAG BREAST TOMO BILATERAL; Future - US BREAST LTD UNI LEFT INC AXILLA; Future - US BREAST LTD UNI RIGHT INC AXILLA; Future  5. Benign cyst of left breast See above medical treatment plan. - MM DIAG BREAST TOMO BILATERAL; Future - US BREAST  LTD UNI LEFT INC AXILLA; Future - US BREAST LTD UNI RIGHT INC AXILLA; Future  6. Colon cancer screening OC lite collected today and was negative. - IFOBT POC (occult bld, rslt in office)  7. Abnormal thyroid stimulating hormone (TSH) level H/O this. Not on treatment. Will check labs as below and f/u pending results. - TSH  8. Hypercholesterolemia without hypertriglyceridemia Diet controlled. Will check labs as below and f/u pending results. - Comprehensive Metabolic Panel (CMET) - Lipid Profile  9. Avitaminosis D Having hand tremors and has h/o Vit D def not on treatment. I will check labs and f/u pending results. She is already followed annually by Dr. Melrose Nakayama and if labs are normal she is to follow up with him for her tremor. She voiced understanding.  - CBC w/Diff/Platelet - Vitamin D (25 hydroxy)  10. B12 deficiency H/O this with new hand tremor. Will check labs as below and f/u pending results. See above medical treatment plan. - CBC w/Diff/Platelet - B12  11. Tremor of both hands See above medical treatment plan. - Vitamin D (25 hydroxy) - B12  --------------------------------------------------------------------    Mar Daring, PA-C  [facility]

## 2017-10-09 NOTE — Patient Instructions (Signed)
Health Maintenance for Postmenopausal Women Menopause is a normal process in which your reproductive ability comes to an end. This process happens gradually over a span of months to years, usually between the ages of 22 and 9. Menopause is complete when you have missed 12 consecutive menstrual periods. It is important to talk with your health care provider about some of the most common conditions that affect postmenopausal women, such as heart disease, cancer, and bone loss (osteoporosis). Adopting a healthy lifestyle and getting preventive care can help to promote your health and wellness. Those actions can also lower your chances of developing some of these common conditions. What should I know about menopause? During menopause, you may experience a number of symptoms, such as:  Moderate-to-severe hot flashes.  Night sweats.  Decrease in sex drive.  Mood swings.  Headaches.  Tiredness.  Irritability.  Memory problems.  Insomnia.  Choosing to treat or not to treat menopausal changes is an individual decision that you make with your health care provider. What should I know about hormone replacement therapy and supplements? Hormone therapy products are effective for treating symptoms that are associated with menopause, such as hot flashes and night sweats. Hormone replacement carries certain risks, especially as you become older. If you are thinking about using estrogen or estrogen with progestin treatments, discuss the benefits and risks with your health care provider. What should I know about heart disease and stroke? Heart disease, heart attack, and stroke become more likely as you age. This may be due, in part, to the hormonal changes that your body experiences during menopause. These can affect how your body processes dietary fats, triglycerides, and cholesterol. Heart attack and stroke are both medical emergencies. There are many things that you can do to help prevent heart disease  and stroke:  Have your blood pressure checked at least every 1-2 years. High blood pressure causes heart disease and increases the risk of stroke.  If you are 53-22 years old, ask your health care provider if you should take aspirin to prevent a heart attack or a stroke.  Do not use any tobacco products, including cigarettes, chewing tobacco, or electronic cigarettes. If you need help quitting, ask your health care provider.  It is important to eat a healthy diet and maintain a healthy weight. ? Be sure to include plenty of vegetables, fruits, low-fat dairy products, and lean protein. ? Avoid eating foods that are high in solid fats, added sugars, or salt (sodium).  Get regular exercise. This is one of the most important things that you can do for your health. ? Try to exercise for at least 150 minutes each week. The type of exercise that you do should increase your heart rate and make you sweat. This is known as moderate-intensity exercise. ? Try to do strengthening exercises at least twice each week. Do these in addition to the moderate-intensity exercise.  Know your numbers.Ask your health care provider to check your cholesterol and your blood glucose. Continue to have your blood tested as directed by your health care provider.  What should I know about cancer screening? There are several types of cancer. Take the following steps to reduce your risk and to catch any cancer development as early as possible. Breast Cancer  Practice breast self-awareness. ? This means understanding how your breasts normally appear and feel. ? It also means doing regular breast self-exams. Let your health care provider know about any changes, no matter how small.  If you are 40  or older, have a clinician do a breast exam (clinical breast exam or CBE) every year. Depending on your age, family history, and medical history, it may be recommended that you also have a yearly breast X-ray (mammogram).  If you  have a family history of breast cancer, talk with your health care provider about genetic screening.  If you are at high risk for breast cancer, talk with your health care provider about having an MRI and a mammogram every year.  Breast cancer (BRCA) gene test is recommended for women who have family members with BRCA-related cancers. Results of the assessment will determine the need for genetic counseling and BRCA1 and for BRCA2 testing. BRCA-related cancers include these types: ? Breast. This occurs in males or females. ? Ovarian. ? Tubal. This may also be called fallopian tube cancer. ? Cancer of the abdominal or pelvic lining (peritoneal cancer). ? Prostate. ? Pancreatic.  Cervical, Uterine, and Ovarian Cancer Your health care provider may recommend that you be screened regularly for cancer of the pelvic organs. These include your ovaries, uterus, and vagina. This screening involves a pelvic exam, which includes checking for microscopic changes to the surface of your cervix (Pap test).  For women ages 21-65, health care providers may recommend a pelvic exam and a Pap test every three years. For women ages 79-65, they may recommend the Pap test and pelvic exam, combined with testing for human papilloma virus (HPV), every five years. Some types of HPV increase your risk of cervical cancer. Testing for HPV may also be done on women of any age who have unclear Pap test results.  Other health care providers may not recommend any screening for nonpregnant women who are considered low risk for pelvic cancer and have no symptoms. Ask your health care provider if a screening pelvic exam is right for you.  If you have had past treatment for cervical cancer or a condition that could lead to cancer, you need Pap tests and screening for cancer for at least 20 years after your treatment. If Pap tests have been discontinued for you, your risk factors (such as having a new sexual partner) need to be  reassessed to determine if you should start having screenings again. Some women have medical problems that increase the chance of getting cervical cancer. In these cases, your health care provider may recommend that you have screening and Pap tests more often.  If you have a family history of uterine cancer or ovarian cancer, talk with your health care provider about genetic screening.  If you have vaginal bleeding after reaching menopause, tell your health care provider.  There are currently no reliable tests available to screen for ovarian cancer.  Lung Cancer Lung cancer screening is recommended for adults 69-62 years old who are at high risk for lung cancer because of a history of smoking. A yearly low-dose CT scan of the lungs is recommended if you:  Currently smoke.  Have a history of at least 30 pack-years of smoking and you currently smoke or have quit within the past 15 years. A pack-year is smoking an average of one pack of cigarettes per day for one year.  Yearly screening should:  Continue until it has been 15 years since you quit.  Stop if you develop a health problem that would prevent you from having lung cancer treatment.  Colorectal Cancer  This type of cancer can be detected and can often be prevented.  Routine colorectal cancer screening usually begins at  age 42 and continues through age 45.  If you have risk factors for colon cancer, your health care provider may recommend that you be screened at an earlier age.  If you have a family history of colorectal cancer, talk with your health care provider about genetic screening.  Your health care provider may also recommend using home test kits to check for hidden blood in your stool.  A small camera at the end of a tube can be used to examine your colon directly (sigmoidoscopy or colonoscopy). This is done to check for the earliest forms of colorectal cancer.  Direct examination of the colon should be repeated every  5-10 years until age 71. However, if early forms of precancerous polyps or small growths are found or if you have a family history or genetic risk for colorectal cancer, you may need to be screened more often.  Skin Cancer  Check your skin from head to toe regularly.  Monitor any moles. Be sure to tell your health care provider: ? About any new moles or changes in moles, especially if there is a change in a mole's shape or color. ? If you have a mole that is larger than the size of a pencil eraser.  If any of your family members has a history of skin cancer, especially at a young age, talk with your health care provider about genetic screening.  Always use sunscreen. Apply sunscreen liberally and repeatedly throughout the day.  Whenever you are outside, protect yourself by wearing long sleeves, pants, a wide-brimmed hat, and sunglasses.  What should I know about osteoporosis? Osteoporosis is a condition in which bone destruction happens more quickly than new bone creation. After menopause, you may be at an increased risk for osteoporosis. To help prevent osteoporosis or the bone fractures that can happen because of osteoporosis, the following is recommended:  If you are 46-71 years old, get at least 1,000 mg of calcium and at least 600 mg of vitamin D per day.  If you are older than age 55 but younger than age 65, get at least 1,200 mg of calcium and at least 600 mg of vitamin D per day.  If you are older than age 54, get at least 1,200 mg of calcium and at least 800 mg of vitamin D per day.  Smoking and excessive alcohol intake increase the risk of osteoporosis. Eat foods that are rich in calcium and vitamin D, and do weight-bearing exercises several times each week as directed by your health care provider. What should I know about how menopause affects my mental health? Depression may occur at any age, but it is more common as you become older. Common symptoms of depression  include:  Low or sad mood.  Changes in sleep patterns.  Changes in appetite or eating patterns.  Feeling an overall lack of motivation or enjoyment of activities that you previously enjoyed.  Frequent crying spells.  Talk with your health care provider if you think that you are experiencing depression. What should I know about immunizations? It is important that you get and maintain your immunizations. These include:  Tetanus, diphtheria, and pertussis (Tdap) booster vaccine.  Influenza every year before the flu season begins.  Pneumonia vaccine.  Shingles vaccine.  Your health care provider may also recommend other immunizations. This information is not intended to replace advice given to you by your health care provider. Make sure you discuss any questions you have with your health care provider. Document Released: 11/01/2005  Document Revised: 03/29/2016 Document Reviewed: 06/13/2015 Elsevier Interactive Patient Education  2018 [employer]  

## 2017-10-10 LAB — CBC WITH DIFFERENTIAL/PLATELET
Basophils Absolute: 0 10*3/uL (ref 0.0–0.2)
Basos: 1 %
EOS (ABSOLUTE): 0.1 10*3/uL (ref 0.0–0.4)
EOS: 1 %
HEMATOCRIT: 35.6 % (ref 34.0–46.6)
HEMOGLOBIN: 11.9 g/dL (ref 11.1–15.9)
IMMATURE GRANS (ABS): 0 10*3/uL (ref 0.0–0.1)
IMMATURE GRANULOCYTES: 0 %
LYMPHS: 31 %
Lymphocytes Absolute: 2 10*3/uL (ref 0.7–3.1)
MCH: 30.2 pg (ref 26.6–33.0)
MCHC: 33.4 g/dL (ref 31.5–35.7)
MCV: 90 fL (ref 79–97)
MONOCYTES: 8 %
MONOS ABS: 0.5 10*3/uL (ref 0.1–0.9)
NEUTROS PCT: 59 %
Neutrophils Absolute: 3.8 10*3/uL (ref 1.4–7.0)
Platelets: 241 10*3/uL (ref 150–379)
RBC: 3.94 x10E6/uL (ref 3.77–5.28)
RDW: 13.4 % (ref 12.3–15.4)
WBC: 6.5 10*3/uL (ref 3.4–10.8)

## 2017-10-10 LAB — COMPREHENSIVE METABOLIC PANEL
A/G RATIO: 2 (ref 1.2–2.2)
ALT: 13 IU/L (ref 0–32)
AST: 18 IU/L (ref 0–40)
Albumin: 4.5 g/dL (ref 3.5–5.5)
Alkaline Phosphatase: 101 IU/L (ref 39–117)
BUN/Creatinine Ratio: 27 — ABNORMAL HIGH (ref 9–23)
BUN: 21 mg/dL (ref 6–24)
Bilirubin Total: 0.3 mg/dL (ref 0.0–1.2)
CALCIUM: 9.6 mg/dL (ref 8.7–10.2)
CO2: 21 mmol/L (ref 20–29)
CREATININE: 0.77 mg/dL (ref 0.57–1.00)
Chloride: 107 mmol/L — ABNORMAL HIGH (ref 96–106)
GFR, EST AFRICAN AMERICAN: 99 mL/min/{1.73_m2} (ref >59)
GFR, EST NON AFRICAN AMERICAN: 86 mL/min/{1.73_m2} (ref >59)
GLOBULIN, TOTAL: 2.2 g/dL (ref 1.5–4.5)
Glucose: 93 mg/dL (ref 65–99)
Potassium: 4.1 mmol/L (ref 3.5–5.2)
SODIUM: 144 mmol/L (ref 134–144)
TOTAL PROTEIN: 6.7 g/dL (ref 6.0–8.5)

## 2017-10-10 LAB — LIPID PANEL
CHOL/HDL RATIO: 3 ratio (ref 0.0–4.4)
CHOLESTEROL TOTAL: 214 mg/dL — AB (ref 100–199)
HDL: 71 mg/dL (ref >39)
LDL CALC: 131 mg/dL — AB (ref 0–99)
TRIGLYCERIDES: 60 mg/dL (ref 0–149)
VLDL Cholesterol Cal: 12 mg/dL (ref 5–40)

## 2017-10-10 LAB — VITAMIN B12: VITAMIN B 12: 1253 pg/mL — AB (ref 232–1245)

## 2017-10-10 LAB — VITAMIN D 25 HYDROXY (VIT D DEFICIENCY, FRACTURES): Vit D, 25-Hydroxy: 39.9 ng/mL (ref 30.0–100.0)

## 2017-10-10 LAB — TSH: TSH: 0.386 u[IU]/mL — ABNORMAL LOW (ref 0.450–4.500)

## 2017-10-13 ENCOUNTER — Telehealth: Payer: Self-pay | Admitting: Physician Assistant

## 2017-10-13 ENCOUNTER — Telehealth: Payer: Self-pay

## 2017-10-13 NOTE — Telephone Encounter (Signed)
Per [place] [facility] pt is supposed to go back to screening mammograms unless there is a a new breast problem

## 2017-10-13 NOTE — Telephone Encounter (Signed)
Left message to call back  

## 2017-10-13 NOTE — Telephone Encounter (Signed)
-----   Message from Mar Daring, PA-C sent at 10/13/2017  8:22 AM EST ----- B12 is high indicating you are receiving enough supplementation. Vit D is normal. Blood count is normal. Kidney and liver function are normal. Cholesterol slightly up from last year. Limit fatty foods, red meats and carbs in diet. Thyroid hormone is low, which means the thyroid gland is working too much. Sometimes this can be with stress. I would recommend [place] checking the lab again in 4 weeks. If still elevated we will get a thyroid US.

## 2017-10-13 NOTE — Telephone Encounter (Signed)
LMTCB regarding the mammogram screening coverage and labs results.  Thanks,  -Joseline

## 2017-10-13 NOTE — Telephone Encounter (Signed)
So can we inform patient because patient wants to have the diagnostic and [place]. She was adamant she wanted this again this year because they wouldn't do it last year. We need to inform her insurance wont cover.

## 2017-10-13 NOTE — Telephone Encounter (Signed)
FYI. Thanks.

## 2017-10-13 NOTE — Telephone Encounter (Signed)
Please see other note. Thanks,  -Anisa Leanos

## 2017-10-14 ENCOUNTER — Telehealth: Payer: Self-pay

## 2017-10-14 LAB — PAP IG W/ RFLX HPV ASCU: PAP Smear Comment: 0

## 2017-10-14 NOTE — Telephone Encounter (Signed)
[email or web address]-kw 

## 2017-10-14 NOTE — Telephone Encounter (Signed)
Patient advised.KW 

## 2017-10-14 NOTE — Telephone Encounter (Signed)
-----   Message from Mar Daring, [place] sent at 10/14/2017  8:20 AM EST ----- Pap is normal.  Will repeat in 3-5 years.

## 2017-10-16 NOTE — Telephone Encounter (Signed)
Patient advised as below.  

## 2017-10-16 NOTE — Telephone Encounter (Signed)
lmtcb-Krystal Simpson V Krystal Simpson, RMA  

## 2017-10-17 IMAGING — CR DG FEMUR 2+V*R*
1 series · 4 of 4 positions shown · non-contrast
Comparison: None.

CLINICAL DATA: Back pain for 4 months radiating to the right leg.

EXAM:
RIGHT FEMUR 2 VIEWS

[Series 1: view not recorded · 0.14mm/px · 4 of 4 slices shown]
[im 1/4]
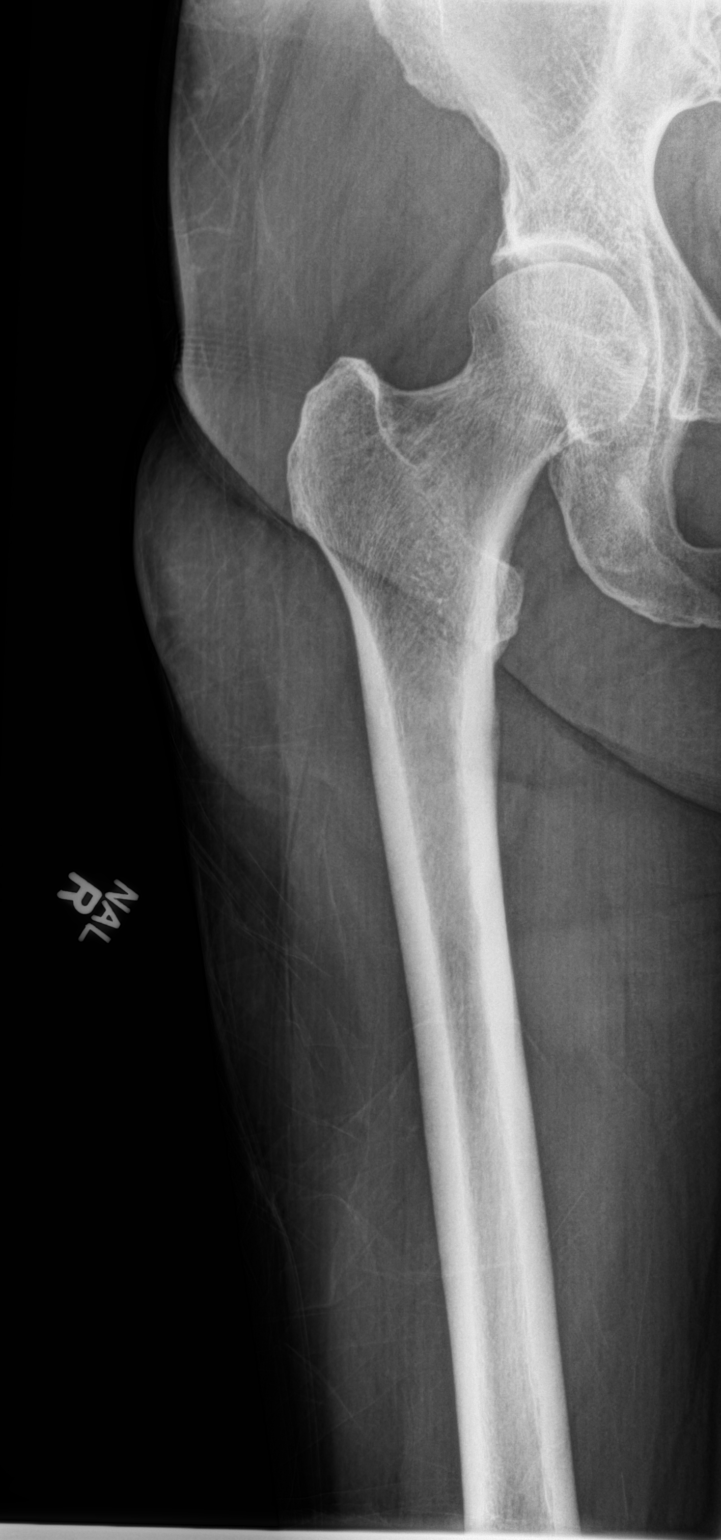
[im 2/4]
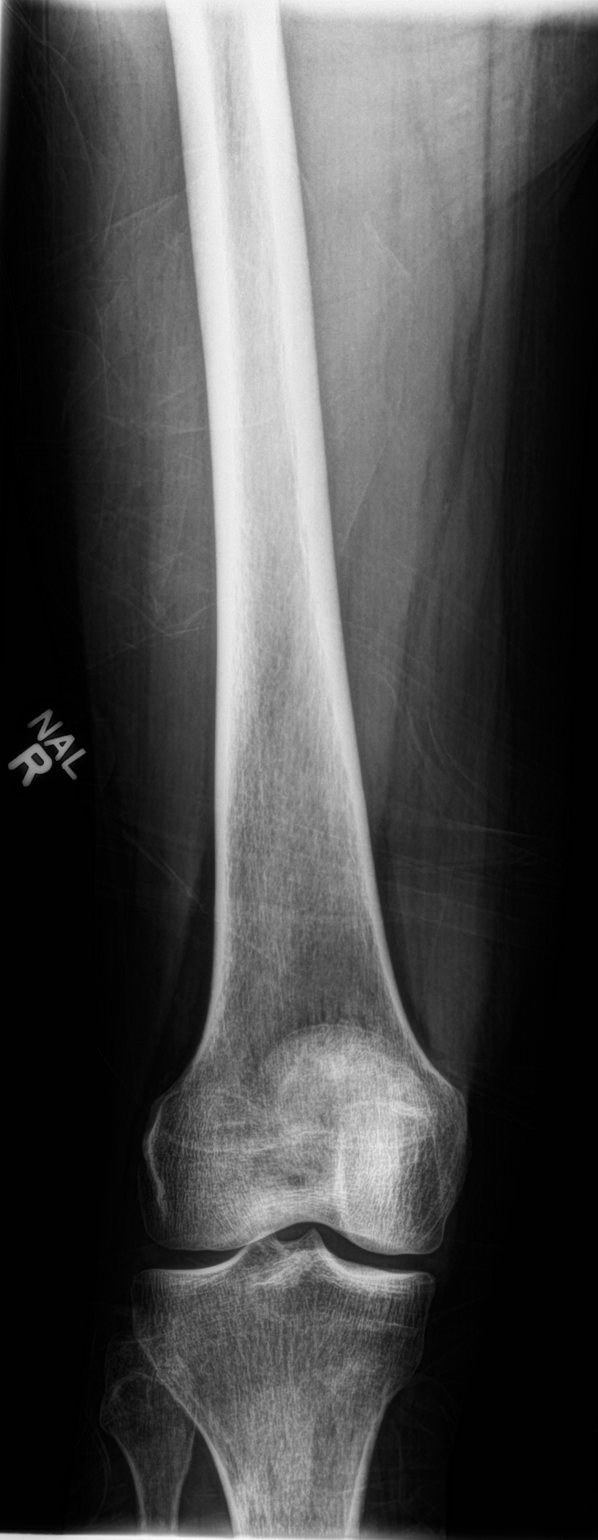
[im 3/4]
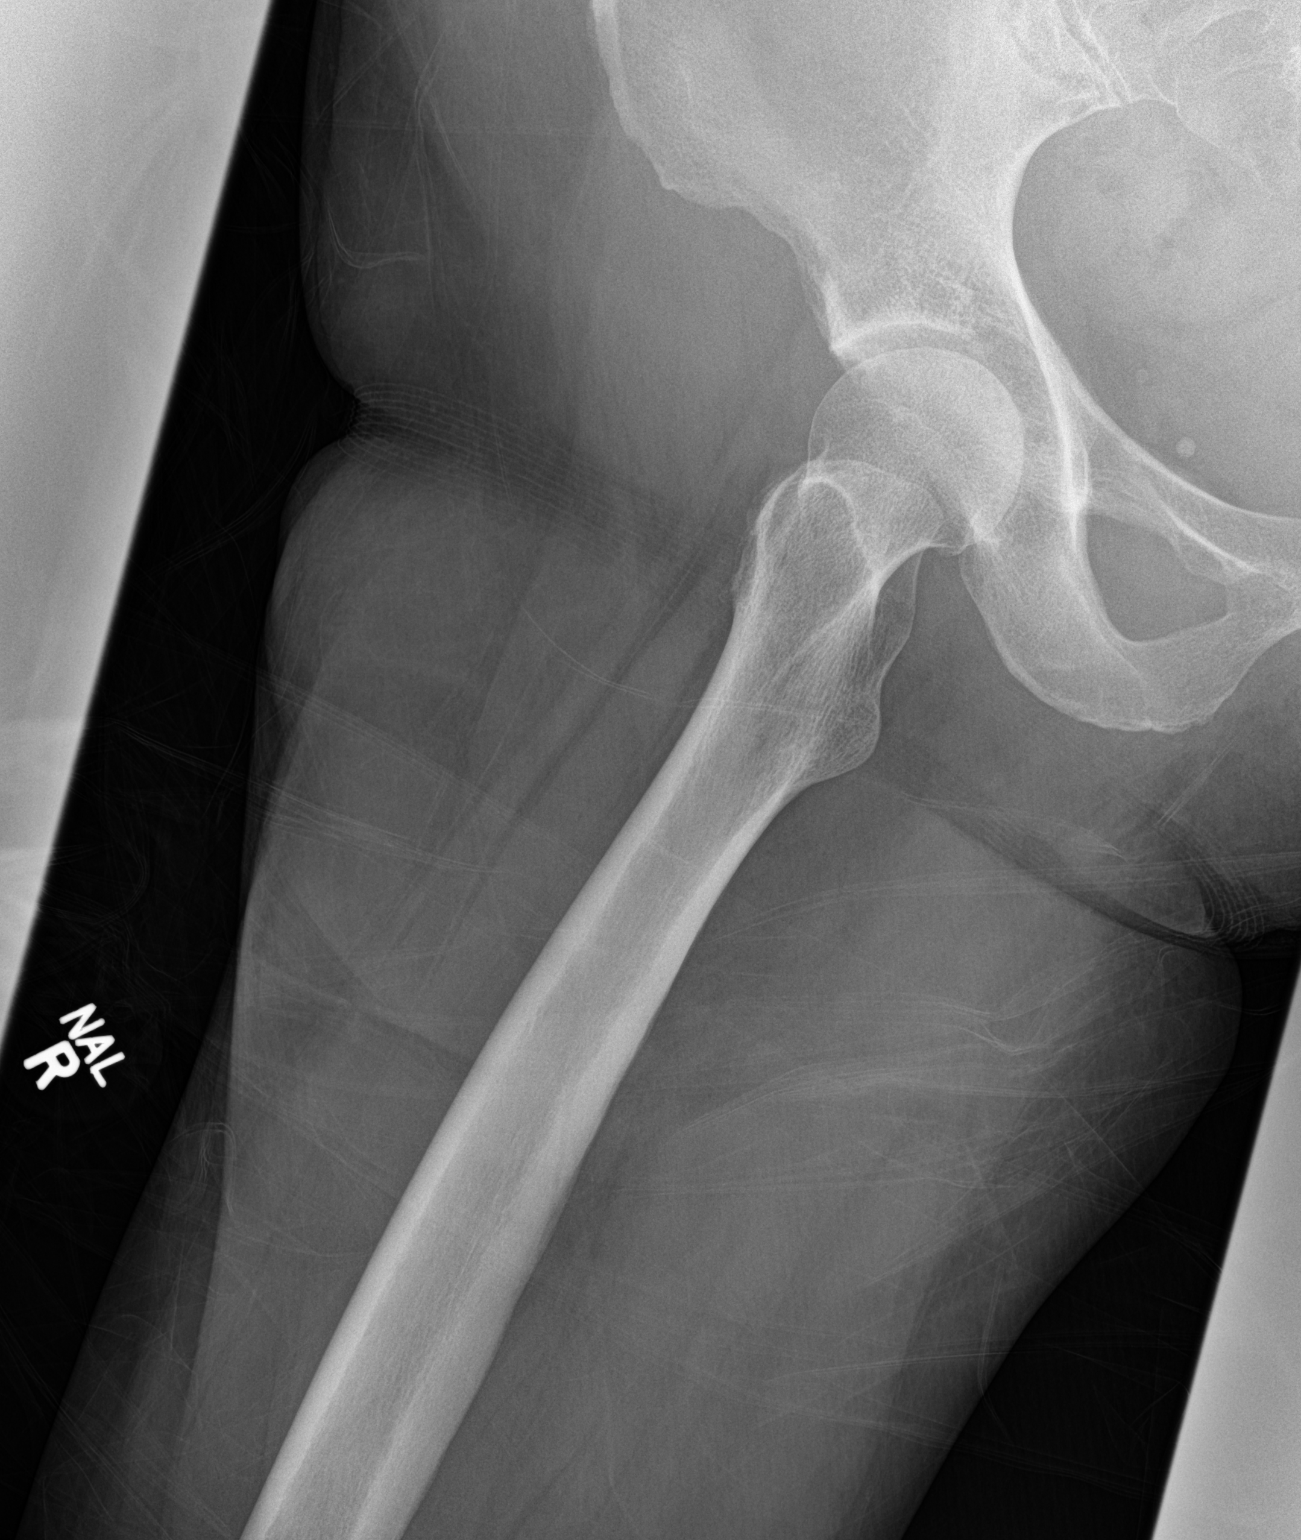
[im 4/4]
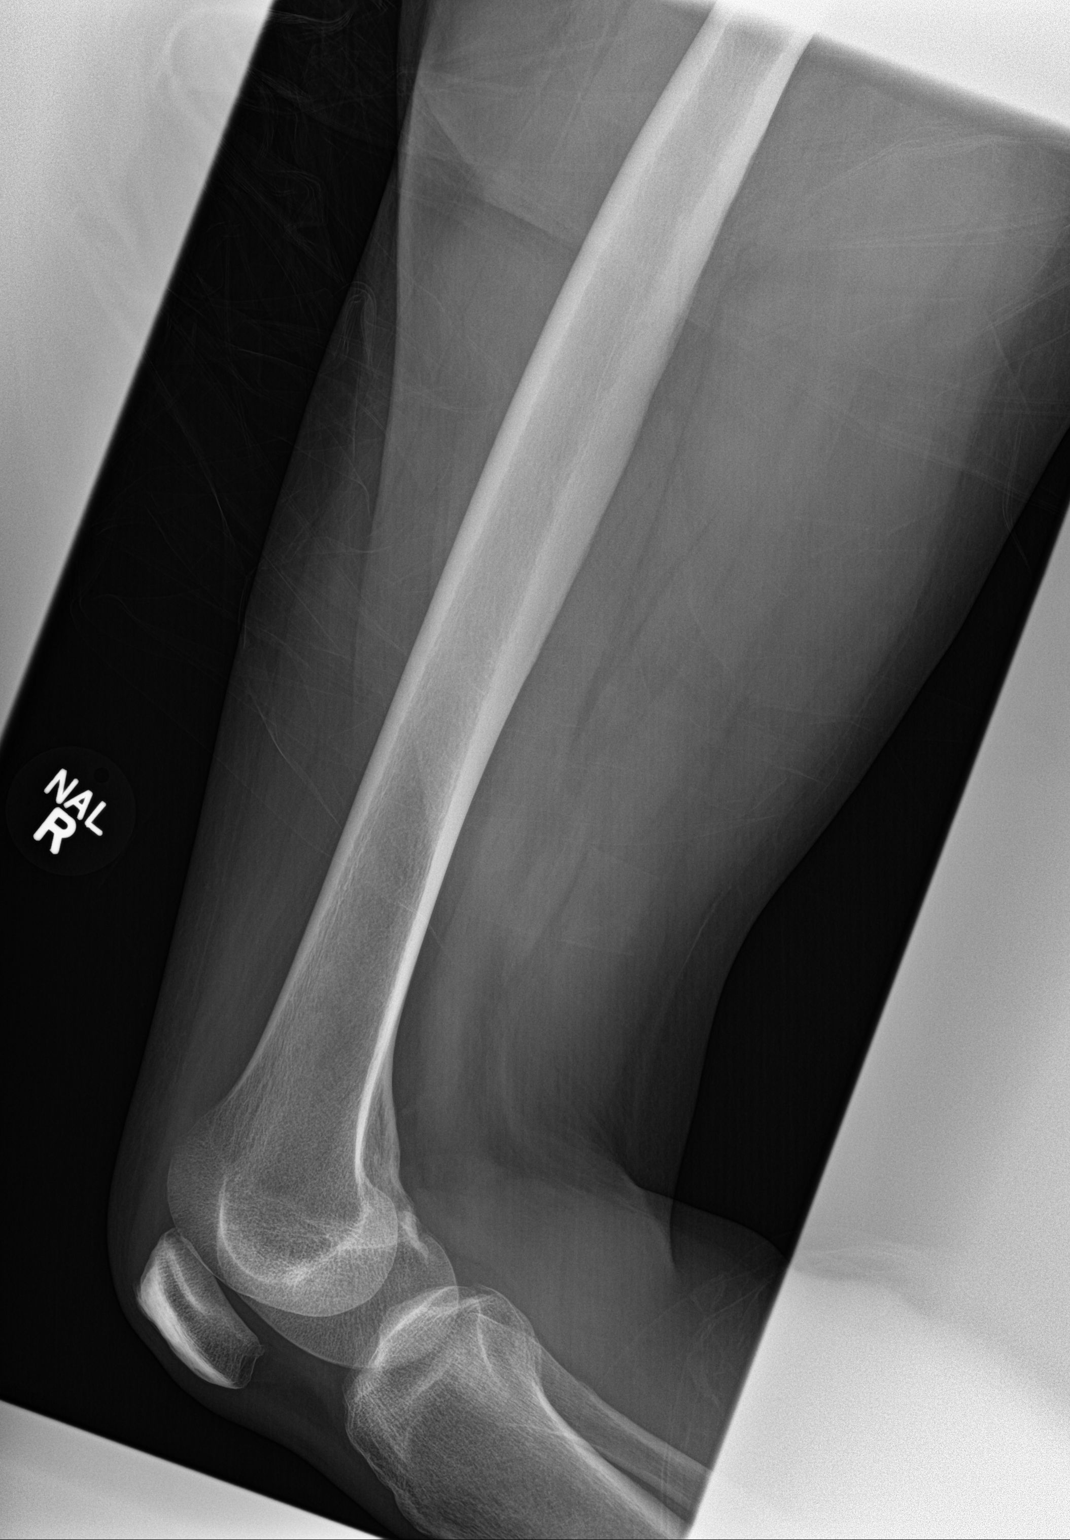

[4 of 4 positions shown; findings below may reference images not displayed]

FINDINGS: No fracture. No bone lesion. Hip and knee joints are normally spaced
and aligned. No arthropathic change. Soft tissues are unremarkable.
IMPRESSION: Negative.

## 2017-10-28 DIAGNOSIS — H5711 Ocular pain, right eye: Secondary | ICD-10-CM | POA: Diagnosis not present

## 2017-10-28 DIAGNOSIS — G43909 Migraine, unspecified, not intractable, without status migrainosus: Secondary | ICD-10-CM | POA: Diagnosis not present

## 2017-10-28 DIAGNOSIS — G479 Sleep disorder, unspecified: Secondary | ICD-10-CM | POA: Diagnosis not present

## 2017-10-28 DIAGNOSIS — M5489 Other dorsalgia: Secondary | ICD-10-CM | POA: Diagnosis not present

## 2017-12-01 ENCOUNTER — Telehealth: Payer: Self-pay | Admitting: Physician Assistant

## 2017-12-01 DIAGNOSIS — E039 Hypothyroidism, unspecified: Secondary | ICD-10-CM

## 2017-12-01 NOTE — Telephone Encounter (Signed)
Please advise 

## 2017-12-01 NOTE — Telephone Encounter (Signed)
Patient said its time to her thyroid levels checked again. Please get order ready.

## 2017-12-01 NOTE — Telephone Encounter (Signed)
Left message on pt's vm advising pt lab was ordered.

## 2017-12-01 NOTE — Telephone Encounter (Signed)
Labs ordered.

## 2017-12-15 DIAGNOSIS — E039 Hypothyroidism, unspecified: Secondary | ICD-10-CM | POA: Diagnosis not present

## 2017-12-16 LAB — THYROID PANEL WITH TSH
Free Thyroxine Index: 1.8 (ref 1.2–4.9)
T3 Uptake Ratio: 24 % (ref 24–39)
T4 TOTAL: 7.5 ug/dL (ref 4.5–12.0)
TSH: 2.59 u[IU]/mL (ref 0.450–4.500)

## 2017-12-17 ENCOUNTER — Other Ambulatory Visit: Payer: Self-pay

## 2017-12-17 DIAGNOSIS — M7661 Achilles tendinitis, right leg: Secondary | ICD-10-CM

## 2017-12-17 DIAGNOSIS — M533 Sacrococcygeal disorders, not elsewhere classified: Secondary | ICD-10-CM

## 2017-12-17 DIAGNOSIS — M7662 Achilles tendinitis, left leg: Principal | ICD-10-CM

## 2017-12-17 NOTE — Telephone Encounter (Signed)
-----   Message from Mar Daring, PA-C sent at 12/16/2017  8:28 AM EDT ----- Thyroid hormone normal.

## 2017-12-17 NOTE — Telephone Encounter (Signed)
na

## 2017-12-18 NOTE — Telephone Encounter (Signed)
Patient advised as directed below.  Patient is requesting refills for the following medication:  HYDROcodone-acetaminophen (NORCO) 10-325 MG tablet   Thanks,  -Brentney Goldbach

## 2017-12-19 ENCOUNTER — Other Ambulatory Visit: Payer: Self-pay | Admitting: Physician Assistant

## 2017-12-19 DIAGNOSIS — M7662 Achilles tendinitis, left leg: Principal | ICD-10-CM

## 2017-12-19 DIAGNOSIS — M533 Sacrococcygeal disorders, not elsewhere classified: Secondary | ICD-10-CM

## 2017-12-19 DIAGNOSIS — M7661 Achilles tendinitis, right leg: Secondary | ICD-10-CM

## 2017-12-19 MED ORDER — HYDROCODONE-ACETAMINOPHEN 10-325 MG PO TABS: 1.0000 | ORAL_TABLET | ORAL | 0 refills | Status: DC | PRN

## 2017-12-19 NOTE — Progress Notes (Signed)
Resent Norco, printed initially in error. Printed Rx voided and shredded.

## 2017-12-27 IMAGING — MR MR LUMBAR SPINE W/O CM
4 of 5 series · 24 of 48 positions shown · non-contrast
Comparison: None.

CLINICAL DATA: Lumbosacral radiculitis. Low back pain. Right
buttock pain.

EXAM:
MRI LUMBAR SPINE WITHOUT CONTRAST
TECHNIQUE: Multiplanar, multisequence MR imaging of the lumbar spine was
performed. No intravenous contrast was administered.

[Series 2: T2 · sagittal · 4.0mm · 0.81mm/px · 6 of 15 slices shown (1 of 2)]
[im 1/15]
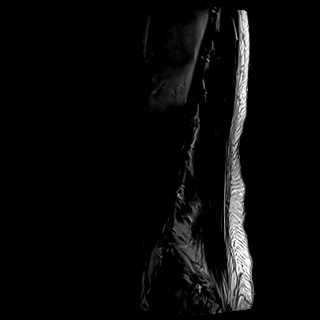
[im 3/15]
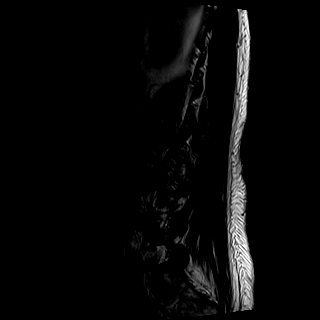
[im 6/15]
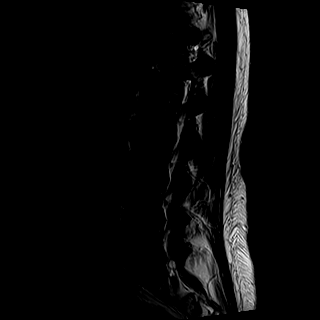
[im 9/15]
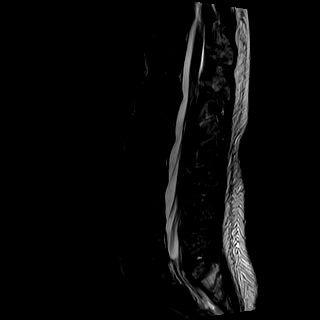
[im 12/15]
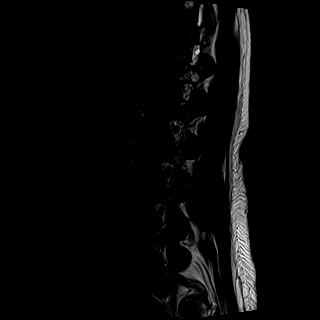
[im 15/15]
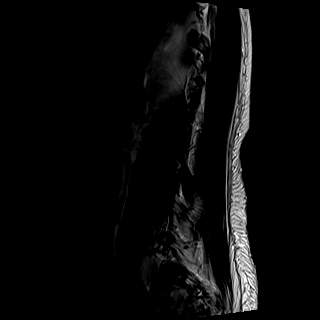

[Series 3: T1 · sagittal · 4.0mm · 0.41mm/px · 6 of 15 slices shown (1 of 2)]
[im 1/15]
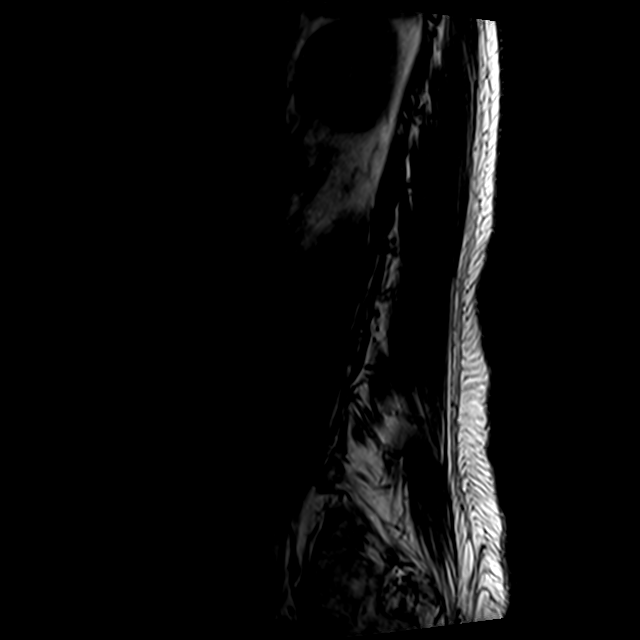
[im 3/15]
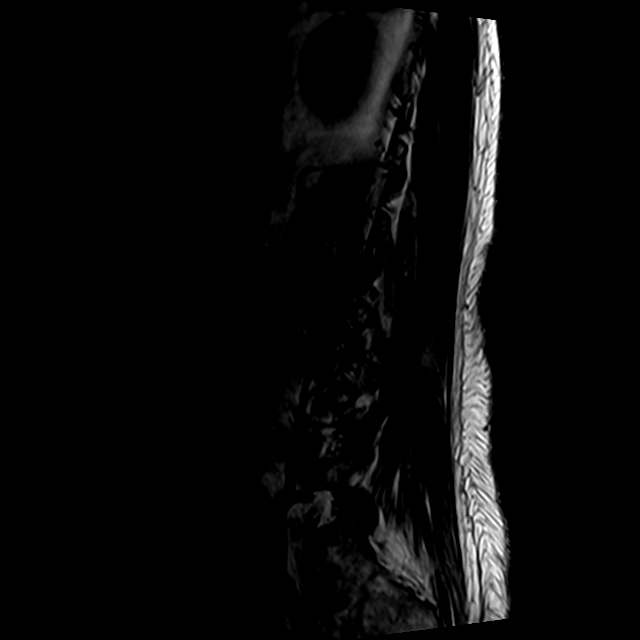
[im 6/15]
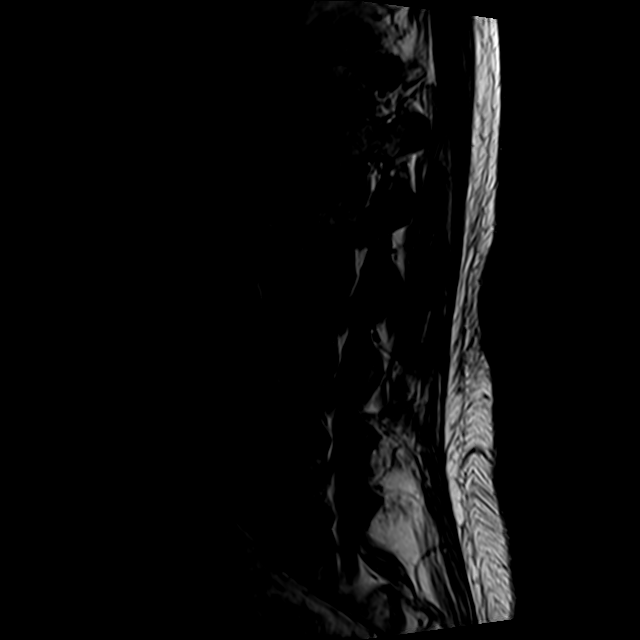
[im 9/15]
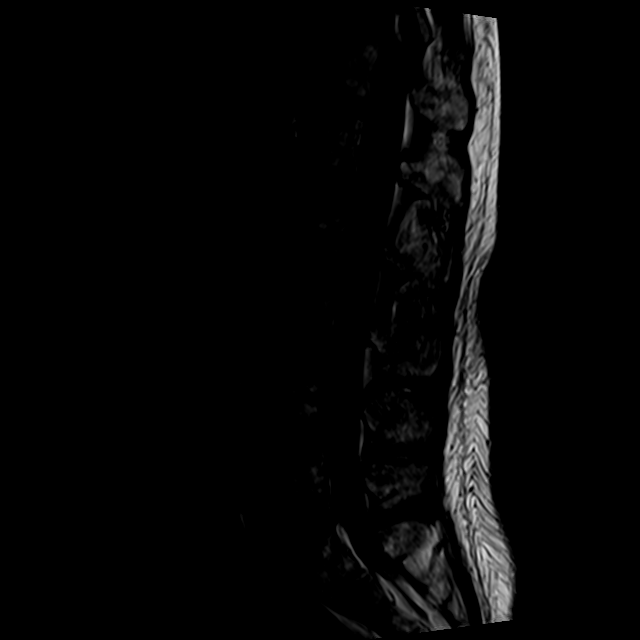
[im 12/15]
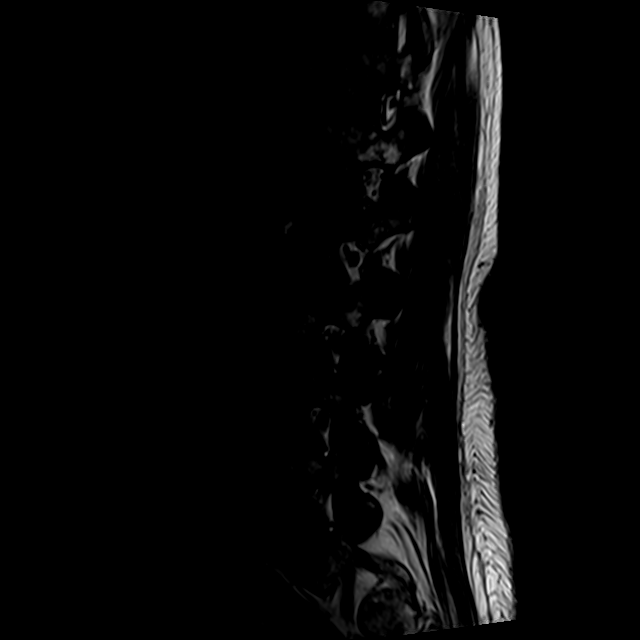
[im 15/15]
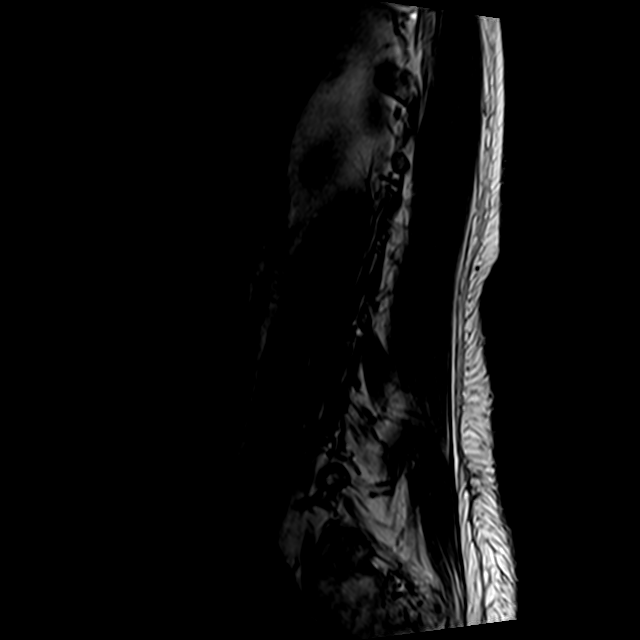

[Series 5: T2 · axial · 4.0mm · 0.78mm/px · z∈[-148,+48]mm · 9 of 35 slices shown (2 of 2)]
[im 1/35]
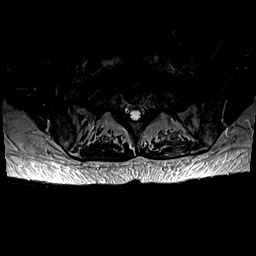
[im 5/35]
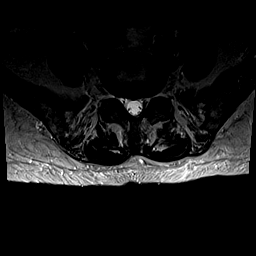
[im 10/35]
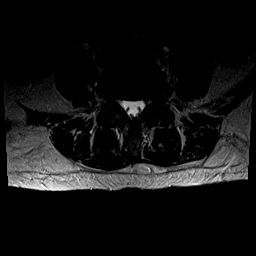
[im 15/35]
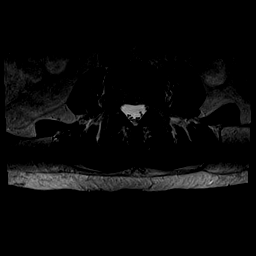
[im 18/35]
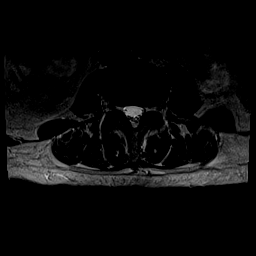
[im 20/35]
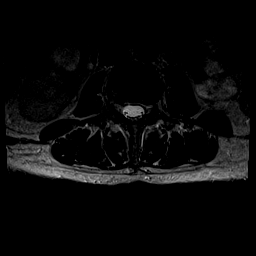
[im 25/35]
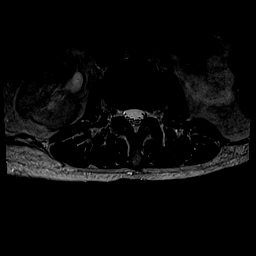
[im 30/35]
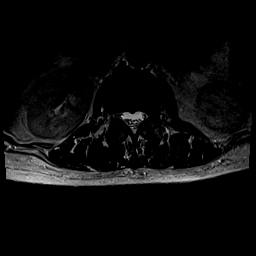
[im 35/35]
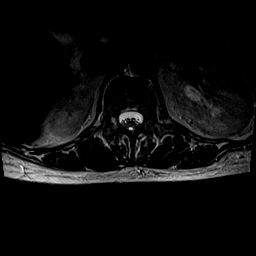

[Series 6: T1 · axial · 4.0mm · 0.31mm/px · z∈[-128,+23]mm · 3 of 35 slices shown (2 of 2)]
[im 5/35]
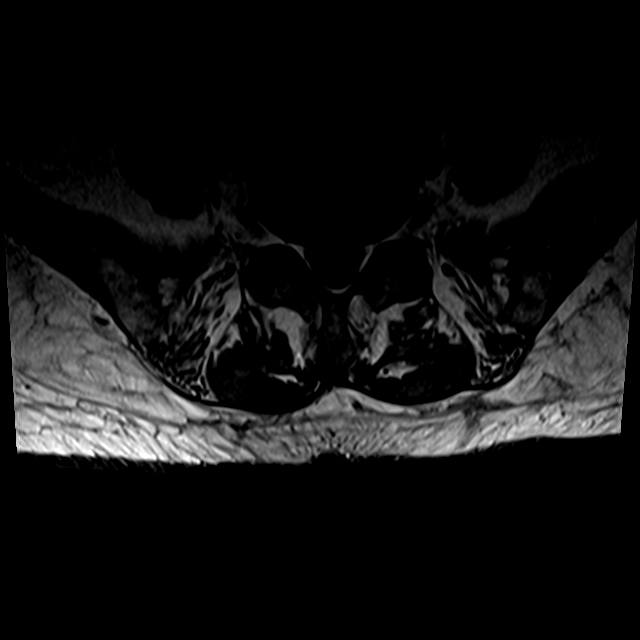
[im 18/35]
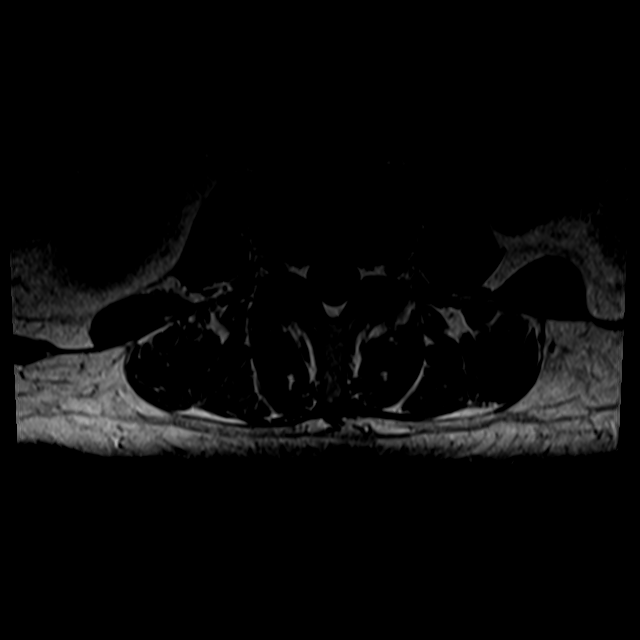
[im 30/35]
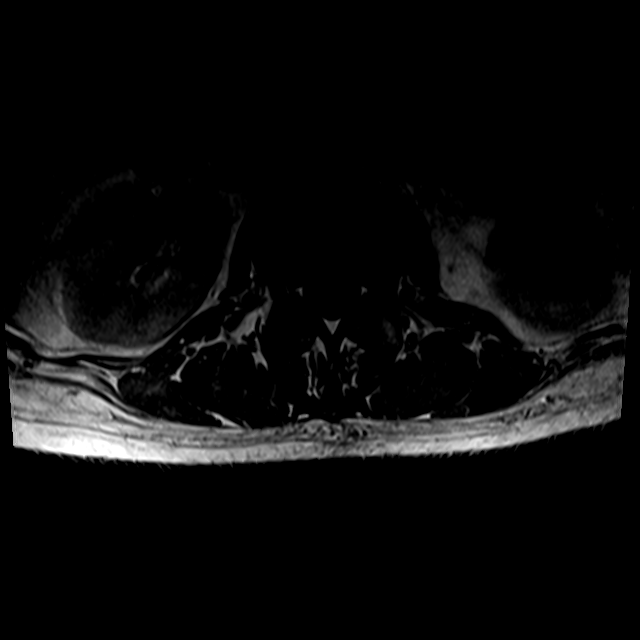

[24 of 48 positions shown; findings below may reference images not displayed]

FINDINGS: The vertebral bodies of the lumbar spine are normal in size. The
vertebral bodies of the lumbar spine are normal in alignment. There
is normal bone marrow signal demonstrated throughout the vertebra.
The intervertebral disc spaces are well-maintained.

The spinal cord is normal in signal and contour. The cord terminates
normally at L1 . The nerve roots of the cauda equina and the filum
terminale are normal.

The visualized portions of the SI joints are unremarkable.

There is mild right renal cortical scarring.

T12-L1: No significant disc bulge. No evidence of neural foraminal
stenosis. No central canal stenosis.

L1-L2: Small central disc protrusion with mild inferior migration of
disc material. No evidence of neural foraminal stenosis. No central
canal stenosis.

L2-L3: No significant disc bulge. No evidence of neural foraminal
stenosis. No central canal stenosis.

L3-L4: No significant disc bulge. No evidence of neural foraminal
stenosis. No central canal stenosis.

L4-L5: No significant disc bulge. No evidence of neural foraminal
stenosis. No central canal stenosis.

L5-S1: No significant disc bulge. No evidence of neural foraminal
stenosis. No central canal stenosis.
IMPRESSION: 1. At L1-2, there is a small central disc protrusion with mild
inferior migration of disc material.

## 2017-12-28 IMAGING — CR DG ABDOMEN 1V
1 series · 2 of 2 positions shown · non-contrast
Comparison: CT scan 09/29/2015

CLINICAL DATA: Abdominal pain, history of kidney stones

EXAM:
ABDOMEN - 1 VIEW

[Series 1: dg abd 1 view · 0.14mm/px · 2 of 2 slices shown]
[im 1/2]
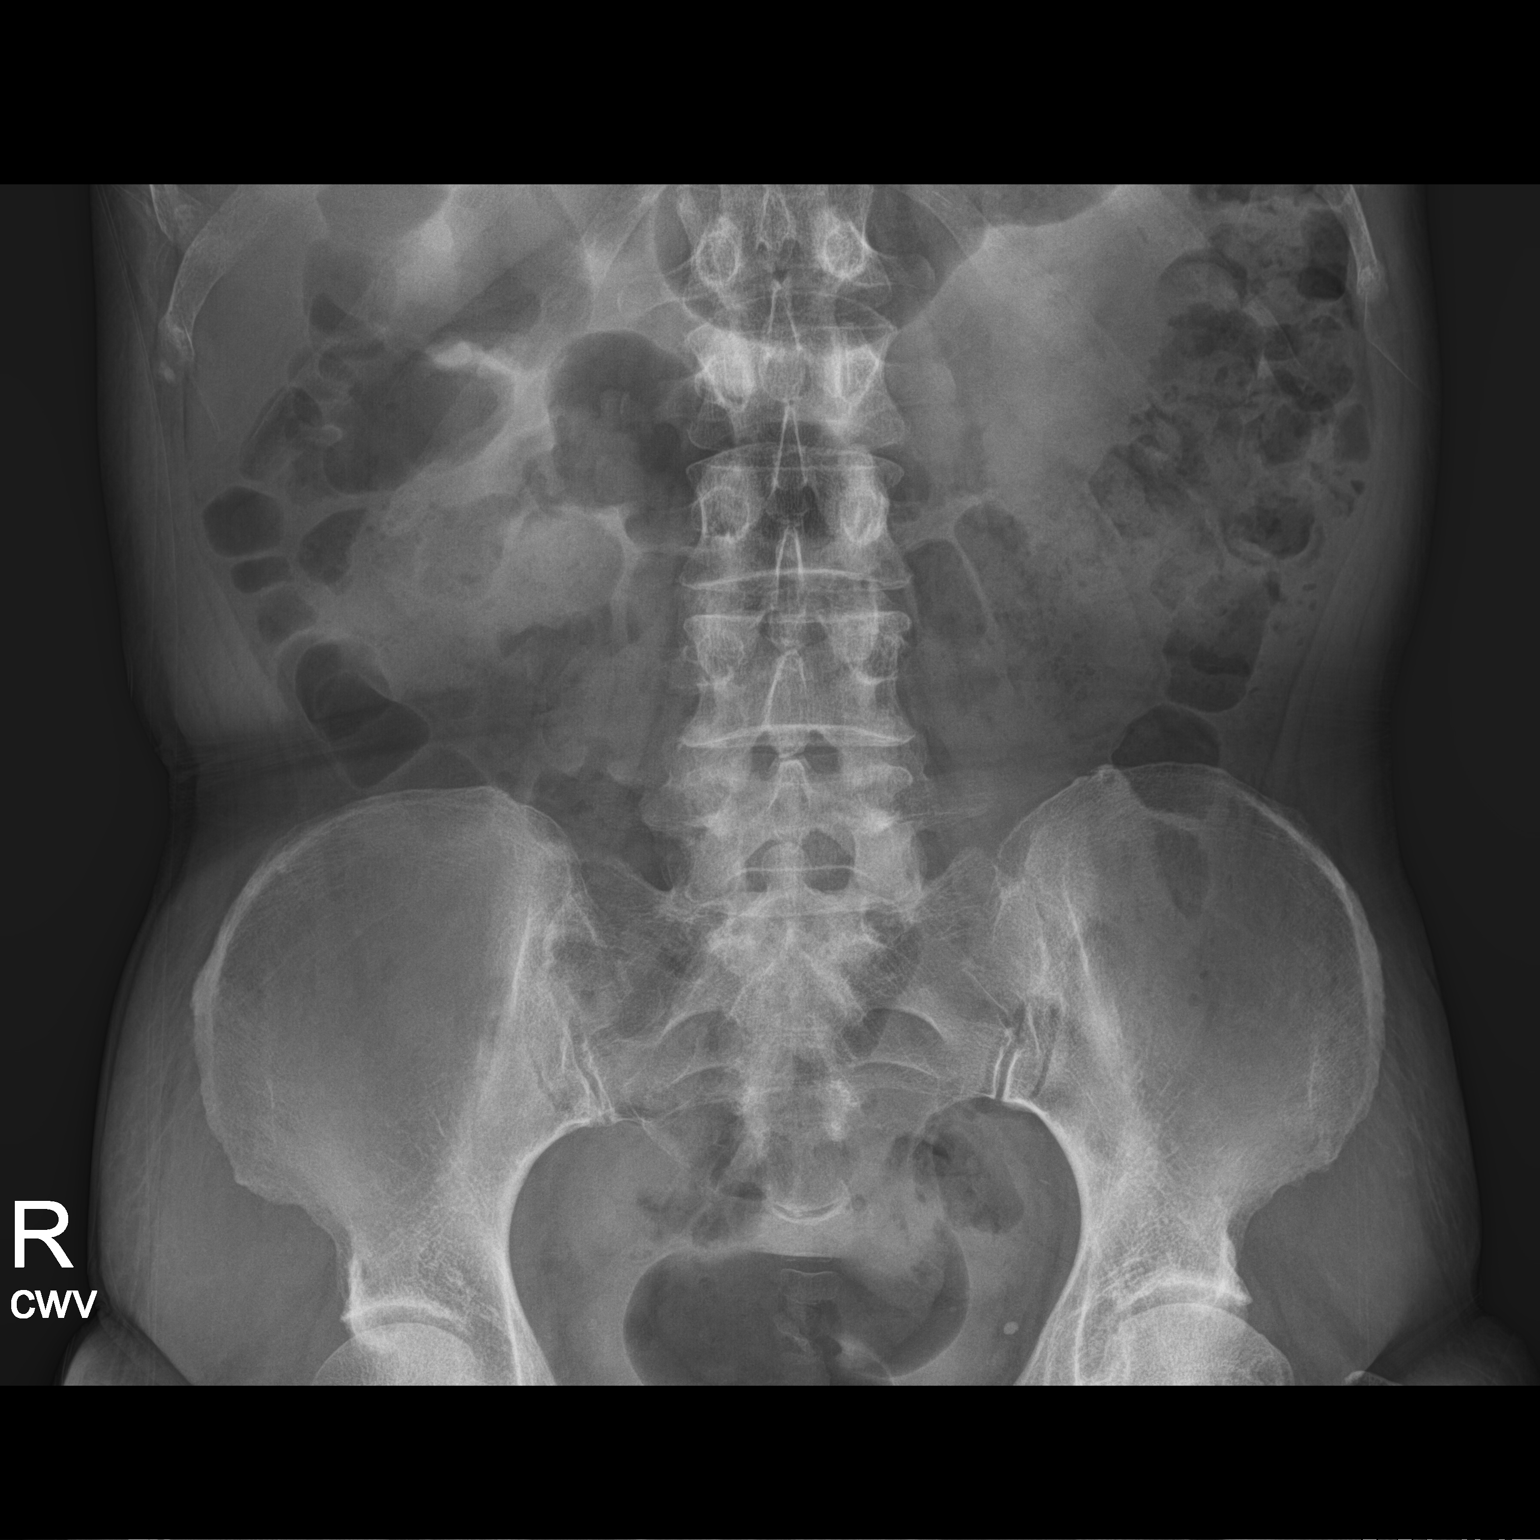
[im 2/2]
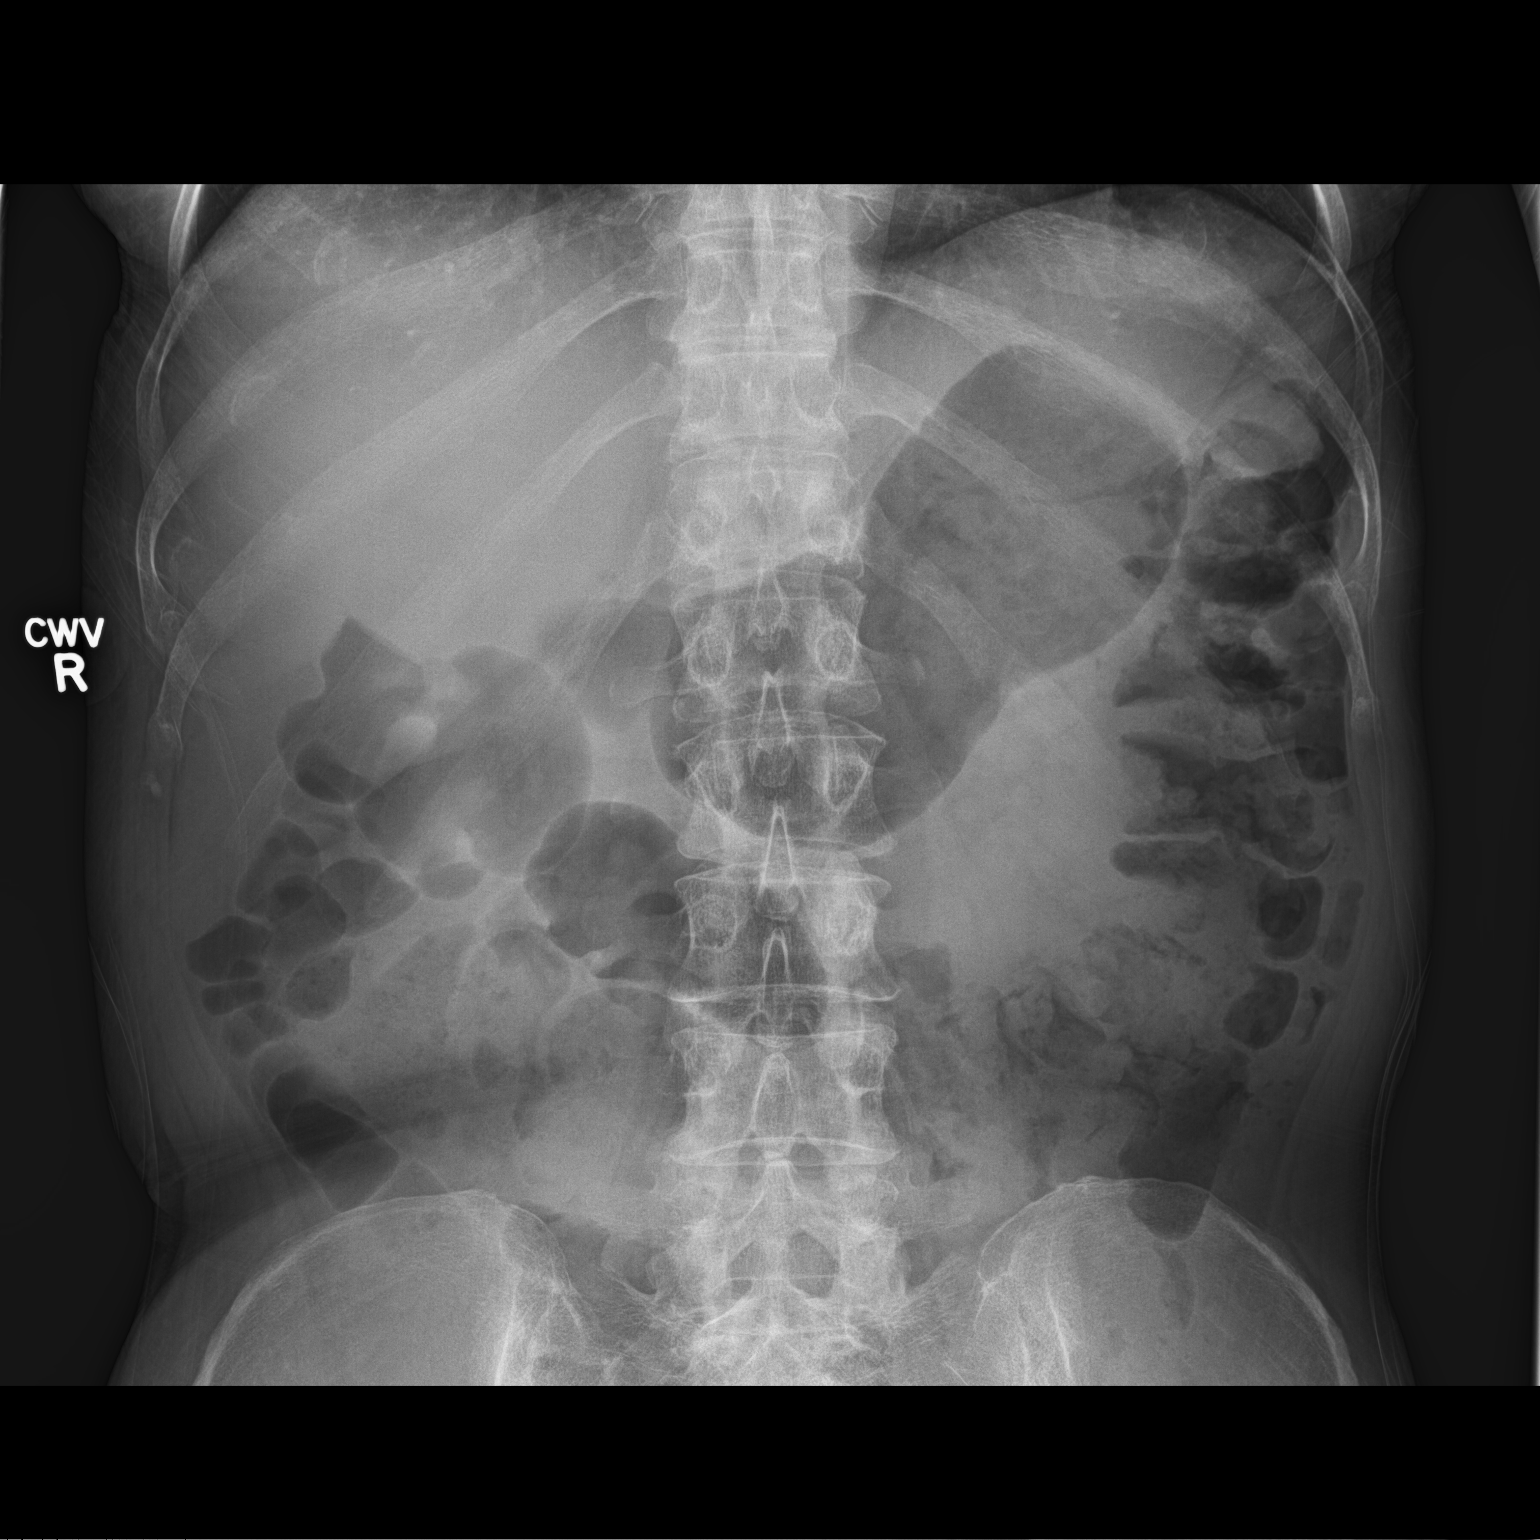

[2 of 2 positions shown; findings below may reference images not displayed]

FINDINGS: There is normal small bowel gas pattern. Moderate colonic stool
noted in transverse colon proximal left colon. Some colonic gas
noted distal sigmoid colon and rectum. No free abdominal air.
IMPRESSION: Normal small bowel gas pattern. Moderate colonic stool in transverse
colon. Some colonic gas noted no free abdominal air.

## 2018-01-06 DIAGNOSIS — F411 Generalized anxiety disorder: Secondary | ICD-10-CM | POA: Diagnosis not present

## 2018-01-06 DIAGNOSIS — F5081 Binge eating disorder: Secondary | ICD-10-CM | POA: Diagnosis not present

## 2018-01-06 DIAGNOSIS — R69 Illness, unspecified: Secondary | ICD-10-CM | POA: Diagnosis not present

## 2018-01-06 DIAGNOSIS — F4312 Post-traumatic stress disorder, chronic: Secondary | ICD-10-CM | POA: Diagnosis not present

## 2018-01-23 ENCOUNTER — Ambulatory Visit
Admission: RE | Admit: 2018-01-23 | Discharge: 2018-01-23 | Disposition: A | Discharge status: Home / Self Care | Payer: Medicare HMO | Source: Ambulatory Visit | Attending: Physician Assistant | Admitting: Physician Assistant

## 2018-01-23 ENCOUNTER — Ambulatory Visit (INDEPENDENT_AMBULATORY_CARE_PROVIDER_SITE_OTHER): Payer: Medicare HMO | Admitting: Physician Assistant

## 2018-01-23 ENCOUNTER — Encounter: Payer: Self-pay | Admitting: Physician Assistant

## 2018-01-23 VITALS — BP 110/80 | HR 109 | Temp 98.0°F | Resp 16 | Wt 182.0 lb

## 2018-01-23 DIAGNOSIS — R0781 Pleurodynia: Secondary | ICD-10-CM | POA: Diagnosis not present

## 2018-01-23 DIAGNOSIS — M62838 Other muscle spasm: Secondary | ICD-10-CM

## 2018-01-23 DIAGNOSIS — W19XXXA Unspecified fall, initial encounter: Secondary | ICD-10-CM | POA: Insufficient documentation

## 2018-01-23 DIAGNOSIS — S299XXA Unspecified injury of thorax, initial encounter: Secondary | ICD-10-CM | POA: Diagnosis not present

## 2018-01-23 DIAGNOSIS — R42 Dizziness and giddiness: Secondary | ICD-10-CM | POA: Diagnosis not present

## 2018-01-23 MED ORDER — BACLOFEN 20 MG PO TABS: 20.0000 mg | ORAL_TABLET | Freq: Three times a day (TID) | ORAL | 1 refills | Status: DC

## 2018-01-23 NOTE — Patient Instructions (Signed)
Rib Contusion A rib contusion is a deep bruise on your rib area. Contusions are the result of a blunt trauma that causes bleeding and injury to the tissues under the skin. A rib contusion may involve bruising of the ribs and of the skin and muscles in the area. The skin overlying the contusion may turn blue, purple, or yellow. Minor injuries will give you a painless contusion, but more severe contusions may stay painful and swollen for a few weeks. What are the causes? A contusion is usually caused by a blow, trauma, or direct force to an area of the body. This often occurs while playing contact sports. What are the signs or symptoms?  Swelling and redness of the injured area.  Discoloration of the injured area.  Tenderness and soreness of the injured area.  Pain with or without movement. How is this diagnosed? The diagnosis can be made by taking a medical history and performing a physical exam. An X-ray, CT scan, or MRI may be needed to determine if there were any associated injuries, such as broken bones (fractures) or internal injuries. How is this treated? Often, the best treatment for a rib contusion is rest. Icing or applying cold compresses to the injured area may help reduce swelling and inflammation. Deep breathing exercises may be recommended to reduce the risk of partial lung collapse and pneumonia. Over-the-counter or prescription medicines may also be recommended for pain control. Follow these instructions at home:  Apply ice to the injured area: ? Put ice in a plastic bag. ? Place a towel between your skin and the bag. ? Leave the ice on for 20 minutes, 2-3 times per day.  Take medicines only as directed by your health care provider.  Rest the injured area. Avoid strenuous activity and any activities or movements that cause pain. Be careful during activities and avoid bumping the injured area.  Perform deep-breathing exercises as directed by your health care provider.  Do  not lift anything that is heavier than 5 lb (2.3 kg) until your health care provider approves.  Do not use any tobacco products, including cigarettes, chewing tobacco, or electronic cigarettes. If you need help quitting, ask your health care provider. Contact a health care provider if:  You have increased bruising or swelling.  You have pain that is not controlled with treatment.  You have a fever. Get help right away if:  You have difficulty breathing or shortness of breath.  You develop a continual cough, or you cough up thick or bloody sputum.  You feel sick to your stomach (nauseous), you throw up (vomit), or you have abdominal pain. This information is not intended to replace advice given to you by your health care provider. Make sure you discuss any questions you have with your health care provider. Document Released: 06/04/2001 Document Revised: 02/15/2016 Document Reviewed: 06/21/2014 Elsevier Interactive Patient Education  2018 [employer]  

## 2018-01-23 NOTE — Progress Notes (Signed)
Patient: Krystal Simpson Female    DOB: 15-Mar-1961   57 y.o.   MRN: [id number] Visit Date: 01/23/2018  Today's Provider: Mar Daring, PA-C   Chief Complaint  Patient presents with  . Breast Pain  . Fall   Subjective:    HPI Patient here today C/O pain under left breast, patient reports she fell on 01/19/18 at home. Patient reports she fell on to her left side. Tripped over her dog. Patient reports taking Norco and reports mild pain control. Patient reports she feels "off balance" since right ear surgery done 2 years ago.    Allergies  Allergen Reactions  . Ciprofloxacin     Other reaction(s): Joint Pains  . Tetanus Toxoids Hives     Current Outpatient Medications:  .  azelastine (ASTELIN) 0.1 % nasal spray, Place 2 sprays into both nostrils 2 (two) times daily. Use in each nostril as directed, Disp: , Rfl:  .  Cholecalciferol 1000 UNITS capsule, Take 1,000 Units by mouth daily. , Disp: , Rfl:  .  clonazePAM (KLONOPIN) 0.25 MG disintegrating tablet, Take 0.25 mg by mouth daily. , Disp: , Rfl: 1 .  CYANOCOBALAMIN PO, Take 1 tablet by mouth daily. , Disp: , Rfl:  .  DULoxetine (CYMBALTA) 30 MG capsule, Take 30 mg by mouth daily., Disp: , Rfl:  .  DULoxetine (CYMBALTA) 60 MG capsule, Take 60 mg by mouth daily. , Disp: , Rfl:  .  Flaxseed, Linseed, (FLAX SEEDS PO), Take 1 capsule by mouth daily., Disp: , Rfl:  .  gabapentin (NEURONTIN) 300 MG capsule, 3 times as needed, Disp: , Rfl:  .  HYDROcodone-acetaminophen (NORCO) 10-325 MG tablet, Take 1 tablet by mouth every 4 (four) hours as needed., Disp: 180 tablet, Rfl: 0 .  lamoTRIgine (LAMICTAL) 200 MG tablet, Take 200 mg by mouth daily., Disp: , Rfl: 1 .  lisdexamfetamine (VYVANSE) 50 MG capsule, Take 1 capsule (50 mg total) by mouth daily., Disp: 30 capsule, Rfl: 0 .  MULTIPLE VITAMINS-MINERALS PO, Take 1 tablet by mouth daily. , Disp: , Rfl:  .  Omega-3 Fatty Acids (FISH OIL) 1200 MG CAPS, Take 1 capsule by mouth daily.  , Disp: , Rfl:  .  polyethylene glycol powder (MIRALAX) powder, Take by mouth daily. , Disp: , Rfl:  .  promethazine (PHENERGAN) 25 MG tablet, TAKE 1 TABLET (25 MG TOTAL) BY MOUTH EVERY 6 (SIX) HOURS AS NEEDED FOR NAUSEA., Disp: , Rfl: 0 .  sertraline (ZOLOFT) 100 MG tablet, Take 100 mg by mouth 2 (two) times daily. , Disp: , Rfl:  .  SUMAtriptan (IMITREX) 100 MG tablet, TAKE 1 AT HEADACHE ONSET, MAY REPEAT AFTER 2 HOURS, NO MORE THAN 2 IN A DAY,NO MORE THAN 4 IN A WEEK, Disp: , Rfl:  .  Tetrahydrozoline HCl (EYE DROPS OP), Apply 1 drop to eye 2 (two) times daily. , Disp: , Rfl:  .  topiramate (TOPAMAX) 25 MG tablet, Take 25 mg by mouth daily. 1 tab;et in the morning, and 4 tablets at bedtime, Disp: , Rfl:  .  traZODone (DESYREL) 50 MG tablet, TAKE 1 TO 2 TABLETS BY MOUTH AT BEDTIME FOR SLEEP, Disp: , Rfl: 1  Review of Systems  Constitutional: Negative.   Respiratory: Negative.   Cardiovascular: Negative.   Gastrointestinal: Negative.   Musculoskeletal: Positive for myalgias.  Skin: Negative.  Negative for wound.  Neurological: Negative.     Social History   Tobacco Use  . Smoking status:  Never Smoker  . Smokeless tobacco: Never Used  Substance Use Topics  . Alcohol use: No    Alcohol/week: 0.0 oz   Objective:   BP 110/80 (BP Location: Left Arm, Patient Position: Sitting, Cuff Size: Normal)   Pulse (!) 109   Temp 98 F (36.7 C) (Oral)   Resp 16   Wt 182 lb (82.6 kg)   SpO2 96%   BMI 31.24 kg/m  Vitals:   01/23/18 1118  BP: 110/80  Pulse: (!) 109  Resp: 16  Temp: 98 F (36.7 C)  TempSrc: Oral  SpO2: 96%  Weight: 182 lb (82.6 kg)     Physical Exam  Constitutional: She appears well-developed and well-nourished. No distress.  HENT:  Right Ear: Hearing, tympanic membrane, external ear and ear canal normal.  Left Ear: Hearing, tympanic membrane, external ear and ear canal normal.  Neck: Normal range of motion. Neck supple. No JVD present. No tracheal deviation  present. No thyromegaly present.  Cardiovascular: Normal rate, regular rhythm and normal heart sounds. Exam reveals no gallop and no friction rub.  No murmur heard. Pulmonary/Chest: Effort normal and breath sounds normal. No respiratory distress. She has no wheezes. She has no rales. She exhibits tenderness. She exhibits no crepitus, no deformity, no swelling and no retraction.    Lymphadenopathy:    She has no cervical adenopathy.  Skin: She is not diaphoretic.  Vitals reviewed.      Assessment & Plan:     1. Fall, initial encounter Will get imaging of ribs on left side as below to r/o fracture. Discussed conservative therapy. Patient has Norco on hand for chronic pain. She has been using this with relief. May alternate with IBU 600-800mg . Heating pad. Bracing and deep breathing exercises demonstrated. I will f/u pending results.  - DG Ribs Unilateral Left; Future  2. Rib pain on left side See above medical treatment plan. - DG Ribs Unilateral Left; Future  3. Muscle spasm Gets muscle spasms intermittently in back. Most often aggravated when she does certain exercises. Baclofen given as below for prn use. Drowsiness precautions advised.  - baclofen (LIORESAL) 20 MG tablet; Take 1 tablet (20 mg total) by mouth 3 (three) times daily.  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 Discussed vestibular rehab but patient desires to hold off on this at this time.        Mar Daring, PA-C  [facility]

## 2018-01-26 ENCOUNTER — Telehealth: Payer: Self-pay

## 2018-01-26 NOTE — Telephone Encounter (Signed)
Informed patient of xray results

## 2018-01-27 ENCOUNTER — Encounter: Payer: Self-pay | Admitting: Emergency Medicine

## 2018-01-27 ENCOUNTER — Other Ambulatory Visit: Payer: Self-pay

## 2018-01-27 ENCOUNTER — Observation Stay: Payer: Medicare HMO

## 2018-01-27 ENCOUNTER — Observation Stay
Admission: EM | Admit: 2018-01-27 | Discharge: 2018-01-28 | Disposition: A | Discharge status: Home / Self Care | Payer: Medicare HMO | Attending: Internal Medicine | Admitting: Internal Medicine

## 2018-01-27 ENCOUNTER — Observation Stay (HOSPITAL_BASED_OUTPATIENT_CLINIC_OR_DEPARTMENT_OTHER)
Admit: 2018-01-27 | Discharge: 2018-01-27 | Disposition: A | Discharge status: Home / Self Care | Payer: Medicare HMO | Attending: Internal Medicine | Admitting: Internal Medicine

## 2018-01-27 ENCOUNTER — Emergency Department: Payer: Medicare HMO

## 2018-01-27 DIAGNOSIS — R29898 Other symptoms and signs involving the musculoskeletal system: Principal | ICD-10-CM | POA: Insufficient documentation

## 2018-01-27 DIAGNOSIS — F419 Anxiety disorder, unspecified: Secondary | ICD-10-CM | POA: Insufficient documentation

## 2018-01-27 DIAGNOSIS — Z7982 Long term (current) use of aspirin: Secondary | ICD-10-CM | POA: Diagnosis not present

## 2018-01-27 DIAGNOSIS — F329 Major depressive disorder, single episode, unspecified: Secondary | ICD-10-CM | POA: Insufficient documentation

## 2018-01-27 DIAGNOSIS — R0989 Other specified symptoms and signs involving the circulatory and respiratory systems: Secondary | ICD-10-CM

## 2018-01-27 DIAGNOSIS — R110 Nausea: Secondary | ICD-10-CM | POA: Insufficient documentation

## 2018-01-27 DIAGNOSIS — R202 Paresthesia of skin: Secondary | ICD-10-CM | POA: Diagnosis not present

## 2018-01-27 DIAGNOSIS — G43909 Migraine, unspecified, not intractable, without status migrainosus: Secondary | ICD-10-CM | POA: Insufficient documentation

## 2018-01-27 DIAGNOSIS — G459 Transient cerebral ischemic attack, unspecified: Secondary | ICD-10-CM | POA: Diagnosis not present

## 2018-01-27 DIAGNOSIS — Z79899 Other long term (current) drug therapy: Secondary | ICD-10-CM | POA: Diagnosis not present

## 2018-01-27 DIAGNOSIS — E785 Hyperlipidemia, unspecified: Secondary | ICD-10-CM | POA: Insufficient documentation

## 2018-01-27 DIAGNOSIS — R635 Abnormal weight gain: Secondary | ICD-10-CM | POA: Diagnosis not present

## 2018-01-27 DIAGNOSIS — Z85828 Personal history of other malignant neoplasm of skin: Secondary | ICD-10-CM | POA: Insufficient documentation

## 2018-01-27 DIAGNOSIS — S0990XA Unspecified injury of head, initial encounter: Secondary | ICD-10-CM | POA: Diagnosis not present

## 2018-01-27 DIAGNOSIS — I5030 Unspecified diastolic (congestive) heart failure: Secondary | ICD-10-CM

## 2018-01-27 DIAGNOSIS — M4802 Spinal stenosis, cervical region: Secondary | ICD-10-CM | POA: Diagnosis not present

## 2018-01-27 DIAGNOSIS — R69 Illness, unspecified: Secondary | ICD-10-CM | POA: Diagnosis not present

## 2018-01-27 DIAGNOSIS — M50223 Other cervical disc displacement at C6-C7 level: Secondary | ICD-10-CM | POA: Diagnosis not present

## 2018-01-27 LAB — COMPREHENSIVE METABOLIC PANEL
ALT: 10 U/L — AB (ref 14–54)
AST: 26 U/L (ref 15–41)
Albumin: 4.2 g/dL (ref 3.5–5.0)
Alkaline Phosphatase: 84 U/L (ref 38–126)
Anion gap: 6 (ref 5–15)
BILIRUBIN TOTAL: 1 mg/dL (ref 0.3–1.2)
BUN: 19 mg/dL (ref 6–20)
CO2: 25 mmol/L (ref 22–32)
CREATININE: 0.72 mg/dL (ref 0.44–1.00)
Calcium: 8.9 mg/dL (ref 8.9–10.3)
Chloride: 109 mmol/L (ref 101–111)
GFR calc Af Amer: >60 mL/min (ref >60)
Glucose, Bld: 130 mg/dL — ABNORMAL HIGH (ref 65–99)
Potassium: 3.7 mmol/L (ref 3.5–5.1)
Sodium: 140 mmol/L (ref 135–145)
TOTAL PROTEIN: 7 g/dL (ref 6.5–8.1)

## 2018-01-27 LAB — DIFFERENTIAL
BASOS ABS: 0.1 10*3/uL (ref 0–0.1)
BASOS PCT: 1 %
EOS ABS: 0 10*3/uL (ref 0–0.7)
Eosinophils Relative: 0 %
Lymphocytes Relative: 14 %
Lymphs Abs: 1.2 10*3/uL (ref 1.0–3.6)
MONOS PCT: 7 %
Monocytes Absolute: 0.6 10*3/uL (ref 0.2–0.9)
NEUTROS PCT: 78 %
Neutro Abs: 6.6 10*3/uL — ABNORMAL HIGH (ref 1.4–6.5)

## 2018-01-27 LAB — PROTIME-INR
INR: 0.93
Prothrombin Time: 12.4 seconds (ref 11.4–15.2)

## 2018-01-27 LAB — CBC
HCT: 35.5 % (ref 35.0–47.0)
Hemoglobin: 12.2 g/dL (ref 12.0–16.0)
MCH: 30.6 pg (ref 26.0–34.0)
MCHC: 34.3 g/dL (ref 32.0–36.0)
MCV: 89 fL (ref 80.0–100.0)
Platelets: 242 10*3/uL (ref 150–440)
RBC: 3.99 MIL/uL (ref 3.80–5.20)
RDW: 13.7 % (ref 11.5–14.5)
WBC: 8.5 10*3/uL (ref 3.6–11.0)

## 2018-01-27 LAB — LIPID PANEL
CHOLESTEROL: 203 mg/dL — AB (ref 0–200)
HDL: 54 mg/dL (ref >40)
LDL CALC: 134 mg/dL — AB (ref 0–99)
TRIGLYCERIDES: 76 mg/dL (ref <150)
Total CHOL/HDL Ratio: 3.8 RATIO
VLDL: 15 mg/dL (ref 0–40)

## 2018-01-27 LAB — APTT: APTT: 26 s (ref 24–36)

## 2018-01-27 LAB — TROPONIN I

## 2018-01-27 MED ORDER — HEPARIN SODIUM (PORCINE) 5000 UNIT/ML IJ SOLN
5000.0000 [IU] | Freq: Three times a day (TID) | INTRAMUSCULAR | Status: DC
  Administered 2018-01-28 (×2): 5000 [IU] via SUBCUTANEOUS
  Filled 2018-01-27 (×3): qty 1

## 2018-01-27 MED ORDER — TETRAHYDROZOLINE HCL 0.05 % OP SOLN
1.0000 [drp] | Freq: Two times a day (BID) | OPHTHALMIC | Status: DC
  Filled 2018-01-27: qty 15

## 2018-01-27 MED ORDER — ACETAMINOPHEN 325 MG PO TABS: 650.0000 mg | ORAL_TABLET | ORAL | Status: DC | PRN

## 2018-01-27 MED ORDER — ONDANSETRON HCL 4 MG/2ML IJ SOLN
INTRAMUSCULAR | Status: AC
  Filled 2018-01-27: qty 2

## 2018-01-27 MED ORDER — TOPIRAMATE 100 MG PO TABS
100.0000 mg | ORAL_TABLET | Freq: Every day | ORAL | Status: DC
  Administered 2018-01-28: 100 mg via ORAL
  Filled 2018-01-27 (×2): qty 1

## 2018-01-27 MED ORDER — GABAPENTIN 300 MG PO CAPS
300.0000 mg | ORAL_CAPSULE | Freq: Two times a day (BID) | ORAL | Status: DC
  Administered 2018-01-28 (×2): 300 mg via ORAL
  Filled 2018-01-27 (×2): qty 1

## 2018-01-27 MED ORDER — BACLOFEN 10 MG PO TABS
20.0000 mg | ORAL_TABLET | Freq: Three times a day (TID) | ORAL | Status: DC
  Administered 2018-01-28 (×2): 20 mg via ORAL
  Filled 2018-01-27 (×5): qty 2

## 2018-01-27 MED ORDER — HYDROMORPHONE HCL 1 MG/ML IJ SOLN
1.0000 mg | Freq: Once | INTRAMUSCULAR | Status: AC
  Administered 2018-01-27: 1 mg via INTRAVENOUS
  Filled 2018-01-27: qty 1

## 2018-01-27 MED ORDER — SENNOSIDES-DOCUSATE SODIUM 8.6-50 MG PO TABS: 1.0000 | ORAL_TABLET | Freq: Every evening | ORAL | Status: DC | PRN

## 2018-01-27 MED ORDER — ATORVASTATIN CALCIUM 20 MG PO TABS
40.0000 mg | ORAL_TABLET | Freq: Every day | ORAL | Status: DC
  Administered 2018-01-28: 01:00:00 40 mg via ORAL
  Filled 2018-01-27: qty 2

## 2018-01-27 MED ORDER — LISDEXAMFETAMINE DIMESYLATE 50 MG PO CAPS: 50.0000 mg | ORAL_CAPSULE | Freq: Every day | ORAL | Status: DC

## 2018-01-27 MED ORDER — ACETAMINOPHEN 650 MG RE SUPP: 650.0000 mg | RECTAL | Status: DC | PRN

## 2018-01-27 MED ORDER — ASPIRIN 81 MG PO CHEW
324.0000 mg | CHEWABLE_TABLET | Freq: Once | ORAL | Status: AC
  Administered 2018-01-27: 324 mg via ORAL
  Filled 2018-01-27: qty 4

## 2018-01-27 MED ORDER — ONDANSETRON HCL 4 MG/2ML IJ SOLN
4.0000 mg | Freq: Once | INTRAMUSCULAR | Status: AC
  Administered 2018-01-27: 4 mg via INTRAVENOUS
  Filled 2018-01-27: qty 2

## 2018-01-27 MED ORDER — ONDANSETRON HCL 4 MG/2ML IJ SOLN
4.0000 mg | Freq: Four times a day (QID) | INTRAMUSCULAR | Status: DC | PRN
  Administered 2018-01-27 – 2018-01-28 (×3): 4 mg via INTRAVENOUS
  Filled 2018-01-27 (×3): qty 2

## 2018-01-27 MED ORDER — SUMATRIPTAN SUCCINATE 50 MG PO TABS
50.0000 mg | ORAL_TABLET | Freq: Three times a day (TID) | ORAL | Status: DC | PRN
  Administered 2018-01-28: 17:00:00 50 mg via ORAL
  Filled 2018-01-27: qty 1

## 2018-01-27 MED ORDER — SERTRALINE HCL 50 MG PO TABS
100.0000 mg | ORAL_TABLET | Freq: Two times a day (BID) | ORAL | Status: DC
  Administered 2018-01-28 (×2): 100 mg via ORAL
  Filled 2018-01-27 (×2): qty 2

## 2018-01-27 MED ORDER — ONDANSETRON HCL 4 MG/2ML IJ SOLN
4.0000 mg | Freq: Once | INTRAMUSCULAR | Status: AC
  Administered 2018-01-27: 4 mg via INTRAVENOUS

## 2018-01-27 MED ORDER — HYDROCODONE-ACETAMINOPHEN 10-325 MG PO TABS
1.0000 | ORAL_TABLET | Freq: Four times a day (QID) | ORAL | Status: DC | PRN
  Filled 2018-01-27 (×2): qty 1

## 2018-01-27 MED ORDER — LAMOTRIGINE 100 MG PO TABS
200.0000 mg | ORAL_TABLET | Freq: Every day | ORAL | Status: DC
  Administered 2018-01-28 (×2): 200 mg via ORAL
  Filled 2018-01-27 (×2): qty 2

## 2018-01-27 MED ORDER — VITAMIN D 1000 UNITS PO TABS
1000.0000 [IU] | ORAL_TABLET | Freq: Every day | ORAL | Status: DC
  Administered 2018-01-28: 11:00:00 1000 [IU] via ORAL
  Filled 2018-01-27: qty 1

## 2018-01-27 MED ORDER — TOPIRAMATE 25 MG PO TABS
25.0000 mg | ORAL_TABLET | Freq: Every morning | ORAL | Status: DC
  Administered 2018-01-28: 25 mg via ORAL
  Filled 2018-01-27: qty 1

## 2018-01-27 MED ORDER — AZELASTINE HCL 0.1 % NA SOLN
2.0000 | Freq: Two times a day (BID) | NASAL | Status: DC
  Administered 2018-01-28 (×2): 2 via NASAL
  Filled 2018-01-27: qty 30

## 2018-01-27 MED ORDER — SODIUM CHLORIDE 0.9 % IV SOLN
INTRAVENOUS | Status: DC
  Administered 2018-01-27: 17:00:00 via INTRAVENOUS

## 2018-01-27 MED ORDER — OMEGA-3-ACID ETHYL ESTERS 1 G PO CAPS
1.0000 g | ORAL_CAPSULE | Freq: Every day | ORAL | Status: DC
  Administered 2018-01-28: 1 g via ORAL
  Filled 2018-01-27: qty 1

## 2018-01-27 MED ORDER — ACETAMINOPHEN 160 MG/5ML PO SOLN
650.0000 mg | ORAL | Status: DC | PRN
  Filled 2018-01-27: qty 20.3

## 2018-01-27 MED ORDER — DULOXETINE HCL 30 MG PO CPEP
30.0000 mg | ORAL_CAPSULE | Freq: Every day | ORAL | Status: DC
  Administered 2018-01-28 (×2): 30 mg via ORAL
  Filled 2018-01-27 (×2): qty 1

## 2018-01-27 MED ORDER — STROKE: EARLY STAGES OF RECOVERY BOOK
Freq: Once | Status: AC
  Administered 2018-01-27: 17:00:00

## 2018-01-27 NOTE — ED Notes (Signed)
Patient fell a week ago and has contused ribs on left.  Pale and diaphoretic.  Speech clear alert and oriented,

## 2018-01-27 NOTE — ED Triage Notes (Signed)
Patient complaining of right arm "strange feeling in right arm".  Describes it as tingling.  Nausea without vomiting.  States she feels light headed.  Started this AM, took an ASA.  States she started new medicine last PM.

## 2018-01-27 NOTE — Care Management Obs Status (Signed)
[facility] NOTIFICATION   Patient Details  Name: REGLA FITZGIBBON MRN: [id number] Date of Birth: 02-21-1961   Medicare Observation Status Notification Given:  Yes    Marshell Garfinkel, RN 01/27/2018, 1:59 PM

## 2018-01-27 NOTE — ED Provider Notes (Addendum)
[facility] Emergency Department Provider Note  ____________________________________________  Time seen: Approximately 12:34 PM  I have reviewed the triage vital signs and the nursing notes.   HISTORY  Chief Complaint No chief complaint on file.    HPI Krystal Simpson is a 57 y.o. female with a history of obesity and hyperlipidemia who complains of right arm paresthesias that was present when she woke up this morning at 6:30 AM. At bedtime last night at about 10 PM she felt normal. Never had anything like this before. Denies falls or head injury or vision changes. Denies motor weakness. Symptoms are constant, no aggravating or alleviating factors. Nonradiating.      Past Medical History:  Diagnosis Date  . Abnormal Pap smear of cervix   . Anxiety   . 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 Depression   . History of kidney stones      Patient Active Problem List   Diagnosis Date Noted  . Abnormal Pap smear of cervix 10/09/2017  . Sacroiliac joint dysfunction 01/09/2017  . BPPV (benign paroxysmal positional vertigo) 11/14/2015  . Abnormal weight loss 10/04/2015  . Back ache 08/12/2015  . Right leg pain 08/07/2015  . Sciatica of right side 07/19/2015  . Low back pain 06/27/2015  . Abnormal thyroid stimulating hormone (TSH) level 06/01/2015  . Absolute anemia 06/01/2015  . Anxiety 06/01/2015  . Basal cell carcinoma 06/01/2015  . Colon polyp 06/01/2015  . Clinical depression 06/01/2015  . Esophageal reflux 06/01/2015  . Headache, migraine 06/01/2015  . Adiposity 06/01/2015  . Hypercholesterolemia without hypertriglyceridemia 06/01/2015  . Squamous cell carcinoma 06/01/2015  . Avitaminosis D 06/01/2015  . Achilles tendonitis, bilateral 10/12/2014  . Disordered sleep 03/03/2014  . Sleep disorder 03/03/2014  . Obesity, unspecified 03/03/2014     Past Surgical History:  Procedure Laterality Date  .  BASAL CELL CARCINOMA EXCISION  2014  . BASAL CELL CARCINOMA EXCISION  09/20/2015   Dr. Evorn Gong, left mid forearm  . MOHS SURGERY  2015   top of head Dr. Shelda Jakes at [facility]  . SQUAMOUS CELL CARCINOMA EXCISION  2014     Prior to Admission medications   Medication Sig Start Date End Date Taking? Authorizing Provider  azelastine (ASTELIN) 0.1 % nasal spray Place 2 sprays into both nostrils 2 (two) times daily. Use in each nostril as directed    [provider]  baclofen (LIORESAL) 20 MG tablet Take 1 tablet (20 mg total) by mouth 3 (three) times daily. 01/23/18   Mar Daring, PA-C  Cholecalciferol 1000 UNITS capsule Take 1,000 Units by mouth daily.     [provider]  clonazePAM (KLONOPIN) 0.25 MG disintegrating tablet Take 0.25 mg by mouth daily.  12/08/15   [provider]  CYANOCOBALAMIN PO Take 1 tablet by mouth daily.     [provider]  DULoxetine (CYMBALTA) 30 MG capsule Take 30 mg by mouth daily.    [provider]  Flaxseed, Linseed, (FLAX SEEDS PO) Take 1 capsule by mouth daily.    [provider]  gabapentin (NEURONTIN) 300 MG capsule 3 times as needed 09/28/15   [provider]  HYDROcodone-acetaminophen (NORCO) 10-325 MG tablet Take 1 tablet by mouth every 4 (four) hours as needed. 12/19/17   Mar Daring, PA-C  lamoTRIgine (LAMICTAL) 200 MG tablet Take 200 mg by mouth daily. 01/06/18   [provider]  lisdexamfetamine (VYVANSE) 50 MG capsule  Take 1 capsule (50 mg total) by mouth daily. 10/09/17   Mar Daring, PA-C  MULTIPLE VITAMINS-MINERALS PO Take 1 tablet by mouth daily.     [provider]  Omega-3 Fatty Acids (FISH OIL) 1200 MG CAPS Take 1 capsule by mouth daily.     [provider]  polyethylene glycol powder (MIRALAX) powder Take by mouth daily.  04/11/16   [provider]  promethazine (PHENERGAN) 25 MG tablet TAKE 1 TABLET (25 MG TOTAL) BY MOUTH EVERY 6 (SIX)  HOURS AS NEEDED FOR NAUSEA. 01/23/15   [provider]  sertraline (ZOLOFT) 100 MG tablet Take 100 mg by mouth 2 (two) times daily.     [provider]  SUMAtriptan (IMITREX) 100 MG tablet TAKE 1 AT HEADACHE ONSET, MAY REPEAT AFTER 2 HOURS, NO MORE THAN 2 IN A DAY,NO MORE THAN 4 IN A WEEK 08/22/14   [provider]  Tetrahydrozoline HCl (EYE DROPS OP) Apply 1 drop to eye 2 (two) times daily.     [provider]  topiramate (TOPAMAX) 25 MG tablet Take 25 mg by mouth daily. 1 tab;et in the morning, and 4 tablets at bedtime    [provider]  traZODone (DESYREL) 50 MG tablet TAKE 1 TO 2 TABLETS BY MOUTH AT BEDTIME FOR SLEEP 01/03/18   [provider]     Allergies Ciprofloxacin and Tetanus toxoids   Family History  Problem Relation Age of Onset  . Emphysema Mother   . Lung cancer Father   . Healthy Sister   . Heart attack Sister 47  . Heart attack Maternal Grandmother   . Breast cancer Sister     Social History Social History   Tobacco Use  . Smoking status: Never Smoker  . Smokeless tobacco: Never Used  Substance Use Topics  . Alcohol use: No    Alcohol/week: 0.0 oz  . Drug use: No    Review of Systems  Constitutional:   No fever or chills.  ENT:   No sore throat. No rhinorrhea. Cardiovascular:   No chest pain or syncope. Respiratory:   No dyspnea or cough. Gastrointestinal:   Negative for abdominal pain, vomiting and diarrhea.  Musculoskeletal:   Negative for focal pain or swelling All other systems reviewed and are negative except as documented above in ROS and HPI.  ____________________________________________   PHYSICAL EXAM:  VITAL SIGNS: ED Triage Vitals  Enc Vitals Group     BP 01/27/18 0953 121/68     Pulse Rate 01/27/18 0953 70     Resp 01/27/18 1100 14     Temp 01/27/18 0953 97.7 F (36.5 C)     Temp Source 01/27/18 0953 Oral     SpO2 01/27/18 0953 100 %     Weight 01/27/18 0955 180 lb (81.6 kg)      Height 01/27/18 0955 5\' 4"  (1.626 m)     Head Circumference --      Peak Flow --      Pain Score 01/27/18 0954 8     Pain Loc --      Pain Edu? --      Excl. in [place]? --     Vital signs reviewed, nursing assessments reviewed.   Constitutional:   Alert and oriented. Well appearing and in no distress. Eyes:   Conjunctivae are normal. EOMI. PERRL. ENT      Head:   Normocephalic and atraumatic.      Nose:   No congestion/rhinnorhea.  Mouth/Throat:   MMM, no pharyngeal erythema. No peritonsillar mass.       Neck:   No meningismus. Full ROM. Hematological/Lymphatic/Immunilogical:   No cervical lymphadenopathy. Cardiovascular:   RRR. Symmetric bilateral radial and DP pulses.  No murmurs.  Respiratory:   Normal respiratory effort without tachypnea/retractions. Breath sounds are clear and equal bilaterally. No wheezes/rales/rhonchi. Gastrointestinal:   Soft and nontender. Non distended. There is no CVA tenderness.  No rebound, rigidity, or guarding. Genitourinary:   deferred Musculoskeletal:   Normal range of motion in all extremities. No joint effusions.  No lower extremity tenderness.  No edema. Neurologic:   Normal speech and language.  Motor grossly intact. cranial nerves II through XII intact Subjectively diminished sensation in the right arm Positive right sided drift NIH stroke scale equals 2  Skin:    Skin is warm, dry and intact. No rash noted.  No petechiae, purpura, or bullae.  ____________________________________________    LABS (pertinent positives/negatives) (all labs ordered are listed, but only abnormal results are displayed) Labs Reviewed  COMPREHENSIVE METABOLIC PANEL - Abnormal; Notable for the following components:      Result Value   Glucose, Bld 130 (*)    ALT 10 (*)    All other components within normal limits  DIFFERENTIAL - Abnormal; Notable for the following components:   Neutro Abs 6.6 (*)    All other components within normal limits   PROTIME-INR  TROPONIN I  CBC  PROTIME-INR  APTT  CBG MONITORING, ED  POC URINE PREG, ED   ____________________________________________   EKG  EKG interpreted by me Normal sinus rhythm, rate of 68. Normal axis, intervals, QRS, ST segments, and T waves.  ____________________________________________    RADIOLOGY  Ct Head Code Stroke Wo Contrast  Result Date: 01/27/2018 CLINICAL DATA:  Code stroke. Right arm tingling. Recent fall. Lightheadedness and nausea. EXAM: CT HEAD WITHOUT CONTRAST TECHNIQUE: Contiguous axial images were obtained from the base of the skull through the vertex without intravenous contrast. COMPARISON:  Brain MRI 01/15/2017 FINDINGS: Brain: There is no evidence of acute infarct, intracranial hemorrhage, mass, midline shift, or extra-axial fluid collection. The ventricles and sulci are within normal limits for age. Vascular: No hyperdense vessel. Skull: No fracture or destructive osseous process. Sinuses/Orbits: Visualized paranasal sinuses and mastoid air cells are clear. Visualized orbits are unremarkable. Other: None. ASPECTS [facility] Stroke Program Early CT Score) - Ganglionic level infarction (caudate, lentiform nuclei, internal capsule, insula, M1-M3 cortex): 7 - Supraganglionic infarction (M4-M6 cortex): 3 Total score (0-10 with 10 being normal): 10 IMPRESSION: 1. Negative head CT. 2. ASPECTS is 10. These results were called by telephone at the time of interpretation on 01/27/2018 at 10:24 am to Dr. Burlene Arnt, who verbally acknowledged these results. Electronically Signed   By: Logan Bores M.D.   On: 01/27/2018 10:25    ____________________________________________   PROCEDURES Procedures  ____________________________________________    CLINICAL IMPRESSION / ASSESSMENT AND PLAN / ED COURSE  Pertinent labs & imaging results that were available during my care of the patient were reviewed by me and considered in my medical decision making (see chart for  details).      Clinical Course as of Jan 27 1237  Tue Jan 27, 2018  1006 RUE drift. Otherwise sx are subjective but not reproduced on exam. Will obtain CT head and plan for neuro eval. LKW 12 hours ago, VAN negative; not a code stroke at this time.    [PS]  1024 D/w Neuro Dr. Doy Mince. Agrees with stroke  work up give RUE drift. Plan to hospitalize after initial results. Will give aspirin.    [PS]  1100 CT head negative. Labs pending.   [PS]  1237 Dilaudid given for her subacute to chronic back pain and left chest wall pain that she has had a previous fall. She is taking oxycodone 10-20 mg by mouth at a time for this pain. Last took oxycodone last night prior to bedtime.   [PS]    Clinical Course User Index [PS] Carrie Mew, MD     ----------------------------------------- 12:36 PM on 01/27/2018 -----------------------------------------  Labs unremarkable. Case discussed with the hospitalist for further management. Aspirin given.  ____________________________________________   FINAL CLINICAL IMPRESSION(S) / ED DIAGNOSES    Final diagnoses:  Paresthesia of right upper extremity  Suspected stroke patient last known to be well more than 2 hours ago     ED Discharge Orders    None      Portions of this note were generated with dragon dictation software. Dictation errors may occur despite best attempts at proofreading.    Carrie Mew, MD 01/27/18 [place]    Carrie Mew, MD 02/02/18 [id number]

## 2018-01-27 NOTE — ED Notes (Signed)
Pt requesting nausea medication before going to MRI.  Dr. Joni Fears notified and VO given.

## 2018-01-27 NOTE — ED Notes (Signed)
Patient to ED via [facility], examined by Dr. Joni Fears.  Code Stroke called.   To CT via WC with Lorrie RN then to room 12 after CT.

## 2018-01-27 NOTE — Consult Note (Addendum)
Referring Physician: Joni Fears    Chief Complaint: Right arm numbness and pain  HPI: Krystal Simpson is an 57 y.o. female who reports awakening this morning and feeling that her right arm was numb and painful.  At times felt the pain radiate to her chest.  Had nausea with the pain.  Arm is numb and heavy feeling in all of the fingers and to the upper arm but no including the shoulder.  There is no limitation of ROM. Patient had a fall about a week ago breaking ribs but did not hit on the tight side.   Initial NIHSS of 1.    Date last known well: Date: 01/26/2018 Time last known well: Time: 22:00 tPA Given: No: Outside time window  Past Medical History:  Diagnosis Date  . Abnormal Pap smear of cervix   . Anxiety   . 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 Depression   . History of kidney stones     Past Surgical History:  Procedure Laterality Date  . BASAL CELL CARCINOMA EXCISION  2014  . BASAL CELL CARCINOMA EXCISION  09/20/2015   Dr. Evorn Gong, left mid forearm  . MOHS SURGERY  2015   top of head Dr. Shelda Jakes at [facility]  . SQUAMOUS CELL CARCINOMA EXCISION  2014    Family History  Problem Relation Age of Onset  . Emphysema Mother   . Lung cancer Father   . Healthy Sister   . Heart attack Sister 66  . Heart attack Maternal Grandmother   . Breast cancer Sister    Social History:  reports that she has never smoked. She has never used smokeless tobacco. She reports that she does not drink alcohol or use drugs.  Allergies:  Allergies  Allergen Reactions  . Ciprofloxacin     Other reaction(s): Joint Pains  . Tetanus Toxoids Hives    Medications: I have reviewed the patient's current medications. Prior to Admission:  Prior to Admission medications   Medication Sig Start Date End Date Taking? Authorizing Provider  aspirin EC 81 MG tablet Take 81 mg by mouth once.   Yes [provider]  azelastine (ASTELIN) 0.1 % nasal spray  Place 2 sprays into both nostrils 2 (two) times daily. Use in each nostril as directed   Yes [provider]  baclofen (LIORESAL) 20 MG tablet Take 1 tablet (20 mg total) by mouth 3 (three) times daily. 01/23/18  Yes Mar Daring, PA-C  Cholecalciferol 1000 UNITS capsule Take 1,000 Units by mouth daily.    Yes [provider]  clonazePAM (KLONOPIN) 0.25 MG disintegrating tablet Take 0.25 mg by mouth daily.  12/08/15  Yes [provider]  cyanocobalamin 1000 MCG tablet Take 1,000 mcg by mouth daily.    Yes [provider]  DULoxetine (CYMBALTA) 30 MG capsule Take 30 mg by mouth daily.   Yes [provider]  Flaxseed, Linseed, (FLAX SEEDS PO) Take 1 capsule by mouth daily.   Yes [provider]  HYDROcodone-acetaminophen (NORCO) 10-325 MG tablet Take 1 tablet by mouth every 4 (four) hours as needed. 12/19/17  Yes Mar Daring, PA-C  lamoTRIgine (LAMICTAL) 200 MG tablet Take 200 mg by mouth daily. 01/06/18  Yes [provider]  lisdexamfetamine (VYVANSE) 50 MG capsule Take 1 capsule (50 mg total) by mouth daily. 10/09/17  Yes Mar Daring, PA-C  MULTIPLE VITAMINS-MINERALS PO Take 1 tablet by mouth daily.  Yes [provider]  Omega-3 Fatty Acids (FISH OIL) 1000 MG CAPS Take 1 capsule by mouth daily.    Yes [provider]  Polyethylene Glycol 3350 (MIRALAX PO) Take 17 g by mouth daily.  04/11/16  Yes [provider]  promethazine (PHENERGAN) 25 MG tablet TAKE 1 TABLET (25 MG TOTAL) BY MOUTH EVERY 6 (SIX) HOURS AS NEEDED FOR NAUSEA. 01/23/15  Yes [provider]  sertraline (ZOLOFT) 100 MG tablet Take 100 mg by mouth 2 (two) times daily.    Yes [provider]  SUMAtriptan (IMITREX) 100 MG tablet TAKE 1 AT HEADACHE ONSET, MAY REPEAT AFTER 2 HOURS, NO MORE THAN 2 IN A DAY,NO MORE THAN 4 IN A WEEK 08/22/14  Yes [provider]  Tetrahydrozoline HCl (EYE DROPS OP) Apply 1  drop to eye 2 (two) times daily.    Yes [provider]  topiramate (TOPAMAX) 25 MG tablet Take 20-100 mg by mouth 2 (two) times daily. Take 25 mg by mouth in the morning and 100 mg by mouth at bedtime.   Yes [provider]  traZODone (DESYREL) 50 MG tablet Take 100 mg by mouth at bedtime as needed for sleep. 01/03/18  Yes [provider]    ROS: History obtained from the patient  General ROS: negative for - chills, fatigue, fever, night sweats, weight gain or weight loss Psychological ROS: negative for - behavioral disorder, hallucinations, memory difficulties, mood swings or suicidal ideation Ophthalmic ROS: negative for - blurry vision, double vision, eye pain or loss of vision ENT ROS: negative for - epistaxis, nasal discharge, oral lesions, sore throat, tinnitus or vertigo Allergy and Immunology ROS: negative for - hives or itchy/watery eyes Hematological and Lymphatic ROS: negative for - bleeding problems, bruising or swollen lymph nodes Endocrine ROS: negative for - galactorrhea, hair pattern changes, polydipsia/polyuria or temperature intolerance Respiratory ROS: negative for - cough, hemoptysis, shortness of breath or wheezing Cardiovascular ROS: negative for - chest pain, dyspnea on exertion, edema or irregular heartbeat Gastrointestinal ROS: as noted in HPI Genito-Urinary ROS: negative for - dysuria, hematuria, incontinence or urinary frequency/urgency Musculoskeletal ROS: rib pain Neurological ROS: as noted in HPI Dermatological ROS: negative for rash and skin lesion changes  Physical Examination: Blood pressure 117/69, pulse 79, temperature 97.9 F (36.6 C), resp. rate 20, height 5\' 4"  (1.626 m), weight 81.6 kg (180 lb), SpO2 100 %.  HEENT-  Normocephalic, no lesions, without obvious abnormality.  Normal external eye and conjunctiva.  Normal TM's bilaterally.  Normal auditory canals and external ears. Normal external nose, mucus membranes and septum.   Normal pharynx. Cardiovascular- S1, S2 normal, pulses palpable throughout   Lungs- chest clear, no wheezing, rales, normal symmetric air entry Abdomen- soft, non-tender; bowel sounds normal; no masses,  no organomegaly Extremities- no edema Lymph-no adenopathy palpable Musculoskeletal-no joint tenderness, deformity or swelling Skin-warm and dry, no hyperpigmentation, vitiligo, or suspicious lesions  Neurological Examination   Mental Status: Alert, oriented, thought content appropriate.  Speech fluent without evidence of aphasia.  Able to follow 3 step commands without difficulty. Cranial Nerves: II: Discs flat bilaterally; Visual fields grossly normal, pupils equal, round, reactive to light and accommodation III,IV, VI: ptosis not present, extra-ocular motions intact bilaterally V,VII: smile symmetric, facial light touch sensation normal bilaterally VIII: hearing normal bilaterally IX,X: gag reflex present XI: bilateral shoulder shrug XII: midline tongue extension Motor: Right : Upper extremity   5/5    Left:     Upper extremity   5/5  Lower extremity   5/5     Lower extremity   5/5 Tone and bulk:normal tone throughout; no atrophy noted Sensory: Pinprick and light touch intact decreased on the RUE Deep Tendon Reflexes: 2+ and symmetric throughout Plantars: Right: downgoing   Left: downgoing Cerebellar: Normal finger-to-nose and normal heel-to-shin testing bilaterally   Laboratory Studies:  Basic Metabolic Panel: Recent Labs  Lab 01/27/18 1045  NA 140  K 3.7  CL 109  CO2 25  GLUCOSE 130*  BUN 19  CREATININE 0.72  CALCIUM 8.9    Liver Function Tests: Recent Labs  Lab 01/27/18 1045  AST 26  ALT 10*  ALKPHOS 84  BILITOT 1.0  PROT 7.0  ALBUMIN 4.2   No results for input(s): LIPASE, AMYLASE in the last 168 hours. No results for input(s): AMMONIA in the last 168 hours.  CBC: Recent Labs  Lab 01/27/18 1151  WBC 8.5  NEUTROABS 6.6*  HGB 12.2  HCT 35.5  MCV  89.0  PLT 242    Cardiac Enzymes: Recent Labs  Lab 01/27/18 1045  TROPONINI <0.03    BNP: Invalid input(s): POCBNP  CBG: No results for input(s): GLUCAP in the last 168 hours.  Microbiology: Results for orders placed or performed in visit on 10/30/15  Microscopic Examination     Status: None   Collection Time: 10/30/15  3:36 PM  Result Value Ref Range Status   WBC, UA 0-5 0 - 5 /hpf Final   RBC, UA None seen 0 - 2 /hpf Final   Epithelial Cells (non renal) 0-10 0 - 10 /hpf Final   Bacteria, UA None seen None seen/Few Final    Coagulation Studies: Recent Labs    01/27/18 1045 01/27/18 1151  LABPROT TEST WILL BE CREDITED 12.4  INR TEST WILL BE CREDITED 0.93    Urinalysis: No results for input(s): COLORURINE, LABSPEC, PHURINE, GLUCOSEU, HGBUR, BILIRUBINUR, KETONESUR, PROTEINUR, UROBILINOGEN, NITRITE, LEUKOCYTESUR in the last 168 hours.  Invalid input(s): APPERANCEUR  Lipid Panel:    Component Value Date/Time   CHOL 214 (H) 10/09/2017 1216   TRIG 60 10/09/2017 1216   HDL 71 10/09/2017 1216   CHOLHDL 3.0 10/09/2017 1216   LDLCALC 131 (H) 10/09/2017 1216    HgbA1C:  Lab Results  Component Value Date   HGBA1C 4.9 10/04/2016    Urine Drug Screen:  No results found for: LABOPIA, COCAINSCRNUR, LABBENZ, AMPHETMU, THCU, LABBARB  Alcohol Level: No results for input(s): ETH in the last 168 hours.  Other results: EKG: sinus rhythm at 68 bpm.  Imaging: Mr Virgel Paling IE Contrast  Result Date: 01/27/2018 CLINICAL DATA:  Obesity and hyperlipidemia. Right arm paresthesias. Symptoms began today. EXAM: MRI HEAD WITHOUT CONTRAST MRA HEAD WITHOUT CONTRAST TECHNIQUE: Multiplanar, multiecho pulse sequences of the brain and surrounding structures were obtained without intravenous contrast. Angiographic images of the head were obtained using MRA technique without contrast. COMPARISON:  Head CT same day.  MRI 01/15/2017. FINDINGS: MRI HEAD FINDINGS Brain: Diffusion imaging does not  show any acute or subacute infarction. The brainstem and cerebellum are normal. Cerebral hemispheres are normal except for a few scattered punctate foci of T2 and FLAIR signal within the hemispheric white matter. No cortical or large vessel territory infarction. No mass lesion, hemorrhage, hydrocephalus or extra-axial collection. Vascular: Major vessels at the base of the brain show flow. Skull and upper cervical spine: Negative Sinuses/Orbits: Clear/normal Other: No abnormality seen of the external auditory canal on the right. There was a question of an abnormality in  that location in 2017. MRA HEAD FINDINGS Both internal carotid arteries are widely patent into the brain. The anterior and middle cerebral vessels are normal without proximal stenosis, aneurysm or vascular malformation. Both vertebral arteries are widely patent to the basilar. No basilar stenosis. Posterior circulation branch vessels are normal. IMPRESSION: Normal intracranial MR angiography. No acute finding of the brain. No specific explanation of the clinical presentation. Few minimal punctate T2 and FLAIR foci in the cerebral hemispheric white matter could indicate the earliest manifestation of small vessel change. Electronically Signed   By: Nelson Chimes M.D.   On: 01/27/2018 14:33   Mr Brain Wo Contrast  Result Date: 01/27/2018 CLINICAL DATA:  Obesity and hyperlipidemia. Right arm paresthesias. Symptoms began today. EXAM: MRI HEAD WITHOUT CONTRAST MRA HEAD WITHOUT CONTRAST TECHNIQUE: Multiplanar, multiecho pulse sequences of the brain and surrounding structures were obtained without intravenous contrast. Angiographic images of the head were obtained using MRA technique without contrast. COMPARISON:  Head CT same day.  MRI 01/15/2017. FINDINGS: MRI HEAD FINDINGS Brain: Diffusion imaging does not show any acute or subacute infarction. The brainstem and cerebellum are normal. Cerebral hemispheres are normal except for a few scattered punctate  foci of T2 and FLAIR signal within the hemispheric white matter. No cortical or large vessel territory infarction. No mass lesion, hemorrhage, hydrocephalus or extra-axial collection. Vascular: Major vessels at the base of the brain show flow. Skull and upper cervical spine: Negative Sinuses/Orbits: Clear/normal Other: No abnormality seen of the external auditory canal on the right. There was a question of an abnormality in that location in 2017. MRA HEAD FINDINGS Both internal carotid arteries are widely patent into the brain. The anterior and middle cerebral vessels are normal without proximal stenosis, aneurysm or vascular malformation. Both vertebral arteries are widely patent to the basilar. No basilar stenosis. Posterior circulation branch vessels are normal. IMPRESSION: Normal intracranial MR angiography. No acute finding of the brain. No specific explanation of the clinical presentation. Few minimal punctate T2 and FLAIR foci in the cerebral hemispheric white matter could indicate the earliest manifestation of small vessel change. Electronically Signed   By: Nelson Chimes M.D.   On: 01/27/2018 14:33   Ct Head Code Stroke Wo Contrast  Result Date: 01/27/2018 CLINICAL DATA:  Code stroke. Right arm tingling. Recent fall. Lightheadedness and nausea. EXAM: CT HEAD WITHOUT CONTRAST TECHNIQUE: Contiguous axial images were obtained from the base of the skull through the vertex without intravenous contrast. COMPARISON:  Brain MRI 01/15/2017 FINDINGS: Brain: There is no evidence of acute infarct, intracranial hemorrhage, mass, midline shift, or extra-axial fluid collection. The ventricles and sulci are within normal limits for age. Vascular: No hyperdense vessel. Skull: No fracture or destructive osseous process. Sinuses/Orbits: Visualized paranasal sinuses and mastoid air cells are clear. Visualized orbits are unremarkable. Other: None. ASPECTS [facility] Stroke Program Early CT Score) - Ganglionic level infarction  (caudate, lentiform nuclei, internal capsule, insula, M1-M3 cortex): 7 - Supraganglionic infarction (M4-M6 cortex): 3 Total score (0-10 with 10 being normal): 10 IMPRESSION: 1. Negative head CT. 2. ASPECTS is 10. These results were called by telephone at the time of interpretation on 01/27/2018 at 10:24 am to Dr. Burlene Arnt, who verbally acknowledged these results. Electronically Signed   By: Logan Bores M.D.   On: 01/27/2018 10:25    Assessment: 57 y.o. female presenting with right arm numbness and pain.  MRI of the brain reviewed and shows no acute changes. MRA unremarkable.  Symptoms remain present making TIA less likely.  Patient  on ASA at home.  Would like to rule out cervical etiology.    Stroke Risk Factors - none  Plan: 1. MRI of cervical spine.  If unremarkable patient may follow up with PCP on an outpatient basis.   2. Continue ASA daily.    Case discussed with Dr. Judeth Horn, MD Neurology [phone] 01/27/2018, 3:08 PM

## 2018-01-27 NOTE — ED Notes (Signed)
First Nurse Note:  Per Dr. Joni Fears, do not call a Code Stroke since last known well was last PM.

## 2018-01-27 NOTE — ED Notes (Signed)
Patient transported to MRI 

## 2018-01-27 NOTE — H&P (Signed)
[facility] [place] NAME: Krystal Simpson    MR#:  [id number]  DATE OF BIRTH:  12-01-1960  DATE OF ADMISSION:  01/27/2018  PRIMARY CARE PHYSICIAN: Mar Daring, PA-C   REQUESTING/REFERRING PHYSICIAN: Joni Fears  CHIEF COMPLAINT:  Right arm weakness  HISTORY OF PRESENT ILLNESS: Krystal Simpson  is a 57 y.o. female with a known history of anxiety, squamous cell cancers of skin, and depression- gained 80 pound weight in last 6 months.she had lost that weight over last 2 years with help of dietary habits and exercise. Today morning woke up with some pain and weakness in her right upper extremity. She denies any associated numbness, tingling, headache, speech changes, lower extremity complaints. CT scan of the head is negative in ER. ER physician spoke to urologist, and was advised to admit for further stroke work ups.  PAST MEDICAL HISTORY:   Past Medical History:  Diagnosis Date  . Abnormal Pap smear of cervix   . Anxiety   . 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 Depression   . History of kidney stones     PAST SURGICAL HISTORY:  Past Surgical History:  Procedure Laterality Date  . BASAL CELL CARCINOMA EXCISION  2014  . BASAL CELL CARCINOMA EXCISION  09/20/2015   Dr. Evorn Gong, left mid forearm  . MOHS SURGERY  2015   top of head Dr. Shelda Jakes at [facility]  . SQUAMOUS CELL CARCINOMA EXCISION  2014    SOCIAL HISTORY:  Social History   Tobacco Use  . Smoking status: Never Smoker  . Smokeless tobacco: Never Used  Substance Use Topics  . Alcohol use: No    Alcohol/week: 0.0 oz    FAMILY HISTORY:  Family History  Problem Relation Age of Onset  . Emphysema Mother   . Lung cancer Father   . Healthy Sister   . Heart attack Sister 59  . Heart attack Maternal Grandmother   . Breast cancer Sister     DRUG ALLERGIES:  Allergies  Allergen Reactions  . Ciprofloxacin     Other reaction(s):  Joint Pains  . Tetanus Toxoids Hives    REVIEW OF SYSTEMS:   CONSTITUTIONAL: No fever, fatigue or weakness.  EYES: No blurred or double vision.  EARS, NOSE, AND THROAT: No tinnitus or ear pain.  RESPIRATORY: No cough, shortness of breath, wheezing or hemoptysis.  CARDIOVASCULAR: No chest pain, orthopnea, edema.  GASTROINTESTINAL: No nausea, vomiting, diarrhea or abdominal pain.  GENITOURINARY: No dysuria, hematuria.  ENDOCRINE: No polyuria, nocturia,  HEMATOLOGY: No anemia, easy bruising or bleeding SKIN: No rash or lesion. MUSCULOSKELETAL: No joint pain or arthritis.   NEUROLOGIC: No tingling, numbness, weakness.  PSYCHIATRY: No anxiety or depression.   MEDICATIONS AT HOME:  Prior to Admission medications   Medication Sig Start Date End Date Taking? Authorizing Provider  azelastine (ASTELIN) 0.1 % nasal spray Place 2 sprays into both nostrils 2 (two) times daily. Use in each nostril as directed    [provider]  baclofen (LIORESAL) 20 MG tablet Take 1 tablet (20 mg total) by mouth 3 (three) times daily. 01/23/18   Mar Daring, PA-C  Cholecalciferol 1000 UNITS capsule Take 1,000 Units by mouth daily.     [provider]  clonazePAM (KLONOPIN) 0.25 MG disintegrating tablet Take 0.25 mg by mouth daily.  12/08/15   [provider]  CYANOCOBALAMIN PO Take 1 tablet by mouth daily.  [provider]  DULoxetine (CYMBALTA) 30 MG capsule Take 30 mg by mouth daily.    [provider]  Flaxseed, Linseed, (FLAX SEEDS PO) Take 1 capsule by mouth daily.    [provider]  gabapentin (NEURONTIN) 300 MG capsule 3 times as needed 09/28/15   [provider]  HYDROcodone-acetaminophen (NORCO) 10-325 MG tablet Take 1 tablet by mouth every 4 (four) hours as needed. 12/19/17   Mar Daring, PA-C  lamoTRIgine (LAMICTAL) 200 MG tablet Take 200 mg by mouth daily. 01/06/18   [provider]  lisdexamfetamine (VYVANSE) 50  MG capsule Take 1 capsule (50 mg total) by mouth daily. 10/09/17   Mar Daring, PA-C  MULTIPLE VITAMINS-MINERALS PO Take 1 tablet by mouth daily.     [provider]  Omega-3 Fatty Acids (FISH OIL) 1200 MG CAPS Take 1 capsule by mouth daily.     [provider]  polyethylene glycol powder (MIRALAX) powder Take by mouth daily.  04/11/16   [provider]  promethazine (PHENERGAN) 25 MG tablet TAKE 1 TABLET (25 MG TOTAL) BY MOUTH EVERY 6 (SIX) HOURS AS NEEDED FOR NAUSEA. 01/23/15   [provider]  sertraline (ZOLOFT) 100 MG tablet Take 100 mg by mouth 2 (two) times daily.     [provider]  SUMAtriptan (IMITREX) 100 MG tablet TAKE 1 AT HEADACHE ONSET, MAY REPEAT AFTER 2 HOURS, NO MORE THAN 2 IN A DAY,NO MORE THAN 4 IN A WEEK 08/22/14   [provider]  Tetrahydrozoline HCl (EYE DROPS OP) Apply 1 drop to eye 2 (two) times daily.     [provider]  topiramate (TOPAMAX) 25 MG tablet Take 25 mg by mouth daily. 1 tab;et in the morning, and 4 tablets at bedtime    [provider]  traZODone (DESYREL) 50 MG tablet TAKE 1 TO 2 TABLETS BY MOUTH AT BEDTIME FOR SLEEP 01/03/18   [provider]      PHYSICAL EXAMINATION:   VITAL SIGNS: Blood pressure 104/71, pulse 66, temperature 97.9 F (36.6 C), resp. rate 17, height 5\' 4"  (1.626 m), weight 81.6 kg (180 lb), SpO2 98 %.  GENERAL:  57 y.o.-year-old patient lying in the bed with no acute distress.  EYES: Pupils equal, round, reactive to light and accommodation. No scleral icterus. Extraocular muscles intact.  HEENT: Head atraumatic, normocephalic. Oropharynx and nasopharynx clear.  NECK:  Supple, no jugular venous distention. No thyroid enlargement, no tenderness.  LUNGS: Normal breath sounds bilaterally, no wheezing, rales,rhonchi or crepitation. No use of accessory muscles of respiration.  CARDIOVASCULAR: S1, S2 normal. No murmurs, rubs, or gallops.  ABDOMEN: Soft,  nontender, nondistended. Bowel sounds present. No organomegaly or mass.  EXTREMITIES: No pedal edema, cyanosis, or clubbing.  NEUROLOGIC: Cranial nerves II through XII are intact. Muscle strength 5/5 in all extremities. Sensation intact. Gait not checked.  PSYCHIATRIC: The patient is alert and oriented x 3.  SKIN: No obvious rash, lesion, or ulcer.   LABORATORY PANEL:   CBC Recent Labs  Lab 01/27/18 1151  WBC 8.5  HGB 12.2  HCT 35.5  PLT 242  MCV 89.0  MCH 30.6  MCHC 34.3  RDW 13.7  LYMPHSABS 1.2  MONOABS 0.6  EOSABS 0.0  BASOSABS 0.1   ------------------------------------------------------------------------------------------------------------------  Chemistries  Recent Labs  Lab 01/27/18 1045  NA 140  K 3.7  CL 109  CO2 25  GLUCOSE 130*  BUN 19  CREATININE 0.72  CALCIUM 8.9  AST 26  ALT  10*  ALKPHOS 84  BILITOT 1.0   ------------------------------------------------------------------------------------------------------------------ estimated creatinine clearance is 80.2 mL/min (by C-G formula based on SCr of 0.72 mg/dL). ------------------------------------------------------------------------------------------------------------------ No results for input(s): TSH, T4TOTAL, T3FREE, THYROIDAB in the last 72 hours.  Invalid input(s): FREET3   Coagulation profile Recent Labs  Lab 01/27/18 1045 01/27/18 1151  INR TEST WILL BE CREDITED 0.93   ------------------------------------------------------------------------------------------------------------------- No results for input(s): DDIMER in the last 72 hours. -------------------------------------------------------------------------------------------------------------------  Cardiac Enzymes Recent Labs  Lab 01/27/18 1045  TROPONINI <0.03   ------------------------------------------------------------------------------------------------------------------ Invalid input(s):  POCBNP  ---------------------------------------------------------------------------------------------------------------  Urinalysis    Component Value Date/Time   APPEARANCEUR Clear 10/30/2015 1536   GLUCOSEU Negative 10/30/2015 1536   BILIRUBINUR Negative 10/30/2015 1536   PROTEINUR Negative 10/30/2015 1536   UROBILINOGEN negative 09/20/2015 1415   NITRITE Negative 10/30/2015 1536   LEUKOCYTESUR Negative 10/30/2015 1536     RADIOLOGY: Ct Head Code Stroke Wo Contrast  Result Date: 01/27/2018 CLINICAL DATA:  Code stroke. Right arm tingling. Recent fall. Lightheadedness and nausea. EXAM: CT HEAD WITHOUT CONTRAST TECHNIQUE: Contiguous axial images were obtained from the base of the skull through the vertex without intravenous contrast. COMPARISON:  Brain MRI 01/15/2017 FINDINGS: Brain: There is no evidence of acute infarct, intracranial hemorrhage, mass, midline shift, or extra-axial fluid collection. The ventricles and sulci are within normal limits for age. Vascular: No hyperdense vessel. Skull: No fracture or destructive osseous process. Sinuses/Orbits: Visualized paranasal sinuses and mastoid air cells are clear. Visualized orbits are unremarkable. Other: None. ASPECTS [facility] Stroke Program Early CT Score) - Ganglionic level infarction (caudate, lentiform nuclei, internal capsule, insula, M1-M3 cortex): 7 - Supraganglionic infarction (M4-M6 cortex): 3 Total score (0-10 with 10 being normal): 10 IMPRESSION: 1. Negative head CT. 2. ASPECTS is 10. These results were called by telephone at the time of interpretation on 01/27/2018 at 10:24 am to Dr. Burlene Arnt, who verbally acknowledged these results. Electronically Signed   By: Logan Bores M.D.   On: 01/27/2018 10:25    EKG: Orders placed or performed during the hospital encounter of 01/27/18  . ED EKG  . ED EKG  . ED EKG  . ED EKG  . EKG 12-Lead  . EKG 12-Lead    IMPRESSION AND PLAN:  * TIA   Patient still have persistent right  cramping and weakness complaints.   And get frequent neuro checks, physical therapy eval.   Check MRI, echocardiogram, carotid Doppler studies.   Check lipid panel and hemoglobin A   She may need aspirin and statin depending on her results at the time of discharge.  * Depression   Continue home medications.  All the records are reviewed and case discussed with ED provider. Management plans discussed with the patient, family and they are in agreement.  CODE STATUS: full.    TOTAL TIME TAKING CARE OF THIS PATIENT: 45 minutes.    Vaughan Basta M.D on 01/27/2018   Between 7am to 6pm - Pager - [phone]  After 6pm go to [email or web address] - password [email or web address] [facility]  Office  [phone]  CC: Primary care physician; Mar Daring, PA-C   Note: This dictation was prepared with Dragon dictation along with smaller phrase technology. Any transcriptional errors that result from this process are unintentional.

## 2018-01-27 NOTE — Care Management (Signed)
RNCM met with patient to explain [place] letter. She states she is from home where she is independent and able to drive.  She states she is current with PCP Fenton Malling.  She lives with her husband that is not currently present.  No anticipated CM needs.

## 2018-01-28 DIAGNOSIS — R202 Paresthesia of skin: Secondary | ICD-10-CM | POA: Diagnosis not present

## 2018-01-28 DIAGNOSIS — R112 Nausea with vomiting, unspecified: Secondary | ICD-10-CM | POA: Diagnosis not present

## 2018-01-28 DIAGNOSIS — G459 Transient cerebral ischemic attack, unspecified: Secondary | ICD-10-CM | POA: Diagnosis not present

## 2018-01-28 DIAGNOSIS — R200 Anesthesia of skin: Secondary | ICD-10-CM | POA: Diagnosis not present

## 2018-01-28 DIAGNOSIS — R69 Illness, unspecified: Secondary | ICD-10-CM | POA: Diagnosis not present

## 2018-01-28 LAB — ECHOCARDIOGRAM COMPLETE
Height: 64 in
Weight: 2880 oz

## 2018-01-28 LAB — HEMOGLOBIN A1C
Hgb A1c MFr Bld: 4.6 % — ABNORMAL LOW (ref 4.8–5.6)
MEAN PLASMA GLUCOSE: 85.32 mg/dL

## 2018-01-28 MED ORDER — FAMOTIDINE 20 MG PO TABS: 20.0000 mg | ORAL_TABLET | Freq: Two times a day (BID) | ORAL | 0 refills | Status: DC

## 2018-01-28 MED ORDER — LISDEXAMFETAMINE DIMESYLATE 30 MG PO CAPS
30.0000 mg | ORAL_CAPSULE | Freq: Once | ORAL | Status: AC
  Administered 2018-01-28: 30 mg via ORAL
  Filled 2018-01-28: qty 1

## 2018-01-28 MED ORDER — LISDEXAMFETAMINE DIMESYLATE 20 MG PO CAPS
20.0000 mg | ORAL_CAPSULE | Freq: Once | ORAL | Status: AC
  Administered 2018-01-28: 20 mg via ORAL

## 2018-01-28 MED ORDER — ATORVASTATIN CALCIUM 10 MG PO TABS: 10.0000 mg | ORAL_TABLET | Freq: Every day | ORAL | 11 refills | Status: DC

## 2018-01-28 MED ORDER — GABAPENTIN 300 MG PO CAPS: 300.0000 mg | ORAL_CAPSULE | Freq: Two times a day (BID) | ORAL | 0 refills | Status: DC

## 2018-01-28 MED ORDER — PROMETHAZINE HCL 25 MG RE SUPP: 25.0000 mg | Freq: Four times a day (QID) | RECTAL | 1 refills | Status: DC | PRN

## 2018-01-28 NOTE — Progress Notes (Signed)
Subjective: Continues to have pain and numbness in the RUE.  No weakness noted.    Objective: Current vital signs: BP 109/79 (BP Location: Left Arm)   Pulse 82   Temp 98.4 F (36.9 C) (Oral)   Resp 15   Ht 5\' 4"  (1.626 m)   Wt 81.6 kg (180 lb)   SpO2 96%   BMI 30.90 kg/m  Vital signs in last 24 hours: Temp:  [97.7 F (36.5 C)-98.4 F (36.9 C)] 98.4 F (36.9 C) (05/08 0326) Pulse Rate:  [64-88] 82 (05/08 0326) Resp:  [14-27] 15 (05/08 0326) BP: (104-125)/(68-92) 109/79 (05/08 0326) SpO2:  [96 %-100 %] 96 % (05/08 0326) Weight:  [81.6 kg (180 lb)] 81.6 kg (180 lb) (05/07 0955)  Intake/Output from previous day: 05/07 0701 - 05/08 0700 In: 240 [P.O.:240] Out: -  Intake/Output this shift: No intake/output data recorded. Nutritional status:  Diet Order           Diet regular Room service appropriate? Yes; Fluid consistency: Thin  Diet effective now          Neurologic Exam: Mental Status: Alert, oriented, thought content appropriate.  Speech fluent without evidence of aphasia.  Able to follow 3 step commands without difficulty. Cranial Nerves: II: Discs flat bilaterally; Visual fields grossly normal, pupils equal, round, reactive to light and accommodation III,IV, VI: ptosis not present, extra-ocular motions intact bilaterally V,VII: smile symmetric, facial light touch sensation normal bilaterally VIII: hearing normal bilaterally IX,X: gag reflex present XI: bilateral shoulder shrug XII: midline tongue extension Motor: 5/5 throughout Sensory: Pinprick and light touch intact decreased on the RUE   Lab Results: Basic Metabolic Panel: Recent Labs  Lab 01/27/18 1045  NA 140  K 3.7  CL 109  CO2 25  GLUCOSE 130*  BUN 19  CREATININE 0.72  CALCIUM 8.9    Liver Function Tests: Recent Labs  Lab 01/27/18 1045  AST 26  ALT 10*  ALKPHOS 84  BILITOT 1.0  PROT 7.0  ALBUMIN 4.2   No results for input(s): LIPASE, AMYLASE in the last 168 hours. No results  for input(s): AMMONIA in the last 168 hours.  CBC: Recent Labs  Lab 01/27/18 1151  WBC 8.5  NEUTROABS 6.6*  HGB 12.2  HCT 35.5  MCV 89.0  PLT 242    Cardiac Enzymes: Recent Labs  Lab 01/27/18 1045  TROPONINI <0.03    Lipid Panel: Recent Labs  Lab 01/27/18 1151  CHOL 203*  TRIG 76  HDL 54  CHOLHDL 3.8  VLDL 15  LDLCALC 134*    CBG: No results for input(s): GLUCAP in the last 168 hours.  Microbiology: Results for orders placed or performed in visit on 10/30/15  Microscopic Examination     Status: None   Collection Time: 10/30/15  3:36 PM  Result Value Ref Range Status   WBC, UA 0-5 0 - 5 /hpf Final   RBC, UA None seen 0 - 2 /hpf Final   Epithelial Cells (non renal) 0-10 0 - 10 /hpf Final   Bacteria, UA None seen None seen/Few Final    Coagulation Studies: Recent Labs    01/27/18 1045 01/27/18 1151  LABPROT TEST WILL BE CREDITED 12.4  INR TEST WILL BE CREDITED 0.93    Imaging: Dg Chest 2 View  Result Date: 01/27/2018 CLINICAL DATA:  Rapid weight gain. History of squamous cell carcinoma. EXAM: CHEST - 2 VIEW COMPARISON:  01/23/2018 chest radiograph FINDINGS: The heart size and mediastinal contours are within normal limits.  Both lungs are clear. The visualized skeletal structures are unremarkable. IMPRESSION: No active cardiopulmonary disease. Electronically Signed   By: Ulyses Jarred M.D.   On: 01/27/2018 19:16   Mr Jodene Nam Head Wo Contrast  Result Date: 01/27/2018 CLINICAL DATA:  Obesity and hyperlipidemia. Right arm paresthesias. Symptoms began today. EXAM: MRI HEAD WITHOUT CONTRAST MRA HEAD WITHOUT CONTRAST TECHNIQUE: Multiplanar, multiecho pulse sequences of the brain and surrounding structures were obtained without intravenous contrast. Angiographic images of the head were obtained using MRA technique without contrast. COMPARISON:  Head CT same day.  MRI 01/15/2017. FINDINGS: MRI HEAD FINDINGS Brain: Diffusion imaging does not show any acute or subacute  infarction. The brainstem and cerebellum are normal. Cerebral hemispheres are normal except for a few scattered punctate foci of T2 and FLAIR signal within the hemispheric white matter. No cortical or large vessel territory infarction. No mass lesion, hemorrhage, hydrocephalus or extra-axial collection. Vascular: Major vessels at the base of the brain show flow. Skull and upper cervical spine: Negative Sinuses/Orbits: Clear/normal Other: No abnormality seen of the external auditory canal on the right. There was a question of an abnormality in that location in 2017. MRA HEAD FINDINGS Both internal carotid arteries are widely patent into the brain. The anterior and middle cerebral vessels are normal without proximal stenosis, aneurysm or vascular malformation. Both vertebral arteries are widely patent to the basilar. No basilar stenosis. Posterior circulation branch vessels are normal. IMPRESSION: Normal intracranial MR angiography. No acute finding of the brain. No specific explanation of the clinical presentation. Few minimal punctate T2 and FLAIR foci in the cerebral hemispheric white matter could indicate the earliest manifestation of small vessel change. Electronically Signed   By: Nelson Chimes M.D.   On: 01/27/2018 14:33   Mr Brain Wo Contrast  Result Date: 01/27/2018 CLINICAL DATA:  Obesity and hyperlipidemia. Right arm paresthesias. Symptoms began today. EXAM: MRI HEAD WITHOUT CONTRAST MRA HEAD WITHOUT CONTRAST TECHNIQUE: Multiplanar, multiecho pulse sequences of the brain and surrounding structures were obtained without intravenous contrast. Angiographic images of the head were obtained using MRA technique without contrast. COMPARISON:  Head CT same day.  MRI 01/15/2017. FINDINGS: MRI HEAD FINDINGS Brain: Diffusion imaging does not show any acute or subacute infarction. The brainstem and cerebellum are normal. Cerebral hemispheres are normal except for a few scattered punctate foci of T2 and FLAIR signal  within the hemispheric white matter. No cortical or large vessel territory infarction. No mass lesion, hemorrhage, hydrocephalus or extra-axial collection. Vascular: Major vessels at the base of the brain show flow. Skull and upper cervical spine: Negative Sinuses/Orbits: Clear/normal Other: No abnormality seen of the external auditory canal on the right. There was a question of an abnormality in that location in 2017. MRA HEAD FINDINGS Both internal carotid arteries are widely patent into the brain. The anterior and middle cerebral vessels are normal without proximal stenosis, aneurysm or vascular malformation. Both vertebral arteries are widely patent to the basilar. No basilar stenosis. Posterior circulation branch vessels are normal. IMPRESSION: Normal intracranial MR angiography. No acute finding of the brain. No specific explanation of the clinical presentation. Few minimal punctate T2 and FLAIR foci in the cerebral hemispheric white matter could indicate the earliest manifestation of small vessel change. Electronically Signed   By: Nelson Chimes M.D.   On: 01/27/2018 14:33   Mr Cervical Spine Wo Contrast  Result Date: 01/27/2018 CLINICAL DATA:  57 year old female with right arm pain and numbness upon waking today. Associated nausea. Some pain radiating to the  chest. Fall 1 week ago. EXAM: MRI CERVICAL SPINE WITHOUT CONTRAST TECHNIQUE: Multiplanar, multisequence MR imaging of the cervical spine was performed. No intravenous contrast was administered. COMPARISON:  Brain MRI and intracranial MRA today reported separately. FINDINGS: Alignment: Preserved cervical lordosis. Vertebrae: No marrow edema or evidence of acute osseous abnormality. Visualized bone marrow signal is within normal limits. Cord: Spinal cord signal is within normal limits at all visualized levels. Posterior Fossa, vertebral arteries, paraspinal tissues: Cervicomedullary junction is within normal limits. Small 10 millimeter T2 hyperintense  right thyroid lobe nodule which does not meet consensus criteria for ultrasound follow-up. Preserved major vascular flow voids in the neck. Negative visible neck soft tissues and lung apices. Disc levels: C2-C3:  Borderline to mild facet hypertrophy.  No stenosis. C3-C4: Moderate left facet hypertrophy. Mild disc bulge with small central disc protrusion. No spinal stenosis. Mild left C4 foraminal stenosis. C4-C5: Mild to moderate facet hypertrophy. Minimal disc bulge and endplate spurring. No spinal stenosis. Mild left and mild to moderate right C5 foraminal stenosis. C5-C6: Disc space loss with circumferential disc bulge and endplate spurring. Broad-based posterior component. Mild facet hypertrophy. Borderline to mild spinal stenosis. No cord mass effect. Moderate left and moderate to severe right C6 foraminal stenosis. C6-C7: Minimal disc bulge and endplate spurring. Mild facet and ligament flavum hypertrophy. No spinal stenosis. No convincing foraminal stenosis. C7-T1: Mild left and moderate to severe. Mild endplate right facet hypertrophy spurring. Borderline to mild right C8 foraminal stenosis. T1-T2: Mild facet hypertrophy.  Otherwise negative. IMPRESSION: 1.  No acute osseous abnormality. 2. 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 3. Multifactorial moderate or severe neural foraminal stenosis at the right C5, right greater than left C6 nerve levels. Electronically Signed   By: Genevie Ann M.D.   On: 01/27/2018 20:15   US Carotid Bilateral (at [facility] Only)  Result Date: 01/28/2018 CLINICAL DATA:  TIA. EXAM: BILATERAL CAROTID DUPLEX ULTRASOUND TECHNIQUE: Pearline Cables scale imaging, color Doppler and duplex ultrasound were performed of bilateral carotid and vertebral arteries in the neck. COMPARISON:  None. FINDINGS: Criteria: Quantification of carotid stenosis is based on velocity parameters that correlate the residual  internal carotid diameter with NASCET-based stenosis levels, using the diameter of the distal internal carotid lumen as the denominator for stenosis measurement. The following velocity measurements were obtained: RIGHT ICA:  72/26 cm/sec CCA:  54/27 cm/sec SYSTOLIC ICA/CCA RATIO:  0.8 ECA:  86 cm/sec LEFT ICA:  91/19 cm/sec CCA:  06/23 cm/sec SYSTOLIC ICA/CCA RATIO:  1.0 ECA:  71 cm/sec RIGHT CAROTID ARTERY: There is no grayscale evidence of significant intimal thickening or plaque affecting the interrogated portions of the left carotid system. There are no elevated peak systolic velocities within the interrogated course of the right internal carotid artery to suggest a hemodynamically significant stenosis. RIGHT VERTEBRAL ARTERY:  Antegrade flow LEFT CAROTID ARTERY: There is no grayscale evidence of significant intimal thickening or atherosclerotic plaque affecting interrogated portions of the left carotid system. There are no elevated peak systolic velocities within the interrogated course the left internal carotid artery to suggest a hemodynamically significant stenosis. LEFT VERTEBRAL ARTERY:  Antegrade flow IMPRESSION: Normal carotid Doppler ultrasound. Electronically Signed   By: Sandi Mariscal M.D.   On: 01/28/2018 07:46   Ct Head Code Stroke Wo Contrast  Result Date: 01/27/2018 CLINICAL DATA:  Code stroke. Right arm tingling. Recent fall. Lightheadedness and nausea. EXAM: CT HEAD WITHOUT CONTRAST TECHNIQUE: Contiguous  axial images were obtained from the base of the skull through the vertex without intravenous contrast. COMPARISON:  Brain MRI 01/15/2017 FINDINGS: Brain: There is no evidence of acute infarct, intracranial hemorrhage, mass, midline shift, or extra-axial fluid collection. The ventricles and sulci are within normal limits for age. Vascular: No hyperdense vessel. Skull: No fracture or destructive osseous process. Sinuses/Orbits: Visualized paranasal sinuses and mastoid air cells are clear.  Visualized orbits are unremarkable. Other: None. ASPECTS [facility] Stroke Program Early CT Score) - Ganglionic level infarction (caudate, lentiform nuclei, internal capsule, insula, M1-M3 cortex): 7 - Supraganglionic infarction (M4-M6 cortex): 3 Total score (0-10 with 10 being normal): 10 IMPRESSION: 1. Negative head CT. 2. ASPECTS is 10. These results were called by telephone at the time of interpretation on 01/27/2018 at 10:24 am to Dr. Burlene Arnt, who verbally acknowledged these results. Electronically Signed   By: Logan Bores M.D.   On: 01/27/2018 10:25    Medications:  I have reviewed the patient's current medications. Scheduled: . atorvastatin  40 mg Oral q1800  . azelastine  2 spray Each Nare BID  . baclofen  20 mg Oral TID  . cholecalciferol  1,000 Units Oral Daily  . DULoxetine  30 mg Oral Daily  . gabapentin  300 mg Oral BID  . heparin  5,000 Units Subcutaneous Q8H  . lamoTRIgine  200 mg Oral Daily  . lisdexamfetamine  20 mg Oral Once   And  . lisdexamfetamine  30 mg Oral Once  . lisdexamfetamine  50 mg Oral Daily  . omega-3 acid ethyl esters  1 g Oral Daily  . sertraline  100 mg Oral BID  . tetrahydrozoline  1 drop Both Eyes BID  . topiramate  100 mg Oral QHS  . topiramate  25 mg Oral q morning - 10a    Assessment/Plan: Continued RUE symptoms.  MRI of the cervical spine shows some mild spinal stenosis at C5-6 and moderate to severe neuroforaminal stenosis at right C5. No further inpatient neurologic intervention is recommended at this time.  If further questions arise, please call or page at that time.  Thank you for allowing neurology to participate in the care of this patient.  Patient to follow up on an outpatient basis.    LOS: 0 days   Alexis Goodell, MD Neurology [phone] 01/28/2018  9:51 AM

## 2018-01-28 NOTE — Evaluation (Signed)
Physical Therapy Evaluation Patient Details Name: Krystal Simpson MRN: [id number] DOB: 09-04-1961 Today's Date: 01/28/2018   History of Present Illness  Pt is a 56 y/o F who presented with pain and weakness in RUE. CT head negative.  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  MRI brain with no acute findings.  Pt's PMH includes skin cancer, anxiety, depression.      Clinical Impression  Pt admitted with above diagnosis. Pt currently with functional limitations due to the deficits listed below (see PT Problem List). Pt denies pain and numbness/tingling in RUE.  Strength, coordination, and sensation in BUEs and BLEs WNL.  Pt demonstrates impaired balance with higher level balance activities and with tandem stance and SLS with reports of 3 falls in the past 6 months.  Pt additionally with h/o chronic LBP for which she has never seen PT.  Pt independent with bed mobility, modified independent with cues provided for safety with sit>stand, and supervision for safety provided while ambulating without AD. Pt will benefit from skilled PT to increase their independence and safety with mobility to allow discharge to the venue listed below.      Follow Up Recommendations Outpatient PT(to address balance and LBP)    Equipment Recommendations  None recommended by PT    Recommendations for Other Services       Precautions / Restrictions Precautions Precautions: Fall;Other (comment) Precaution Comments: fx rib on L side Restrictions Weight Bearing Restrictions: No      Mobility  Bed Mobility Overal bed mobility: Independent             General bed mobility comments: No physical assist or cues needed.  Pt performs independently.   Transfers Overall transfer level: Modified independent Equipment used: None             General transfer comment: On first sit>stand the pt reaches out for support from this PT.   Had pt sit back down and perform sit>stand again with cues to push from bed to stand and pt able to do so while remaining steady.  Cues to reach back for support when sitting.   Ambulation/Gait Ambulation/Gait assistance: Supervision Ambulation Distance (Feet): 200 Feet Assistive device: None Gait Pattern/deviations: WFL(Within Functional Limits)     General Gait Details: Pt with steady gait without challenges.  With higher level balance activities the pt demonstrates mild instability with head turns (vertically and horizontally).    Stairs            Wheelchair Mobility    Modified Rankin (Stroke Patients Only)       Balance Overall balance assessment: Needs assistance;History of Falls Sitting-balance support: No upper extremity supported;Feet supported Sitting balance-Leahy Scale: Normal     Standing balance support: No upper extremity supported;During functional activity Standing balance-Leahy Scale: Good               High level balance activites: Backward walking;Direction changes;Turns;Sudden stops;Head turns High Level Balance Comments: Mild instabilty with vertical and horizontal head turns but no LOB Standardized Balance Assessment Standardized Balance Assessment : Dynamic Gait Index   Dynamic Gait Index Level Surface: Normal Change in Gait Speed: Normal Gait with Horizontal Head Turns: Mild Impairment Gait with Vertical Head Turns: Mild Impairment Gait and Pivot Turn: Normal       Pertinent Vitals/Pain Pain Assessment: 0-10 Pain Score: 2  Pain Location: site of L rib fx (~1 wk ago) Pain Descriptors /  Indicators: Aching;Discomfort;Guarding Pain Intervention(s): Limited activity within patient's tolerance;Monitored during session    [place] expects to be discharged to:: Private residence Living Arrangements: Spouse/significant other;Children Available Help at Discharge: Family Type of Home: Other(Comment)(townhome) Home  Access: Level entry     Home Layout: One level Home Equipment: None      Prior Function Level of Independence: Independent         Comments: Does not use AD at baseline.  Independent with ADLs, IADLs.  3 falls in the past 6 months.      Hand Dominance   Dominant Hand: Right    Extremity/Trunk Assessment   Upper Extremity Assessment Upper Extremity Assessment: Overall WFL for tasks assessed(Strength, coordination, sensation all WNL)    Lower Extremity Assessment Lower Extremity Assessment: Overall WFL for tasks assessed(Strength, coordination, sensation all WNL)    Cervical / Trunk Assessment Cervical / Trunk Assessment: Other exceptions Cervical / Trunk Exceptions: h/o LBP  Communication   Communication: No difficulties  Cognition Arousal/Alertness: Awake/alert Behavior During Therapy: WFL for tasks assessed/performed Overall Cognitive Status: Within Functional Limits for tasks assessed                                        General Comments General comments (skin integrity, edema, etc.): SLS testing: ~2 seconds Bil, Bil tandem stance testing: ~5 seconds Bil    Exercises     Assessment/Plan    PT Assessment Patient needs continued PT services  PT Problem List Decreased balance;Decreased safety awareness;Pain       PT Treatment Interventions DME instruction;Gait training;Stair training;Functional mobility training;Therapeutic activities;Therapeutic exercise;Balance training;Neuromuscular re-education;Patient/family education    PT Goals (Current goals can be found in the Care Plan section)  Acute Rehab PT Goals Patient Stated Goal: to improve balance and decrease back pain PT Goal Formulation: With patient Time For Goal Achievement: 02/11/18 Potential to Achieve Goals: Good    Frequency Min 2X/week   Barriers to discharge        Co-evaluation               AM-PAC PT "6 Clicks" Daily Activity  Outcome Measure Difficulty  turning over in bed (including adjusting bedclothes, sheets and blankets)?: None Difficulty moving from lying on back to sitting on the side of the bed? : None Difficulty sitting down on and standing up from a chair with arms (e.g., wheelchair, bedside commode, etc,.)?: A Little Help needed moving to and from a bed to chair (including a wheelchair)?: A Little Help needed walking in hospital room?: A Little Help needed climbing 3-5 steps with a railing? : A Little 6 Click Score: 20    End of Session Equipment Utilized During Treatment: Gait belt Activity Tolerance: Patient tolerated treatment well Patient left: in bed;with call bell/phone within reach;with bed alarm set Nurse Communication: Mobility status PT Visit Diagnosis: Unsteadiness on feet (R26.81);History of falling (Z91.81)    Time: 1660-6301 PT Time Calculation (min) (ACUTE ONLY): 33 min   Charges:   PT Evaluation $PT Eval Low Complexity: 1 Low PT Treatments $Neuromuscular Re-education: 8-22 mins   PT G Codes:        Collie Siad PT, DPT 01/28/2018, 12:52 PM

## 2018-01-28 NOTE — Discharge Planning (Signed)
Patient IV removed. RN assessment and VS revealed stability for DC to home.  Discharge papers given, explained and educated.  No FU appts needed at this time. Scripts e-scribed to [facility]), [place].  Once ready, wheeled to front and family transporting home via car.

## 2018-01-28 NOTE — Discharge Summary (Signed)
Krystal Simpson, is a 57 y.o. female  DOB Aug 30, 1961  MRN [id number].  Admission date:  01/27/2018  Admitting Physician  Vaughan Basta, MD  Discharge Date:  01/28/2018   Primary MD  Mar Daring, PA-C  Recommendations for primary care physician for things to follow:   Follow-up with PCP in 1 week   Admission Diagnosis  Suspected stroke patient last known to be well more than 2 hours ago [R09.89] Paresthesia of right upper extremity [R20.2]   Discharge Diagnosis  Suspected stroke patient last known to be well more than 2 hours ago [R09.89] Paresthesia of right upper extremity [R20.2]   Active Problems:   TIA (transient ischemic attack)      Past Medical History:  Diagnosis Date  . Abnormal Pap smear of cervix   . Anxiety   . 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 Depression   . History of kidney stones     Past Surgical History:  Procedure Laterality Date  . BASAL CELL CARCINOMA EXCISION  2014  . BASAL CELL CARCINOMA EXCISION  09/20/2015   Dr. Evorn Gong, left mid forearm  . MOHS SURGERY  2015   top of head Dr. Shelda Jakes at [facility]  . SQUAMOUS CELL CARCINOMA EXCISION  2014       History of present illness and  Hospital Course:     Kindly see H&P for history of present illness and admission details, please review complete Labs, Consult reports and Test reports for all details in brief  HPI  from the history and physical done on the day of admission 57 year old female patient with history of depression, migraines, brought in because of heaviness in the right arm and evaluation for TIA.   Hospital Course   #1 right upper extremity heaviness, pain: Patient a work-up has been negative CT head negative in the emergency room, patient is admitted for evaluation of  TIA.  Patient did not have any weakness in hands, legs.main complaint is tingling or numbness of the right hand.  MRI of the brain, MRA of the brain did not show acute stroke.  Seen by Dr. Doy Mince from neurology, she ordered MRI of her C-spine which showed degeneration, especially in C5 and C6 on the right side.  No cord compression.  Told the patient that she can take Neurontin, and avoid heavy weightlifting, patient already following with Dr. Ileene Hutchinson her chronic back pain a, I advised her to follow-up with him. #2 history of depression: Patient is on Zoloft and Cymbalta. 3.  Chronic migraines on and off: Patient takes Imitrex, Norco, Topamax, Lamictal. 4.  Having nausea since she started on baclofen recently for her rib pains: Patient rib x-rays done week ago did not show any fracture.  So I told her to discontinue baclofen and see if her nausea improves.  She wanted Phenergan suppository just in case if she needs it so I called in Phenergan suppository. 5.  Mild hyperlipidemia: Patient wants to work on her diet and also lose weight before starting the medicine. #6 severe anxiety. 7.  Discussed with patient about all her medicines. Follow echocardiogram.  Discharge home once we get echo results.  Discharge Condition:stable   Follow UP      Discharge Instructions  and  Discharge Medications      Allergies as of 01/28/2018      Reactions   Ciprofloxacin    Other reaction(s): Joint Pains   Tetanus Toxoids  Hives      Medication List    STOP taking these medications   aspirin EC 81 MG tablet     TAKE these medications   atorvastatin 10 MG tablet Commonly known as:  LIPITOR Take 1 tablet (10 mg total) by mouth daily.   azelastine 0.1 % nasal spray Commonly known as:  ASTELIN Place 2 sprays into both nostrils 2 (two) times daily. Use in each nostril as directed   baclofen 20 MG tablet Commonly known as:  LIORESAL Take 1 tablet (20 mg total) by mouth 3 (three) times  daily.   Cholecalciferol 1000 units capsule Take 1,000 Units by mouth daily.   clonazePAM 0.25 MG disintegrating tablet Commonly known as:  KLONOPIN Take 0.25 mg by mouth daily.   cyanocobalamin 1000 MCG tablet Take 1,000 mcg by mouth daily.   DULoxetine 30 MG capsule Commonly known as:  CYMBALTA Take 30 mg by mouth daily.   EYE DROPS OP Apply 1 drop to eye 2 (two) times daily.   famotidine 20 MG tablet Commonly known as:  PEPCID Take 1 tablet (20 mg total) by mouth 2 (two) times daily.   Fish Oil 1000 MG Caps Take 1 capsule by mouth daily.   FLAX SEEDS PO Take 1 capsule by mouth daily.   gabapentin 300 MG capsule Commonly known as:  NEURONTIN Take 1 capsule (300 mg total) by mouth 2 (two) times daily. What changed:  See the new instructions.   HYDROcodone-acetaminophen 10-325 MG tablet Commonly known as:  NORCO Take 1 tablet by mouth every 4 (four) hours as needed.   lamoTRIgine 200 MG tablet Commonly known as:  LAMICTAL Take 200 mg by mouth daily.   MIRALAX PO Take 17 g by mouth daily.   MULTIPLE VITAMINS-MINERALS PO Take 1 tablet by mouth daily.   promethazine 25 MG tablet Commonly known as:  PHENERGAN TAKE 1 TABLET (25 MG TOTAL) BY MOUTH EVERY 6 (SIX) HOURS AS NEEDED FOR NAUSEA. What changed:  Another medication with the same name was added. Make sure you understand how and when to take each.   promethazine 25 MG suppository Commonly known as:  PHENERGAN Place 1 suppository (25 mg total) rectally every 6 (six) hours as needed for nausea. What changed:  You were already taking a medication with the same name, and this prescription was added. Make sure you understand how and when to take each.   sertraline 100 MG tablet Commonly known as:  ZOLOFT Take 100 mg by mouth 2 (two) times daily.   SUMAtriptan 100 MG tablet Commonly known as:  IMITREX TAKE 1 AT HEADACHE ONSET, MAY REPEAT AFTER 2 HOURS, NO MORE THAN 2 IN A DAY,NO MORE THAN 4 IN A WEEK    topiramate 25 MG tablet Commonly known as:  TOPAMAX Take 20-100 mg by mouth 2 (two) times daily. Take 25 mg by mouth in the morning and 100 mg by mouth at bedtime.   traZODone 50 MG tablet Commonly known as:  DESYREL Take 100 mg by mouth at bedtime as needed for sleep.   VYVANSE 50 MG capsule Generic drug:  lisdexamfetamine Take 1 capsule (50 mg total) by mouth daily.         Diet and Activity recommendation: See Discharge Instructions above   Consults obtained -neurology  Major procedures and Radiology Reports - PLEASE review detailed and final reports for all details, in brief -      Dg Chest 2 View  Result Date: 01/27/2018 CLINICAL DATA:  Rapid weight gain. History of squamous cell carcinoma. EXAM: CHEST - 2 VIEW COMPARISON:  01/23/2018 chest radiograph FINDINGS: The heart size and mediastinal contours are within normal limits. Both lungs are clear. The visualized skeletal structures are unremarkable. IMPRESSION: No active cardiopulmonary disease. Electronically Signed   By: Ulyses Jarred M.D.   On: 01/27/2018 19:16   Dg Ribs Unilateral Left  Result Date: 01/23/2018 CLINICAL DATA:  Fall with rib pain on the left. Initial encounter. EXAM: LEFT RIBS - 2 VIEW COMPARISON:  09/29/2015 FINDINGS: No fracture or other bone lesions are seen involving the ribs. The low left pain marker is likely over the costal cartilage. Negative for hemothorax or pneumothorax. Normal heart size. IMPRESSION: Negative left rib series. Electronically Signed   By: Monte Fantasia M.D.   On: 01/23/2018 17:19   Mr Jodene Nam Head Wo Contrast  Result Date: 01/27/2018 CLINICAL DATA:  Obesity and hyperlipidemia. Right arm paresthesias. Symptoms began today. EXAM: MRI HEAD WITHOUT CONTRAST MRA HEAD WITHOUT CONTRAST TECHNIQUE: Multiplanar, multiecho pulse sequences of the brain and surrounding structures were obtained without intravenous contrast. Angiographic images of the head were obtained using MRA technique without  contrast. COMPARISON:  Head CT same day.  MRI 01/15/2017. FINDINGS: MRI HEAD FINDINGS Brain: Diffusion imaging does not show any acute or subacute infarction. The brainstem and cerebellum are normal. Cerebral hemispheres are normal except for a few scattered punctate foci of T2 and FLAIR signal within the hemispheric white matter. No cortical or large vessel territory infarction. No mass lesion, hemorrhage, hydrocephalus or extra-axial collection. Vascular: Major vessels at the base of the brain show flow. Skull and upper cervical spine: Negative Sinuses/Orbits: Clear/normal Other: No abnormality seen of the external auditory canal on the right. There was a question of an abnormality in that location in 2017. MRA HEAD FINDINGS Both internal carotid arteries are widely patent into the brain. The anterior and middle cerebral vessels are normal without proximal stenosis, aneurysm or vascular malformation. Both vertebral arteries are widely patent to the basilar. No basilar stenosis. Posterior circulation branch vessels are normal. IMPRESSION: Normal intracranial MR angiography. No acute finding of the brain. No specific explanation of the clinical presentation. Few minimal punctate T2 and FLAIR foci in the cerebral hemispheric white matter could indicate the earliest manifestation of small vessel change. Electronically Signed   By: Nelson Chimes M.D.   On: 01/27/2018 14:33   Mr Brain Wo Contrast  Result Date: 01/27/2018 CLINICAL DATA:  Obesity and hyperlipidemia. Right arm paresthesias. Symptoms began today. EXAM: MRI HEAD WITHOUT CONTRAST MRA HEAD WITHOUT CONTRAST TECHNIQUE: Multiplanar, multiecho pulse sequences of the brain and surrounding structures were obtained without intravenous contrast. Angiographic images of the head were obtained using MRA technique without contrast. COMPARISON:  Head CT same day.  MRI 01/15/2017. FINDINGS: MRI HEAD FINDINGS Brain: Diffusion imaging does not show any acute or subacute  infarction. The brainstem and cerebellum are normal. Cerebral hemispheres are normal except for a few scattered punctate foci of T2 and FLAIR signal within the hemispheric white matter. No cortical or large vessel territory infarction. No mass lesion, hemorrhage, hydrocephalus or extra-axial collection. Vascular: Major vessels at the base of the brain show flow. Skull and upper cervical spine: Negative Sinuses/Orbits: Clear/normal Other: No abnormality seen of the external auditory canal on the right. There was a question of an abnormality in that location in 2017. MRA HEAD FINDINGS Both internal carotid arteries are widely patent into the brain. The anterior and middle cerebral vessels are normal without  proximal stenosis, aneurysm or vascular malformation. Both vertebral arteries are widely patent to the basilar. No basilar stenosis. Posterior circulation branch vessels are normal. IMPRESSION: Normal intracranial MR angiography. No acute finding of the brain. No specific explanation of the clinical presentation. Few minimal punctate T2 and FLAIR foci in the cerebral hemispheric white matter could indicate the earliest manifestation of small vessel change. Electronically Signed   By: Nelson Chimes M.D.   On: 01/27/2018 14:33   Mr Cervical Spine Wo Contrast  Result Date: 01/27/2018 CLINICAL DATA:  56 year old female with right arm pain and numbness upon waking today. Associated nausea. Some pain radiating to the chest. Fall 1 week ago. EXAM: MRI CERVICAL SPINE WITHOUT CONTRAST TECHNIQUE: Multiplanar, multisequence MR imaging of the cervical spine was performed. No intravenous contrast was administered. COMPARISON:  Brain MRI and intracranial MRA today reported separately. FINDINGS: Alignment: Preserved cervical lordosis. Vertebrae: No marrow edema or evidence of acute osseous abnormality. Visualized bone marrow signal is within normal limits. Cord: Spinal cord signal is within normal limits at all visualized  levels. Posterior Fossa, vertebral arteries, paraspinal tissues: Cervicomedullary junction is within normal limits. Small 10 millimeter T2 hyperintense right thyroid lobe nodule which does not meet consensus criteria for ultrasound follow-up. Preserved major vascular flow voids in the neck. Negative visible neck soft tissues and lung apices. Disc levels: C2-C3:  Borderline to mild facet hypertrophy.  No stenosis. C3-C4: Moderate left facet hypertrophy. Mild disc bulge with small central disc protrusion. No spinal stenosis. Mild left C4 foraminal stenosis. C4-C5: Mild to moderate facet hypertrophy. Minimal disc bulge and endplate spurring. No spinal stenosis. Mild left and mild to moderate right C5 foraminal stenosis. C5-C6: Disc space loss with circumferential disc bulge and endplate spurring. Broad-based posterior component. Mild facet hypertrophy. Borderline to mild spinal stenosis. No cord mass effect. Moderate left and moderate to severe right C6 foraminal stenosis. C6-C7: Minimal disc bulge and endplate spurring. Mild facet and ligament flavum hypertrophy. No spinal stenosis. No convincing foraminal stenosis. C7-T1: Mild left and moderate to severe. Mild endplate right facet hypertrophy spurring. Borderline to mild right C8 foraminal stenosis. T1-T2: Mild facet hypertrophy.  Otherwise negative. IMPRESSION: 1.  No acute osseous abnormality. 2. 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 3. Multifactorial moderate or severe neural foraminal stenosis at the right C5, right greater than left C6 nerve levels. Electronically Signed   By: Genevie Ann M.D.   On: 01/27/2018 20:15   US Carotid Bilateral (at [facility] Only)  Result Date: 01/28/2018 CLINICAL DATA:  TIA. EXAM: BILATERAL CAROTID DUPLEX ULTRASOUND TECHNIQUE: Pearline Cables scale imaging, color Doppler and duplex ultrasound were performed of bilateral carotid and vertebral arteries in  the neck. COMPARISON:  None. FINDINGS: Criteria: Quantification of carotid stenosis is based on velocity parameters that correlate the residual internal carotid diameter with NASCET-based stenosis levels, using the diameter of the distal internal carotid lumen as the denominator for stenosis measurement. The following velocity measurements were obtained: RIGHT ICA:  72/26 cm/sec CCA:  40/98 cm/sec SYSTOLIC ICA/CCA RATIO:  0.8 ECA:  86 cm/sec LEFT ICA:  91/19 cm/sec CCA:  11/91 cm/sec SYSTOLIC ICA/CCA RATIO:  1.0 ECA:  71 cm/sec RIGHT CAROTID ARTERY: There is no grayscale evidence of significant intimal thickening or plaque affecting the interrogated portions of the left carotid system. There are no elevated peak systolic velocities within the interrogated course of the right internal carotid artery to suggest a hemodynamically  significant stenosis. RIGHT VERTEBRAL ARTERY:  Antegrade flow LEFT CAROTID ARTERY: There is no grayscale evidence of significant intimal thickening or atherosclerotic plaque affecting interrogated portions of the left carotid system. There are no elevated peak systolic velocities within the interrogated course the left internal carotid artery to suggest a hemodynamically significant stenosis. LEFT VERTEBRAL ARTERY:  Antegrade flow IMPRESSION: Normal carotid Doppler ultrasound. Electronically Signed   By: Sandi Mariscal M.D.   On: 01/28/2018 07:46   Ct Head Code Stroke Wo Contrast  Result Date: 01/27/2018 CLINICAL DATA:  Code stroke. Right arm tingling. Recent fall. Lightheadedness and nausea. EXAM: CT HEAD WITHOUT CONTRAST TECHNIQUE: Contiguous axial images were obtained from the base of the skull through the vertex without intravenous contrast. COMPARISON:  Brain MRI 01/15/2017 FINDINGS: Brain: There is no evidence of acute infarct, intracranial hemorrhage, mass, midline shift, or extra-axial fluid collection. The ventricles and sulci are within normal limits for age. Vascular: No hyperdense  vessel. Skull: No fracture or destructive osseous process. Sinuses/Orbits: Visualized paranasal sinuses and mastoid air cells are clear. Visualized orbits are unremarkable. Other: None. ASPECTS [facility] Stroke Program Early CT Score) - Ganglionic level infarction (caudate, lentiform nuclei, internal capsule, insula, M1-M3 cortex): 7 - Supraganglionic infarction (M4-M6 cortex): 3 Total score (0-10 with 10 being normal): 10 IMPRESSION: 1. Negative head CT. 2. ASPECTS is 10. These results were called by telephone at the time of interpretation on 01/27/2018 at 10:24 am to Dr. Burlene Arnt, who verbally acknowledged these results. Electronically Signed   By: Logan Bores M.D.   On: 01/27/2018 10:25    Micro Results    No results found for this or any previous visit (from the past 240 hour(s)).     Today   Subjective:   Krystal Simpson today has no headache,no chest abdominal pain,no new weakness tingling or numbness, feels much better wants to go home today.   Objective:   Blood pressure 109/79, pulse 82, temperature 98.4 F (36.9 C), temperature source Oral, resp. rate 15, height 5\' 4"  (1.626 m), weight 81.6 kg (180 lb), SpO2 96 %.   Intake/Output Summary (Last 24 hours) at 01/28/2018 1015 Last data filed at 01/27/2018 1917 Gross per 24 hour  Intake 240 ml  Output -  Net 240 ml    Exam Awake Alert, Oriented x 3, No new F.N deficits, Normal affect [place].AT,PERRAL Supple Neck,No JVD, No cervical lymphadenopathy appriciated.  Symmetrical Chest wall movement, Good air movement bilaterally, CTAB RRR,No Gallops,Rubs or new Murmurs, No Parasternal Heave +ve B.Sounds, Abd Soft, Non tender, No organomegaly appriciated, No rebound -guarding or rigidity. No Cyanosis, Clubbing or edema, No new Rash or bruise  Data Review   CBC w Diff:  Lab Results  Component Value Date   WBC 8.5 01/27/2018   HGB 12.2 01/27/2018   HGB 11.9 10/09/2017   HCT 35.5 01/27/2018   HCT 35.6 10/09/2017   PLT 242 01/27/2018    PLT 241 10/09/2017   LYMPHOPCT 14 01/27/2018   MONOPCT 7 01/27/2018   EOSPCT 0 01/27/2018   BASOPCT 1 01/27/2018    CMP:  Lab Results  Component Value Date   NA 140 01/27/2018   NA 144 10/09/2017   K 3.7 01/27/2018   CL 109 01/27/2018   CO2 25 01/27/2018   BUN 19 01/27/2018   BUN 21 10/09/2017   CREATININE 0.72 01/27/2018   GLU 80 09/30/2014   PROT 7.0 01/27/2018   PROT 6.7 10/09/2017   ALBUMIN 4.2 01/27/2018   ALBUMIN 4.5 10/09/2017  BILITOT 1.0 01/27/2018   BILITOT 0.3 10/09/2017   ALKPHOS 84 01/27/2018   AST 26 01/27/2018   ALT 10 (L) 01/27/2018  .   Total Time in preparing paper work, data evaluation and todays exam - 35 minutes  Epifanio Lesches M.D on 01/28/2018 at 10:15 AM    Note: This dictation was prepared with Dragon dictation along with smaller phrase technology. Any transcriptional errors that result from this process are unintentional.

## 2018-01-28 NOTE — Progress Notes (Signed)
OT Screen Note  Patient Details Name: Krystal Simpson MRN: [id number] DOB: 1960-12-12   Cancelled Treatment:    Reason Eval/Treat Not Completed: OT screened, no needs identified, will sign off. Order received, chart reviewed. Pt back to baseline functional independence, only very mild N/T in fingertips of R hand, a significant improvement from initial onset of symptoms. Pt briefly instructed in s/s of neurological events and safety precautions while she is experiencing decreased sensation to minimize risk of injury. No skilled OT needs identified. Will sign off. Please re-consult if additional needs arise.   Jeni Salles, MPH, MS, OTR/L ascom [phone] 01/28/18, 10:58 AM

## 2018-01-28 NOTE — Progress Notes (Signed)
SLP Cancellation Note  Patient Details Name: Krystal Simpson MRN: [id number] DOB: [birth year]   Cancelled treatment:       Reason Eval/Treat Not Completed: SLP screened, no needs identified, will sign off(chart reviewed; consulted NSG then met w/ pt). Pt denied any difficulty swallowing and is currently on a regular diet; tolerates swallowing pills w/ water per NSG. Pt conversed at conversational level w/out deficits noted; pt denied any speech-language deficits.  No further skilled ST services indicated as pt appears at her baseline. Pt agreed. NSG to reconsult if any change in status.     Orinda Kenner, MS, CCC-SLP Thomes Burak 01/28/2018, 4:25 PM

## 2018-01-29 LAB — HIV ANTIBODY (ROUTINE TESTING W REFLEX): HIV Screen 4th Generation wRfx: NONREACTIVE

## 2018-02-04 ENCOUNTER — Other Ambulatory Visit: Payer: Self-pay

## 2018-02-04 ENCOUNTER — Ambulatory Visit (INDEPENDENT_AMBULATORY_CARE_PROVIDER_SITE_OTHER): Payer: Medicare HMO | Admitting: Physician Assistant

## 2018-02-04 ENCOUNTER — Encounter: Payer: Self-pay | Admitting: Physician Assistant

## 2018-02-04 VITALS — BP 130/86 | HR 99 | Wt 172.0 lb

## 2018-02-04 DIAGNOSIS — M7662 Achilles tendinitis, left leg: Secondary | ICD-10-CM | POA: Diagnosis not present

## 2018-02-04 DIAGNOSIS — M5020 Other cervical disc displacement, unspecified cervical region: Secondary | ICD-10-CM | POA: Diagnosis not present

## 2018-02-04 DIAGNOSIS — M533 Sacrococcygeal disorders, not elsewhere classified: Secondary | ICD-10-CM | POA: Diagnosis not present

## 2018-02-04 DIAGNOSIS — M5412 Radiculopathy, cervical region: Secondary | ICD-10-CM

## 2018-02-04 DIAGNOSIS — M4802 Spinal stenosis, cervical region: Secondary | ICD-10-CM

## 2018-02-04 DIAGNOSIS — M7661 Achilles tendinitis, right leg: Secondary | ICD-10-CM

## 2018-02-04 MED ORDER — HYDROCODONE-ACETAMINOPHEN 10-325 MG PO TABS: 1.0000 | ORAL_TABLET | ORAL | 0 refills | Status: DC | PRN

## 2018-02-04 NOTE — Progress Notes (Signed)
Patient: Krystal Simpson Female    DOB: 1960-11-30   57 y.o.   MRN: [id number] Visit Date: 02/04/2018  Today's Provider: Mar Daring, PA-C   Chief Complaint  Patient presents with  . Hospitalization Follow-up   Subjective:    HPI  Follow up Hospitalization  Patient was admitted to [facility] on 01/27/2018 and discharged on 01/28/2018. She was treated for right arm paresthesia. Treatment for this included mri, ekg, echocardiogram, carotid US and MR cervical spine. Telephone follow up was not done. She reports good compliance with treatment. Advised to take Gabapentin and f/u with Dr. Sharlet Salina.  She reports this condition is Unchanged. MRI did not show acute infarct. Carotid US and echocardiogram were unremarkable. MR cervical spine showed cervical spine degeneration with spinal stenosis at C5-C6 and multifactorial severe foraminal stenosis at C5 to the right. This could be cause of her right arm paresthesia. ------------------------------------------------------------------------------------     Allergies  Allergen Reactions  . Ciprofloxacin     Other reaction(s): Joint Pains  . Tetanus Toxoids Hives     Current Outpatient Medications:  .  atorvastatin (LIPITOR) 10 MG tablet, Take 1 tablet (10 mg total) by mouth daily., Disp: 30 tablet, Rfl: 11 .  azelastine (ASTELIN) 0.1 % nasal spray, Place 2 sprays into both nostrils 2 (two) times daily. Use in each nostril as directed, Disp: , Rfl:  .  baclofen (LIORESAL) 20 MG tablet, Take 1 tablet (20 mg total) by mouth 3 (three) times daily., Disp: 30 each, Rfl: 1 .  Cholecalciferol 1000 UNITS capsule, Take 1,000 Units by mouth daily. , Disp: , Rfl:  .  clonazePAM (KLONOPIN) 0.25 MG disintegrating tablet, Take 0.25 mg by mouth daily. , Disp: , Rfl: 1 .  cyanocobalamin 1000 MCG tablet, Take 1,000 mcg by mouth daily. , Disp: , Rfl:  .  DULoxetine (CYMBALTA) 30 MG capsule, Take 30 mg by mouth daily., Disp: , Rfl:  .   famotidine (PEPCID) 20 MG tablet, Take 1 tablet (20 mg total) by mouth 2 (two) times daily., Disp: 60 tablet, Rfl: 0 .  Flaxseed, Linseed, (FLAX SEEDS PO), Take 1 capsule by mouth daily., Disp: , Rfl:  .  Fremanezumab-vfrm (AJOVY) 225 MG/1.5ML SOSY, Inject into the skin., Disp: , Rfl:  .  gabapentin (NEURONTIN) 300 MG capsule, Take 1 capsule (300 mg total) by mouth 2 (two) times daily., Disp: 60 capsule, Rfl: 0 .  HYDROcodone-acetaminophen (NORCO) 10-325 MG tablet, Take 1 tablet by mouth every 4 (four) hours as needed., Disp: 180 tablet, Rfl: 0 .  lamoTRIgine (LAMICTAL) 200 MG tablet, Take 200 mg by mouth daily., Disp: , Rfl: 1 .  lisdexamfetamine (VYVANSE) 50 MG capsule, Take 1 capsule (50 mg total) by mouth daily., Disp: 30 capsule, Rfl: 0 .  MULTIPLE VITAMINS-MINERALS PO, Take 1 tablet by mouth daily. , Disp: , Rfl:  .  Omega-3 Fatty Acids (FISH OIL) 1000 MG CAPS, Take 1 capsule by mouth daily. , Disp: , Rfl:  .  Polyethylene Glycol 3350 (MIRALAX PO), Take 17 g by mouth daily. , Disp: , Rfl:  .  promethazine (PHENERGAN) 25 MG suppository, Place 1 suppository (25 mg total) rectally every 6 (six) hours as needed for nausea., Disp: 12 suppository, Rfl: 1 .  promethazine (PHENERGAN) 25 MG tablet, TAKE 1 TABLET (25 MG TOTAL) BY MOUTH EVERY 6 (SIX) HOURS AS NEEDED FOR NAUSEA., Disp: , Rfl: 0 .  sertraline (ZOLOFT) 100 MG tablet, Take 100 mg by mouth 2 (  two) times daily. , Disp: , Rfl:  .  SUMAtriptan (IMITREX) 100 MG tablet, TAKE 1 AT HEADACHE ONSET, MAY REPEAT AFTER 2 HOURS, NO MORE THAN 2 IN A DAY,NO MORE THAN 4 IN A WEEK, Disp: , Rfl:  .  Tetrahydrozoline HCl (EYE DROPS OP), Apply 1 drop to eye 2 (two) times daily. , Disp: , Rfl:  .  topiramate (TOPAMAX) 25 MG tablet, Take 20-100 mg by mouth 2 (two) times daily. Take 25 mg by mouth in the morning and 100 mg by mouth at bedtime., Disp: , Rfl:  .  traZODone (DESYREL) 50 MG tablet, Take 100 mg by mouth at bedtime as needed for sleep., Disp: , Rfl: 1 .   topiramate (TOPAMAX) 50 MG tablet, TAKE 50 MG IN THE MORNING AND 100 MG NIGHTLY., Disp: , Rfl: 4  Review of Systems  Constitutional: Negative.   Respiratory: Negative.   Cardiovascular: Negative.   Musculoskeletal: Positive for arthralgias, back pain, myalgias and neck stiffness.  Neurological: Positive for weakness and numbness.    Social History   Tobacco Use  . Smoking status: Never Smoker  . Smokeless tobacco: Never Used  Substance Use Topics  . Alcohol use: No    Alcohol/week: 0.0 oz   Objective:   Wt 172 lb (78 kg)   BMI 29.52 kg/m  Vitals:   02/04/18 1324  Weight: 172 lb (78 kg)     Physical Exam  Constitutional: She appears well-developed and well-nourished. No distress.  Neck: Neck supple. No JVD present. Muscular tenderness present. No spinous process tenderness present. Carotid bruit is not present. Decreased range of motion present. No tracheal deviation present. No thyromegaly present.  Cardiovascular: Normal rate, regular rhythm and normal heart sounds. Exam reveals no gallop and no friction rub.  No murmur heard. Pulmonary/Chest: Effort normal and breath sounds normal. No respiratory distress. She has no wheezes. She has no rales.  Lymphadenopathy:    She has no cervical adenopathy.  Skin: She is not diaphoretic.  Vitals reviewed.  CLINICAL DATA:  57 year old female with right arm pain and numbness upon waking today. Associated nausea. Some pain radiating to the chest. Fall 1 week ago.  EXAM: MRI CERVICAL SPINE WITHOUT CONTRAST  TECHNIQUE: Multiplanar, multisequence MR imaging of the cervical spine was performed. No intravenous contrast was administered.  COMPARISON:  Brain MRI and intracranial MRA today reported separately.  FINDINGS: Alignment: Preserved cervical lordosis.  Vertebrae: No marrow edema or evidence of acute osseous abnormality. Visualized bone marrow signal is within normal limits.  Cord: Spinal cord signal is within  normal limits at all visualized levels.  Posterior Fossa, vertebral arteries, paraspinal tissues:  Cervicomedullary junction is within normal limits. Small 10 millimeter T2 hyperintense right thyroid lobe nodule which does not meet consensus criteria for ultrasound follow-up. Preserved major vascular flow voids in the neck. Negative visible neck soft tissues and lung apices.  Disc levels:  C2-C3:  Borderline to mild facet hypertrophy.  No stenosis.  C3-C4: Moderate left facet hypertrophy. Mild disc bulge with small central disc protrusion. No spinal stenosis. Mild left C4 foraminal stenosis.  C4-C5: Mild to moderate facet hypertrophy. Minimal disc bulge and endplate spurring. No spinal stenosis. Mild left and mild to moderate right C5 foraminal stenosis.  C5-C6: Disc space loss with circumferential disc bulge and endplate spurring. Broad-based posterior component. Mild facet hypertrophy. Borderline to mild spinal stenosis. No cord mass effect. Moderate left and moderate to severe right C6 foraminal stenosis.  C6-C7: Minimal disc bulge and endplate  spurring. Mild facet and ligament flavum hypertrophy. No spinal stenosis. No convincing foraminal stenosis.  C7-T1: Mild left and moderate to severe. Mild endplate right facet hypertrophy spurring. Borderline to mild right C8 foraminal stenosis.  T1-T2: Mild facet hypertrophy.  Otherwise negative.  IMPRESSION: 1.  No acute osseous abnormality. 2. 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 3. Multifactorial moderate or severe neural foraminal stenosis at the right C5, right greater than left C6 nerve levels.   Electronically Signed   By: Genevie Ann M.D.   On: 01/27/2018 20:15    Assessment & Plan:     1. Degenerative cervical spinal stenosis Noted on MRI of cervical spine. Patient will be referred back to dr. Sharlet Salina. She has  an established relationship with him for other issues. Referral placed as below. Continue current medications.  - Ambulatory referral to Physical Medicine Rehab  2. Cervical radiculopathy at C5 - Ambulatory referral to Physical Medicine Rehab  3. Cervical disc herniation - Ambulatory referral to Physical Medicine Rehab  4. Sacroiliac joint dysfunction Stable. Diagnosis pulled for medication refill. Continue current medical treatment plan. - HYDROcodone-acetaminophen (NORCO) 10-325 MG tablet; Take 1 tablet by mouth every 4 (four) hours as needed.  Dispense: 180 tablet; Refill: 0  5. Achilles tendonitis, bilateral Stable. Diagnosis pulled for medication refill. Continue current medical treatment plan. - HYDROcodone-acetaminophen (NORCO) 10-325 MG tablet; Take 1 tablet by mouth every 4 (four) hours as needed.  Dispense: 180 tablet; Refill: 0       Mar Daring, PA-C  [facility]

## 2018-02-04 NOTE — Patient Instructions (Signed)

## 2018-02-24 DIAGNOSIS — M5030 Other cervical disc degeneration, unspecified cervical region: Secondary | ICD-10-CM | POA: Diagnosis not present

## 2018-02-24 DIAGNOSIS — M5412 Radiculopathy, cervical region: Secondary | ICD-10-CM | POA: Diagnosis not present

## 2018-03-18 DIAGNOSIS — M5489 Other dorsalgia: Secondary | ICD-10-CM | POA: Diagnosis not present

## 2018-03-18 DIAGNOSIS — R251 Tremor, unspecified: Secondary | ICD-10-CM | POA: Insufficient documentation

## 2018-03-18 DIAGNOSIS — R519 Headache, unspecified: Secondary | ICD-10-CM | POA: Insufficient documentation

## 2018-03-18 DIAGNOSIS — H5711 Ocular pain, right eye: Secondary | ICD-10-CM | POA: Diagnosis not present

## 2018-03-18 DIAGNOSIS — R51 Headache: Secondary | ICD-10-CM | POA: Diagnosis not present

## 2018-03-18 DIAGNOSIS — G479 Sleep disorder, unspecified: Secondary | ICD-10-CM | POA: Diagnosis not present

## 2018-03-30 DIAGNOSIS — M5030 Other cervical disc degeneration, unspecified cervical region: Secondary | ICD-10-CM | POA: Diagnosis not present

## 2018-03-30 DIAGNOSIS — M5412 Radiculopathy, cervical region: Secondary | ICD-10-CM | POA: Diagnosis not present

## 2018-04-03 ENCOUNTER — Other Ambulatory Visit: Payer: Self-pay | Admitting: Physician Assistant

## 2018-04-03 DIAGNOSIS — M533 Sacrococcygeal disorders, not elsewhere classified: Secondary | ICD-10-CM

## 2018-04-03 DIAGNOSIS — M7661 Achilles tendinitis, right leg: Secondary | ICD-10-CM

## 2018-04-03 DIAGNOSIS — M7662 Achilles tendinitis, left leg: Principal | ICD-10-CM

## 2018-04-03 MED ORDER — HYDROCODONE-ACETAMINOPHEN 10-325 MG PO TABS: 1.0000 | ORAL_TABLET | ORAL | 0 refills | Status: DC | PRN

## 2018-04-03 NOTE — Telephone Encounter (Signed)
N[facility] reviewed. Rx sent in. 

## 2018-04-06 ENCOUNTER — Other Ambulatory Visit: Payer: Self-pay | Admitting: Physician Assistant

## 2018-04-06 DIAGNOSIS — Z1231 Encounter for screening mammogram for malignant neoplasm of breast: Secondary | ICD-10-CM

## 2018-04-08 DIAGNOSIS — F5081 Binge eating disorder: Secondary | ICD-10-CM | POA: Diagnosis not present

## 2018-04-08 DIAGNOSIS — R69 Illness, unspecified: Secondary | ICD-10-CM | POA: Diagnosis not present

## 2018-04-08 DIAGNOSIS — F4312 Post-traumatic stress disorder, chronic: Secondary | ICD-10-CM | POA: Diagnosis not present

## 2018-04-08 DIAGNOSIS — F411 Generalized anxiety disorder: Secondary | ICD-10-CM | POA: Diagnosis not present

## 2018-04-16 ENCOUNTER — Encounter: Payer: Self-pay | Admitting: Physician Assistant

## 2018-04-16 ENCOUNTER — Ambulatory Visit (INDEPENDENT_AMBULATORY_CARE_PROVIDER_SITE_OTHER): Payer: Medicare HMO | Admitting: Physician Assistant

## 2018-04-16 VITALS — BP 110/60 | HR 74 | Temp 98.2°F | Resp 16 | Wt 164.0 lb

## 2018-04-16 DIAGNOSIS — S0010XA Contusion of unspecified eyelid and periocular area, initial encounter: Secondary | ICD-10-CM

## 2018-04-16 DIAGNOSIS — W19XXXA Unspecified fall, initial encounter: Secondary | ICD-10-CM | POA: Diagnosis not present

## 2018-04-16 NOTE — Progress Notes (Signed)
Patient: Krystal Simpson Female    DOB: 1961-05-20   57 y.o.   MRN: [id number] Visit Date: 04/16/2018  Today's Provider: Mar Daring, PA-C   Chief Complaint  Patient presents with  . Fall   Subjective:    Fall  The accident occurred more than 1 week ago (on 04/07/18). The fall occurred while walking (She was walking her dog at night and and her toe got stuck ). The point of impact was the head, face and right knee (her head is tender on the right side ofher forehead). Pain location: rightside of her upper abdomen hurts. The pain is at a severity of 4/10. The pain is mild. The symptoms are aggravated by movement. She has tried acetaminophen for the symptoms.      Allergies  Allergen Reactions  . Ciprofloxacin     Other reaction(s): Joint Pains  . Tetanus Toxoids Hives     Current Outpatient Medications:  .  azelastine (ASTELIN) 0.1 % nasal spray, Place 2 sprays into both nostrils 2 (two) times daily. Use in each nostril as directed, Disp: , Rfl:  .  Cholecalciferol 1000 UNITS capsule, Take 1,000 Units by mouth daily. , Disp: , Rfl:  .  clonazePAM (KLONOPIN) 0.25 MG disintegrating tablet, Take 0.25 mg by mouth daily. , Disp: , Rfl: 1 .  cyanocobalamin 1000 MCG tablet, Take 1,000 mcg by mouth daily. , Disp: , Rfl:  .  DULoxetine (CYMBALTA) 30 MG capsule, Take 30 mg by mouth daily., Disp: , Rfl:  .  famotidine (PEPCID) 20 MG tablet, Take 1 tablet (20 mg total) by mouth 2 (two) times daily., Disp: 60 tablet, Rfl: 0 .  Flaxseed, Linseed, (FLAX SEEDS PO), Take 1 capsule by mouth daily., Disp: , Rfl:  .  Fremanezumab-vfrm (AJOVY) 225 MG/1.5ML SOSY, Inject into the skin., Disp: , Rfl:  .  gabapentin (NEURONTIN) 300 MG capsule, Take 1 capsule (300 mg total) by mouth 2 (two) times daily., Disp: 60 capsule, Rfl: 0 .  HYDROcodone-acetaminophen (NORCO) 10-325 MG tablet, Take 1 tablet by mouth every 4 (four) hours as needed., Disp: 180 tablet, Rfl: 0 .  lamoTRIgine (LAMICTAL) 200  MG tablet, Take 200 mg by mouth daily., Disp: , Rfl: 1 .  lisdexamfetamine (VYVANSE) 50 MG capsule, Take 1 capsule (50 mg total) by mouth daily., Disp: 30 capsule, Rfl: 0 .  MULTIPLE VITAMINS-MINERALS PO, Take 1 tablet by mouth daily. , Disp: , Rfl:  .  Omega-3 Fatty Acids (FISH OIL) 1000 MG CAPS, Take 1 capsule by mouth daily. , Disp: , Rfl:  .  Polyethylene Glycol 3350 (MIRALAX PO), Take 17 g by mouth daily. , Disp: , Rfl:  .  promethazine (PHENERGAN) 25 MG suppository, Place 1 suppository (25 mg total) rectally every 6 (six) hours as needed for nausea., Disp: 12 suppository, Rfl: 1 .  promethazine (PHENERGAN) 25 MG tablet, TAKE 1 TABLET (25 MG TOTAL) BY MOUTH EVERY 6 (SIX) HOURS AS NEEDED FOR NAUSEA., Disp: , Rfl: 0 .  sertraline (ZOLOFT) 100 MG tablet, Take 100 mg by mouth 2 (two) times daily. , Disp: , Rfl:  .  SUMAtriptan (IMITREX) 100 MG tablet, TAKE 1 AT HEADACHE ONSET, MAY REPEAT AFTER 2 HOURS, NO MORE THAN 2 IN A DAY,NO MORE THAN 4 IN A WEEK, Disp: , Rfl:  .  topiramate (TOPAMAX) 25 MG tablet, Take 20-100 mg by mouth 2 (two) times daily. Take 25 mg by mouth in the morning and 100 mg by  mouth at bedtime., Disp: , Rfl:  .  topiramate (TOPAMAX) 50 MG tablet, TAKE 50 MG IN THE MORNING AND 100 MG NIGHTLY., Disp: , Rfl: 4 .  traZODone (DESYREL) 50 MG tablet, Take 100 mg by mouth at bedtime as needed for sleep., Disp: , Rfl: 1 .  atorvastatin (LIPITOR) 10 MG tablet, Take 1 tablet (10 mg total) by mouth daily. (Patient not taking: Reported on 04/16/2018), Disp: 30 tablet, Rfl: 11 .  Tetrahydrozoline HCl (EYE DROPS OP), Apply 1 drop to eye 2 (two) times daily. , Disp: , Rfl:   Review of Systems  Constitutional: Negative.   Respiratory: Negative.   Cardiovascular: Negative.   Musculoskeletal: Positive for arthralgias and neck stiffness. Negative for back pain.  Neurological: Negative.     Social History   Tobacco Use  . Smoking status: Never Smoker  . Smokeless tobacco: Never Used    Substance Use Topics  . Alcohol use: No    Alcohol/week: 0.0 oz   Objective:   BP 110/60 (BP Location: Left Arm, Patient Position: Sitting, Cuff Size: Normal)   Pulse 74   Temp 98.2 F (36.8 C) (Oral)   Resp 16   Wt 164 lb (74.4 kg)   BMI 28.15 kg/m  Vitals:   04/16/18 1356  BP: 110/60  Pulse: 74  Resp: 16  Temp: 98.2 F (36.8 C)  TempSrc: Oral  Weight: 164 lb (74.4 kg)     Physical Exam  Constitutional: She appears well-developed and well-nourished. No distress.  HENT:  Head: Normocephalic. Head is with abrasion and with contusion.    Neck: Normal range of motion. Neck supple.  Cardiovascular: Normal rate, regular rhythm and normal heart sounds. Exam reveals no gallop and no friction rub.  No murmur heard. Pulmonary/Chest: Effort normal and breath sounds normal. No respiratory distress. She has no wheezes. She has no rales. She exhibits tenderness. She exhibits no deformity and no retraction.    Skin: She is not diaphoretic.  Vitals reviewed.      Assessment & Plan:     1. Fall, initial encounter [address] on area of asphalt that was uneven in a Architect zone. Doing well. Some mild contusions and abrasion to the chin. Seems to be healing well without issue. May continue anti-inflammatory prn. Advised to apply moist heat to eyebrow. Call if symptoms worsen.   2. Contusion of eyebrow, initial encounter See above medical treatment plan.        Mar Daring, PA-C  [facility]

## 2018-04-24 ENCOUNTER — Telehealth: Payer: Self-pay

## 2018-04-24 ENCOUNTER — Ambulatory Visit
Admission: RE | Admit: 2018-04-24 | Discharge: 2018-04-24 | Disposition: A | Discharge status: Home / Self Care | Payer: Medicare HMO | Source: Ambulatory Visit | Attending: Physician Assistant | Admitting: Physician Assistant

## 2018-04-24 DIAGNOSIS — Z1231 Encounter for screening mammogram for malignant neoplasm of breast: Secondary | ICD-10-CM | POA: Insufficient documentation

## 2018-04-24 NOTE — Telephone Encounter (Signed)
Patient advised as directed below.  Thanks,  -Joseline 

## 2018-04-24 NOTE — Telephone Encounter (Signed)
-----   Message from Mar Daring, [place] sent at 04/24/2018  1:41 PM EDT ----- Normal mammogram. Repeat screening in one year.

## 2018-05-06 DIAGNOSIS — M5412 Radiculopathy, cervical region: Secondary | ICD-10-CM | POA: Diagnosis not present

## 2018-05-06 DIAGNOSIS — M5030 Other cervical disc degeneration, unspecified cervical region: Secondary | ICD-10-CM | POA: Diagnosis not present

## 2018-06-01 ENCOUNTER — Encounter: Payer: Self-pay | Admitting: Physician Assistant

## 2018-06-01 ENCOUNTER — Ambulatory Visit
Admission: RE | Admit: 2018-06-01 | Discharge: 2018-06-01 | Disposition: A | Discharge status: Home / Self Care | Payer: Medicare HMO | Source: Ambulatory Visit | Attending: Physician Assistant | Admitting: Physician Assistant

## 2018-06-01 ENCOUNTER — Ambulatory Visit (INDEPENDENT_AMBULATORY_CARE_PROVIDER_SITE_OTHER): Payer: Medicare HMO | Admitting: Physician Assistant

## 2018-06-01 VITALS — BP 130/70 | HR 110 | Temp 98.0°F | Resp 16 | Wt 159.2 lb

## 2018-06-01 DIAGNOSIS — Z23 Encounter for immunization: Secondary | ICD-10-CM

## 2018-06-01 DIAGNOSIS — M545 Low back pain, unspecified: Secondary | ICD-10-CM

## 2018-06-01 DIAGNOSIS — M1711 Unilateral primary osteoarthritis, right knee: Secondary | ICD-10-CM | POA: Diagnosis not present

## 2018-06-01 DIAGNOSIS — M533 Sacrococcygeal disorders, not elsewhere classified: Secondary | ICD-10-CM | POA: Diagnosis not present

## 2018-06-01 DIAGNOSIS — M7661 Achilles tendinitis, right leg: Secondary | ICD-10-CM | POA: Diagnosis not present

## 2018-06-01 DIAGNOSIS — M7662 Achilles tendinitis, left leg: Secondary | ICD-10-CM | POA: Diagnosis not present

## 2018-06-01 DIAGNOSIS — R110 Nausea: Secondary | ICD-10-CM | POA: Diagnosis not present

## 2018-06-01 DIAGNOSIS — S8991XA Unspecified injury of right lower leg, initial encounter: Secondary | ICD-10-CM | POA: Diagnosis not present

## 2018-06-01 DIAGNOSIS — M5136 Other intervertebral disc degeneration, lumbar region: Secondary | ICD-10-CM | POA: Insufficient documentation

## 2018-06-01 DIAGNOSIS — S3992XA Unspecified injury of lower back, initial encounter: Secondary | ICD-10-CM | POA: Diagnosis not present

## 2018-06-01 DIAGNOSIS — W19XXXA Unspecified fall, initial encounter: Secondary | ICD-10-CM | POA: Diagnosis not present

## 2018-06-01 DIAGNOSIS — M25561 Pain in right knee: Secondary | ICD-10-CM | POA: Diagnosis not present

## 2018-06-01 MED ORDER — MELOXICAM 7.5 MG PO TABS: 7.5000 mg | ORAL_TABLET | Freq: Every day | ORAL | 0 refills | Status: DC

## 2018-06-01 MED ORDER — PROMETHAZINE HCL 25 MG RE SUPP: 25.0000 mg | Freq: Four times a day (QID) | RECTAL | 1 refills | Status: DC | PRN

## 2018-06-01 MED ORDER — BACLOFEN 10 MG PO TABS: 10.0000 mg | ORAL_TABLET | Freq: Three times a day (TID) | ORAL | 0 refills | Status: DC

## 2018-06-01 MED ORDER — HYDROCODONE-ACETAMINOPHEN 10-325 MG PO TABS
1.0000 | ORAL_TABLET | ORAL | 0 refills | Status: DC | PRN
Start: 2018-06-01 — End: 2018-08-03

## 2018-06-01 NOTE — Progress Notes (Signed)
Patient: Krystal Simpson Female    DOB: 07/12/1961   57 y.o.   MRN: [id number] Visit Date: 06/01/2018  Today's Provider: Mar Daring, PA-C   Chief Complaint  Patient presents with  . Back Pain  . Fall   Subjective:    HPI Patient here today with c/o back pain. Patient reports that she fell last Sunday. She reports that she was feeling dizzy after standing and grabbed ahold on the [place]. She had gotten up to go to the bathroom and that's when she fell. She reports she lost consciousness. She is hurting in her lower back. She reports that she started back on Baclofen, IBU. She reports that she has a lot of the Hydrocodone-Acetaminophen but has been taking 2 tables 3 times a day since last Sunday after the fall. She also taking Gabapentin. She has chronic back and neck pain. She is scheduled to see Dr. Sharlet Salina tomorrow for her neck.      Allergies  Allergen Reactions  . Ciprofloxacin     Other reaction(s): Joint Pains  . Tetanus Toxoids Hives     Current Outpatient Medications:  .  azelastine (ASTELIN) 0.1 % nasal spray, Place 2 sprays into both nostrils 2 (two) times daily. Use in each nostril as directed, Disp: , Rfl:  .  Cholecalciferol 1000 UNITS capsule, Take 1,000 Units by mouth daily. , Disp: , Rfl:  .  clonazePAM (KLONOPIN) 0.25 MG disintegrating tablet, Take 0.25 mg by mouth daily. , Disp: , Rfl: 1 .  cyanocobalamin 1000 MCG tablet, Take 1,000 mcg by mouth daily. , Disp: , Rfl:  .  DULoxetine (CYMBALTA) 30 MG capsule, Take 30 mg by mouth daily., Disp: , Rfl:  .  Flaxseed, Linseed, (FLAX SEEDS PO), Take 1 capsule by mouth daily., Disp: , Rfl:  .  gabapentin (NEURONTIN) 300 MG capsule, Take 1 capsule (300 mg total) by mouth 2 (two) times daily., Disp: 60 capsule, Rfl: 0 .  HYDROcodone-acetaminophen (NORCO) 10-325 MG tablet, Take 1 tablet by mouth every 4 (four) hours as needed., Disp: 180 tablet, Rfl: 0 .  lamoTRIgine (LAMICTAL) 200 MG tablet, Take 200 mg by  mouth daily., Disp: , Rfl: 1 .  lisdexamfetamine (VYVANSE) 50 MG capsule, Take 1 capsule (50 mg total) by mouth daily., Disp: 30 capsule, Rfl: 0 .  MULTIPLE VITAMINS-MINERALS PO, Take 1 tablet by mouth daily. , Disp: , Rfl:  .  Omega-3 Fatty Acids (FISH OIL) 1000 MG CAPS, Take 1 capsule by mouth daily. , Disp: , Rfl:  .  Polyethylene Glycol 3350 (MIRALAX PO), Take 17 g by mouth daily. , Disp: , Rfl:  .  promethazine (PHENERGAN) 25 MG tablet, TAKE 1 TABLET (25 MG TOTAL) BY MOUTH EVERY 6 (SIX) HOURS AS NEEDED FOR NAUSEA., Disp: , Rfl: 0 .  sertraline (ZOLOFT) 100 MG tablet, Take 100 mg by mouth 2 (two) times daily. , Disp: , Rfl:  .  SUMAtriptan (IMITREX) 100 MG tablet, TAKE 1 AT HEADACHE ONSET, MAY REPEAT AFTER 2 HOURS, NO MORE THAN 2 IN A DAY,NO MORE THAN 4 IN A WEEK, Disp: , Rfl:  .  Tetrahydrozoline HCl (EYE DROPS OP), Apply 1 drop to eye 2 (two) times daily. , Disp: , Rfl:  .  topiramate (TOPAMAX) 25 MG tablet, Take 20-100 mg by mouth 2 (two) times daily. Take 25 mg by mouth in the morning and 100 mg by mouth at bedtime., Disp: , Rfl:  .  topiramate (TOPAMAX) 50 MG  tablet, TAKE 50 MG IN THE MORNING AND 100 MG NIGHTLY., Disp: , Rfl: 4 .  traZODone (DESYREL) 50 MG tablet, Take 100 mg by mouth at bedtime as needed for sleep., Disp: , Rfl: 1 .  atorvastatin (LIPITOR) 10 MG tablet, Take 1 tablet (10 mg total) by mouth daily. (Patient not taking: Reported on 04/16/2018), Disp: 30 tablet, Rfl: 11 .  famotidine (PEPCID) 20 MG tablet, Take 1 tablet (20 mg total) by mouth 2 (two) times daily. (Patient not taking: Reported on 06/01/2018), Disp: 60 tablet, Rfl: 0 .  Fremanezumab-vfrm (AJOVY) 225 MG/1.5ML SOSY, Inject into the skin., Disp: , Rfl:  .  promethazine (PHENERGAN) 25 MG suppository, Place 1 suppository (25 mg total) rectally every 6 (six) hours as needed for nausea., Disp: 12 suppository, Rfl: 1  Review of Systems  Constitutional: Negative.   Respiratory: Negative.   Cardiovascular: Negative.     Musculoskeletal: Positive for arthralgias, back pain, gait problem, myalgias and neck pain.  Skin: Negative.   Neurological: Positive for weakness and numbness.    Social History   Tobacco Use  . Smoking status: Never Smoker  . Smokeless tobacco: Never Used  Substance Use Topics  . Alcohol use: No    Alcohol/week: 0.0 standard drinks   Objective:   BP 130/70 (BP Location: Left Arm, Patient Position: Sitting, Cuff Size: Normal)   Pulse (!) 110   Temp 98 F (36.7 C) (Oral)   Resp 16   Wt 159 lb 3.2 oz (72.2 kg)   SpO2 97%   BMI 27.33 kg/m  Vitals:   06/01/18 1547  BP: 130/70  Pulse: (!) 110  Resp: 16  Temp: 98 F (36.7 C)  TempSrc: Oral  SpO2: 97%  Weight: 159 lb 3.2 oz (72.2 kg)     Physical Exam  Constitutional: She appears well-developed and well-nourished. No distress.  Neck: Normal range of motion. Neck supple.  Cardiovascular: Normal rate, regular rhythm and normal heart sounds. Exam reveals no gallop and no friction rub.  No murmur heard. Pulmonary/Chest: Effort normal and breath sounds normal. No respiratory distress. She has no wheezes. She has no rales.  Musculoskeletal:       Lumbar back: She exhibits decreased range of motion, tenderness, bony tenderness, pain and spasm.  Exam limited due to pain  Skin: She is not diaphoretic.  Vitals reviewed.  CLINICAL DATA:  Fall 1 week prior. Low back pain. No radiculopathy.  EXAM: LUMBAR SPINE - COMPLETE 4+ VIEW  COMPARISON:  09/29/2015 CT abdomen/pelvis  FINDINGS: This report assumes 5 non rib-bearing lumbar vertebrae.  There is slight cortical irregularity anteriorly in the L1 vertebral body suggestive of a mild acute L1 vertebral compression fracture. No additional fractures.  Mild multilevel lumbar degenerative disc disease, most prominent at L2-3. No spondylolisthesis. No appreciable facet arthropathy. No aggressive appearing focal osseous lesions.  IMPRESSION: 1. Slight cortical  irregularity anteriorly in the L1 vertebral body suggestive of a mild acute L1 vertebral compression fracture. 2. Mild multilevel lumbar degenerative disc disease. No spondylolisthesis.  These results will be called to the ordering clinician or representative by the Radiologist Assistant, and communication documented in the PACS or zVision Dashboard.   Electronically Signed   By: Ilona Sorrel M.D.   On: 06/02/2018 08:56     Assessment & Plan:     1. Fall, initial encounter Will get imaging as below to r/o compression fracture. Continue meloxicam for inflammation, Norco for pain, and baclofen for muscle spasm. She is to see Dr.  Chasnis tomorrow for [facility] in the cervical spine.  - DG Lumbar Spine Complete; Future - DG Knee Complete 4 Views Right; Future - meloxicam (MOBIC) 7.5 MG tablet; Take 1 tablet (7.5 mg total) by mouth daily.  Dispense: 30 tablet; Refill: 0  2. Acute bilateral low back pain without sciatica See above medical treatment plan. - HYDROcodone-acetaminophen (NORCO) 10-325 MG tablet; Take 1 tablet by mouth every 4 (four) hours as needed.  Dispense: 180 tablet; Refill: 0 - DG Lumbar Spine Complete; Future - baclofen (LIORESAL) 10 MG tablet; Take 1 tablet (10 mg total) by mouth 3 (three) times daily.  Dispense: 30 each; Refill: 0 - meloxicam (MOBIC) 7.5 MG tablet; Take 1 tablet (7.5 mg total) by mouth daily.  Dispense: 30 tablet; Refill: 0  3. Achilles tendonitis, bilateral Stable. Diagnosis pulled for medication refill. Continue current medical treatment plan. - HYDROcodone-acetaminophen (NORCO) 10-325 MG tablet; Take 1 tablet by mouth every 4 (four) hours as needed.  Dispense: 180 tablet; Refill: 0  4. Sacroiliac joint dysfunction Stable. Diagnosis pulled for medication refill. Continue current medical treatment plan. - HYDROcodone-acetaminophen (NORCO) 10-325 MG tablet; Take 1 tablet by mouth every 4 (four) hours as needed.  Dispense: 180 tablet; Refill: 0  5.  Nausea Stable. Diagnosis pulled for medication refill. Continue current medical treatment plan. - promethazine (PHENERGAN) 25 MG suppository; Place 1 suppository (25 mg total) rectally every 6 (six) hours as needed for nausea.  Dispense: 12 suppository; Refill: 1  6. Need for influenza vaccination Flu vaccine given today without complication. Patient sat upright for 15 minutes to check for adverse reaction before being released. - Flu Vaccine QUAD 36+ mos IM       Mar Daring, PA-C  [facility]

## 2018-06-01 NOTE — Patient Instructions (Signed)

## 2018-06-02 ENCOUNTER — Telehealth: Payer: Self-pay

## 2018-06-02 NOTE — Telephone Encounter (Signed)
Patient advised as below.  

## 2018-06-02 NOTE — Telephone Encounter (Signed)
[facility] Radiologist from [place] calling to give report. Reports that the Lumbar Spine Complete shows Compression Fracture on L1.  Thanks  -Shyra Emile

## 2018-06-02 NOTE — Telephone Encounter (Signed)
Result note created and also result sent to Dr. Sharlet Salina.

## 2018-06-02 NOTE — Telephone Encounter (Signed)
-----   Message from Mar Daring, PA-C sent at 06/02/2018 10:10 AM EDT ----- Right knee is normal. No fractures. Mild OA noted. Knee cap is most likely bruised (bone bruise) and these can take 6-12 weeks to fully heal.

## 2018-06-04 DIAGNOSIS — M7662 Achilles tendinitis, left leg: Secondary | ICD-10-CM | POA: Diagnosis not present

## 2018-06-04 DIAGNOSIS — M545 Low back pain: Secondary | ICD-10-CM | POA: Diagnosis not present

## 2018-06-04 DIAGNOSIS — M533 Sacrococcygeal disorders, not elsewhere classified: Secondary | ICD-10-CM | POA: Diagnosis not present

## 2018-06-04 DIAGNOSIS — W19XXXA Unspecified fall, initial encounter: Secondary | ICD-10-CM | POA: Diagnosis not present

## 2018-06-04 DIAGNOSIS — M5412 Radiculopathy, cervical region: Secondary | ICD-10-CM | POA: Diagnosis not present

## 2018-06-04 DIAGNOSIS — R110 Nausea: Secondary | ICD-10-CM | POA: Diagnosis not present

## 2018-06-04 DIAGNOSIS — M5030 Other cervical disc degeneration, unspecified cervical region: Secondary | ICD-10-CM | POA: Diagnosis not present

## 2018-06-04 DIAGNOSIS — Z23 Encounter for immunization: Secondary | ICD-10-CM | POA: Diagnosis not present

## 2018-06-04 DIAGNOSIS — M7661 Achilles tendinitis, right leg: Secondary | ICD-10-CM | POA: Diagnosis not present

## 2018-06-25 ENCOUNTER — Telehealth: Payer: Self-pay | Admitting: Physician Assistant

## 2018-06-25 NOTE — Telephone Encounter (Signed)
Patient was advised of results and that the notes of imaging were shared to them by [facility].

## 2018-06-25 NOTE — Telephone Encounter (Signed)
Pt needing to know results on her lower back Xrays.  Pt got the results on knee. Just needing the results on back now.  Pt also needing Xrays to be sent to Dr. Margette Fast and Loree Fee Meeler at [facility] for pt's upcomming appt.  Thanks, [employer]

## 2018-06-26 ENCOUNTER — Other Ambulatory Visit: Payer: Self-pay | Admitting: Physician Assistant

## 2018-06-29 ENCOUNTER — Other Ambulatory Visit: Payer: Self-pay | Admitting: Physician Assistant

## 2018-06-29 DIAGNOSIS — M545 Low back pain, unspecified: Secondary | ICD-10-CM

## 2018-06-29 MED ORDER — BACLOFEN 10 MG PO TABS: 10.0000 mg | ORAL_TABLET | Freq: Three times a day (TID) | ORAL | 5 refills | Status: DC

## 2018-07-03 DIAGNOSIS — M5412 Radiculopathy, cervical region: Secondary | ICD-10-CM | POA: Diagnosis not present

## 2018-07-03 DIAGNOSIS — M5030 Other cervical disc degeneration, unspecified cervical region: Secondary | ICD-10-CM | POA: Diagnosis not present

## 2018-07-03 DIAGNOSIS — M62838 Other muscle spasm: Secondary | ICD-10-CM | POA: Diagnosis not present

## 2018-07-08 DIAGNOSIS — F5081 Binge eating disorder: Secondary | ICD-10-CM | POA: Diagnosis not present

## 2018-07-08 DIAGNOSIS — R69 Illness, unspecified: Secondary | ICD-10-CM | POA: Diagnosis not present

## 2018-07-08 DIAGNOSIS — F4312 Post-traumatic stress disorder, chronic: Secondary | ICD-10-CM | POA: Diagnosis not present

## 2018-07-08 DIAGNOSIS — F411 Generalized anxiety disorder: Secondary | ICD-10-CM | POA: Diagnosis not present

## 2018-07-09 DIAGNOSIS — D2261 Melanocytic nevi of right upper limb, including shoulder: Secondary | ICD-10-CM | POA: Diagnosis not present

## 2018-07-09 DIAGNOSIS — D2262 Melanocytic nevi of left upper limb, including shoulder: Secondary | ICD-10-CM | POA: Diagnosis not present

## 2018-07-09 DIAGNOSIS — D485 Neoplasm of uncertain behavior of skin: Secondary | ICD-10-CM | POA: Diagnosis not present

## 2018-07-09 DIAGNOSIS — Z08 Encounter for follow-up examination after completed treatment for malignant neoplasm: Secondary | ICD-10-CM | POA: Diagnosis not present

## 2018-07-09 DIAGNOSIS — D2272 Melanocytic nevi of left lower limb, including hip: Secondary | ICD-10-CM | POA: Diagnosis not present

## 2018-07-09 DIAGNOSIS — Z872 Personal history of diseases of the skin and subcutaneous tissue: Secondary | ICD-10-CM | POA: Diagnosis not present

## 2018-07-09 DIAGNOSIS — Z09 Encounter for follow-up examination after completed treatment for conditions other than malignant neoplasm: Secondary | ICD-10-CM | POA: Diagnosis not present

## 2018-07-09 DIAGNOSIS — Z85828 Personal history of other malignant neoplasm of skin: Secondary | ICD-10-CM | POA: Diagnosis not present

## 2018-07-09 DIAGNOSIS — L218 Other seborrheic dermatitis: Secondary | ICD-10-CM | POA: Diagnosis not present

## 2018-07-09 DIAGNOSIS — L7211 Pilar cyst: Secondary | ICD-10-CM | POA: Diagnosis not present

## 2018-08-03 ENCOUNTER — Other Ambulatory Visit: Payer: Self-pay | Admitting: Physician Assistant

## 2018-08-03 DIAGNOSIS — M7662 Achilles tendinitis, left leg: Principal | ICD-10-CM

## 2018-08-03 DIAGNOSIS — F1190 Opioid use, unspecified, uncomplicated: Secondary | ICD-10-CM

## 2018-08-03 DIAGNOSIS — M545 Low back pain, unspecified: Secondary | ICD-10-CM

## 2018-08-03 DIAGNOSIS — M533 Sacrococcygeal disorders, not elsewhere classified: Secondary | ICD-10-CM

## 2018-08-03 DIAGNOSIS — M7661 Achilles tendinitis, right leg: Secondary | ICD-10-CM

## 2018-08-03 MED ORDER — NALOXONE HCL 4 MG/0.1ML NA LIQD: 0.4000 mg | Freq: Once | NASAL | 1 refills | Status: AC

## 2018-08-03 MED ORDER — HYDROCODONE-ACETAMINOPHEN 10-325 MG PO TABS: 1.0000 | ORAL_TABLET | ORAL | 0 refills | Status: DC | PRN

## 2018-09-29 ENCOUNTER — Other Ambulatory Visit: Payer: Self-pay | Admitting: Physician Assistant

## 2018-09-29 DIAGNOSIS — M533 Sacrococcygeal disorders, not elsewhere classified: Secondary | ICD-10-CM

## 2018-09-29 DIAGNOSIS — M545 Low back pain, unspecified: Secondary | ICD-10-CM

## 2018-09-29 DIAGNOSIS — M7661 Achilles tendinitis, right leg: Secondary | ICD-10-CM

## 2018-09-29 DIAGNOSIS — M7662 Achilles tendinitis, left leg: Principal | ICD-10-CM

## 2018-09-29 MED ORDER — HYDROCODONE-ACETAMINOPHEN 10-325 MG PO TABS: 1.0000 | ORAL_TABLET | ORAL | 0 refills | Status: DC | PRN

## 2018-09-29 NOTE — Telephone Encounter (Signed)
[place] reviewed.  Rx refilled

## 2018-10-07 DIAGNOSIS — M62830 Muscle spasm of back: Secondary | ICD-10-CM | POA: Diagnosis not present

## 2018-10-07 DIAGNOSIS — M5030 Other cervical disc degeneration, unspecified cervical region: Secondary | ICD-10-CM | POA: Diagnosis not present

## 2018-10-07 DIAGNOSIS — M62838 Other muscle spasm: Secondary | ICD-10-CM | POA: Diagnosis not present

## 2018-10-07 DIAGNOSIS — M5412 Radiculopathy, cervical region: Secondary | ICD-10-CM | POA: Diagnosis not present

## 2018-10-07 DIAGNOSIS — M5489 Other dorsalgia: Secondary | ICD-10-CM | POA: Diagnosis not present

## 2018-10-07 DIAGNOSIS — M546 Pain in thoracic spine: Secondary | ICD-10-CM | POA: Diagnosis not present

## 2018-10-13 DIAGNOSIS — F411 Generalized anxiety disorder: Secondary | ICD-10-CM | POA: Diagnosis not present

## 2018-10-13 DIAGNOSIS — F5081 Binge eating disorder: Secondary | ICD-10-CM | POA: Diagnosis not present

## 2018-10-13 DIAGNOSIS — R69 Illness, unspecified: Secondary | ICD-10-CM | POA: Diagnosis not present

## 2018-10-13 DIAGNOSIS — F4312 Post-traumatic stress disorder, chronic: Secondary | ICD-10-CM | POA: Diagnosis not present

## 2018-10-16 ENCOUNTER — Ambulatory Visit (INDEPENDENT_AMBULATORY_CARE_PROVIDER_SITE_OTHER): Payer: Medicare HMO

## 2018-10-16 VITALS — BP 122/64 | Temp 99.1°F | Ht 64.0 in | Wt 167.8 lb

## 2018-10-16 DIAGNOSIS — Z0000 Encounter for general adult medical examination without abnormal findings: Secondary | ICD-10-CM

## 2018-10-16 NOTE — Progress Notes (Signed)
Subjective:   Krystal Simpson is a 58 y.o. female who presents for Medicare Annual (Subsequent) preventive examination.  Review of Systems:  N/A  Cardiac Risk Factors include: advanced age (>66men, >14 women)     Objective:     Vitals: BP 122/64 (BP Location: Right Arm)   Temp 99.1 F (37.3 C) (Oral)   Ht 5\' 4"  (1.626 m)   Wt 167 lb 12.8 oz (76.1 kg)   BMI 28.80 kg/m   Body mass index is 28.8 kg/m.  Advanced Directives 10/16/2018 10/16/2018 01/27/2018 10/06/2017 06/03/2017 10/04/2016 06/28/2016  Does Patient Have a Medical Advance Directive? No No No No No No No  Does patient want to make changes to medical advance directive? Yes (MAU/Ambulatory/Procedural Areas - Information given) Yes (MAU/Ambulatory/Procedural Areas - Information given) - - - - No - Patient declined  Copy of Healthcare Power of Attorney in Chart? - - - - - - No - copy requested  Would patient like information on creating a medical advance directive? - No - Patient declined No - Patient declined Yes (MAU/Ambulatory/Procedural Areas - Information given) No - Patient declined - Yes Higher education careers adviser given    Tobacco Social History   Tobacco Use  Smoking Status Never Smoker  Smokeless Tobacco Never Used     Counseling given: Not Answered   Clinical Intake:  Pre-visit preparation completed: Yes  Pain : No/denies pain Pain Score: 4  Pain Type: Chronic pain Pain Location: Neck Pain Orientation: Left Pain Descriptors / Indicators: Aching, Dull Pain Frequency: Constant     Nutritional Status: BMI 25 -29 Overweight Nutritional Risks: None Diabetes: No  How often do you need to have someone help you when you read instructions, pamphlets, or other written materials from your doctor or pharmacy?: 1 - Never  Interpreter Needed?: No  Information entered by :: [facility], LPN  Past Medical History:  Diagnosis Date  . Abnormal Pap smear of cervix   . Anxiety   . 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 Depression   . History of kidney stones    Past Surgical History:  Procedure Laterality Date  . BASAL CELL CARCINOMA EXCISION  2014  . BASAL CELL CARCINOMA EXCISION  09/20/2015   Dr. Evorn Gong, left mid forearm  . MOHS SURGERY  2015   top of head Dr. Shelda Jakes at [facility]  . SQUAMOUS CELL CARCINOMA EXCISION  2014   Family History  Problem Relation Age of Onset  . Emphysema Mother   . Lung cancer Father   . Healthy Sister   . Heart attack Sister 50  . Heart attack Maternal Grandmother   . Breast cancer Sister    Social History   Socioeconomic History  . Marital status: Married    Spouse name: Abbe Amsterdam  . Number of children: 2  . Years of education: some college  . Highest education level: Some college, no degree  Occupational History  . Occupation: disabled  Social Needs  . Financial resource strain: Not hard at all  . Food insecurity:    Worry: Never true    Inability: Never true  . Transportation needs:    Medical: No    Non-medical: No  Tobacco Use  . Smoking status: Never Smoker  . Smokeless tobacco: Never Used  Substance and Sexual Activity  . Alcohol use: No    Alcohol/week: 0.0 standard drinks  . Drug use: No  . Sexual activity: Not Currently  Lifestyle  . Physical activity:    Days per week: 0 days    Minutes per session: 0 min  . Stress: Very much  Relationships  . Social connections:    Talks on phone: Patient refused    Gets together: Patient refused    Attends religious service: Patient refused    Active member of club or organization: Patient refused    Attends meetings of clubs or organizations: Patient refused    Relationship status: Patient refused  Other Topics Concern  . 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     Outpatient Encounter Medications as of 10/16/2018  Medication Sig  . azelastine (ASTELIN) 0.1 % nasal spray Place 2 sprays into both nostrils 2 (two)  times daily. Use in each nostril as directed  . baclofen (LIORESAL) 10 MG tablet Take 1 tablet (10 mg total) by mouth 3 (three) times daily.  . Cholecalciferol 1000 UNITS capsule Take 1,000 Units by mouth daily.   . clonazePAM (KLONOPIN) 0.25 MG disintegrating tablet Take 0.25 mg by mouth daily.   . cyanocobalamin 1000 MCG tablet Take 1,000 mcg by mouth daily.   . diclofenac (VOLTAREN) 75 MG EC tablet Take 75 mg by mouth 2 (two) times daily.  . DULoxetine (CYMBALTA) 30 MG capsule Take 90 mg by mouth daily.   . Flaxseed, Linseed, (FLAX SEEDS PO) Take 1 capsule by mouth daily.  . Fremanezumab-vfrm (AJOVY) 225 MG/1.5ML SOSY Inject into the skin.  Marland Kitchen gabapentin (NEURONTIN) 300 MG capsule Take 1 capsule (300 mg total) by mouth 2 (two) times daily. (Patient taking differently: Take 300 mg by mouth 3 (three) times daily. )  . HYDROcodone-acetaminophen (NORCO) 10-325 MG tablet Take 1 tablet by mouth every 4 (four) hours as needed. (Patient taking differently: Take 2 tablets by mouth every 4 (four) hours as needed. )  . lamoTRIgine (LAMICTAL) 200 MG tablet Take 200 mg by mouth daily.  Marland Kitchen lisdexamfetamine (VYVANSE) 50 MG capsule Take 1 capsule (50 mg total) by mouth daily.  . meloxicam (MOBIC) 15 MG tablet Take 15 mg by mouth daily.  . MULTIPLE VITAMINS-MINERALS PO Take 1 tablet by mouth daily.   . Omega-3 Fatty Acids (FISH OIL) 1000 MG CAPS Take 1 capsule by mouth daily.   Marland Kitchen OVER THE COUNTER MEDICATION Numbing cream for neck pain  . pantoprazole (PROTONIX) 40 MG tablet Take 40 mg by mouth daily.  . Polyethylene Glycol 3350 (MIRALAX PO) Take 17 g by mouth daily.   . promethazine (PHENERGAN) 25 MG suppository Place 1 suppository (25 mg total) rectally every 6 (six) hours as needed for nausea.  . sertraline (ZOLOFT) 100 MG tablet Take 100 mg by mouth 2 (two) times daily.   . SUMAtriptan (IMITREX) 100 MG tablet TAKE 1 AT HEADACHE ONSET, MAY REPEAT AFTER 2 HOURS, NO MORE THAN 2 IN A DAY,NO MORE THAN 4 IN A WEEK   . terbinafine (LAMISIL) 250 MG tablet Take 250 mg by mouth daily.  . Tetrahydrozoline HCl (EYE DROPS OP) Apply 1 drop to eye 2 (two) times daily.   Marland Kitchen topiramate (TOPAMAX) 100 MG tablet Take 100 mg by mouth daily.  . traMADol (ULTRAM) 50 MG tablet Take by mouth at bedtime as needed.  . traZODone (DESYREL) 50 MG tablet Take 100 mg by mouth at bedtime as needed for sleep.  Marland Kitchen atorvastatin (LIPITOR) 10 MG tablet Take 1 tablet (10 mg total) by mouth daily. (Patient not taking: Reported on 04/16/2018)  .  famotidine (PEPCID) 20 MG tablet Take 1 tablet (20 mg total) by mouth 2 (two) times daily. (Patient not taking: Reported on 06/01/2018)  . meloxicam (MOBIC) 7.5 MG tablet Take 1 tablet (7.5 mg total) by mouth daily. (Patient not taking: Reported on 10/16/2018)  . topiramate (TOPAMAX) 25 MG tablet Take 20-100 mg by mouth 2 (two) times daily. Take 25 mg by mouth in the morning and 100 mg by mouth at bedtime.  . topiramate (TOPAMAX) 50 MG tablet TAKE 50 MG IN THE MORNING AND 100 MG NIGHTLY.   No facility-administered encounter medications on file as of 10/16/2018.     Activities of Daily Living In your present state of health, do you have any difficulty performing the following activities: 10/16/2018 01/27/2018  Hearing? N N  Vision? N N  Difficulty concentrating or making decisions? N N  Walking or climbing stairs? N N  Dressing or bathing? N N  Doing errands, shopping? N N  Preparing Food and eating ? N -  Using the Toilet? N -  In the past six months, have you accidently leaked urine? N -  Do you have problems with loss of bowel control? N -  Managing your Medications? N -  Managing your Finances? N -  Housekeeping or managing your Housekeeping? N -  Some recent data might be hidden    Patient Care Team: Mar Daring, PA-C as PCP - General (Family Medicine) Arelia Sneddon, Silex as Consulting Physician (Optometry) Manley Mason, Donivan Scull, MD as Referring Physician (Dermatology) Oneta Rack, MD as Consulting Physician (Dermatology) Chauncey Mann, MD as Referring Physician (Psychiatry) Ruthy Dick, LCSW as Social Worker (Professional Counselor) Sharlet Salina, MD as Referring Physician (Physical Medicine and Rehabilitation) Meeler, Sherren Kerns, FNP (Family Medicine) Anabel Bene, MD as Referring Physician (Neurology) Jannifer Franklin, NP as Nurse Practitioner (Neurology) Carloyn Manner, MD as Referring Physician (Otolaryngology)    Assessment:   This is a routine wellness examination for Britlee.  Exercise Activities and Dietary recommendations Current Exercise Habits: The patient does not participate in regular exercise at present  Goals    . Exercise 150 minutes per week (moderate activity)     10/06/17- Pt to increase the amount of exercise done. Pt to start walking for 3-4 days a week for at least 30 minutes at a time.     Marland Kitchen LIFESTYLE - DECREASE FALLS RISK     Recommend to remove any items from the home that may cause slips or trips.       Fall Risk Fall Risk  10/16/2018 06/01/2018 10/06/2017 06/03/2017 12/26/2016  Falls in the past year? 1 Yes Yes Yes No  Number falls in past yr: 1 2 or more 1 2 or more -  Comment - - - walking dog; tripped on roots -  Injury with Fall? 1 Yes No No -  Comment - Lower back - - -  Risk Factor Category  - - - - -  Risk for fall due to : - - - - Other (Comment)  Follow up Falls prevention discussed - Falls prevention discussed - -   FALL RISK PREVENTION PERTAINING TO THE HOME:  Any stairs in or around the home WITH handrails? No  Home free of loose throw rugs in walkways, pet beds, electrical cords, etc? Yes  Adequate lighting in your home to reduce risk of falls? Yes   ASSISTIVE DEVICES UTILIZED TO PREVENT FALLS:  Life alert? No  Use of a cane, walker or  w/c? No  Grab bars in the bathroom? No  Shower chair or bench in shower? Yes  Elevated toilet seat or a handicapped toilet? No    TIMED UP AND  GO:  Was the test performed? No .    Depression Screen PHQ 2/9 Scores 10/16/2018 10/06/2017 12/26/2016 10/04/2016  PHQ - 2 Score 3 6 0 2  PHQ- 9 Score 14 18 - 13  Exception Documentation - - Other- indicate reason in comment box -     Cognitive Function: Declines today.      6CIT Screen 10/06/2017  What Year? 0 points  What month? 0 points  What time? 0 points  Count back from 20 0 points  Months in reverse 0 points  Repeat phrase 2 points  Total Score 2    Immunization History  Administered Date(s) Administered  . Influenza, Seasonal, Injecte, Preservative Fre 06/24/2015  . Influenza,inj,Quad PF,6+ Mos 10/04/2016, 06/02/2017, 06/04/2018     Flu Vaccine: Up to date    Screening Tests Health Maintenance  Topic Date Due  . MAMMOGRAM  04/24/2020  . PAP SMEAR-Modifier  10/09/2020  . COLONOSCOPY  08/17/2023  . TETANUS/TDAP  10/04/2026  . INFLUENZA VACCINE  Completed  . Hepatitis C Screening  Completed  . HIV Screening  Completed    Cancer Screenings:  Colorectal Screening: Completed 08/16/13. Repeat every 10 years.  Mammogram: Completed 04/24/18. Repeat every year.  Lung Cancer Screening: (Low Dose CT Chest recommended if Age 47-80 years, 30 pack-year currently smoking OR have quit w/in 15years.) does not qualify.    Additional Screening:  Hepatitis C Screening: Up to date  Vision Screening: Recommended annual ophthalmology exams for early detection of glaucoma and other disorders of the eye.  Dental Screening: Recommended annual dental exams for proper oral hygiene  Community Resource Referral:  CRR required this visit?  No       Plan:  I have personally reviewed and addressed the Medicare Annual Wellness questionnaire and have noted the following in the patient's chart:  A. Medical and social history B. Use of alcohol, tobacco or illicit drugs  C. Current medications and supplements D. Functional ability and status E.  Nutritional status F.   Physical activity G. Advance directives H. List of other physicians I.  Hospitalizations, surgeries, and ER visits in previous 12 months J.  Brooks such as hearing and vision if needed, cognitive and depression L. Referrals and appointments - none  In addition, I have reviewed and discussed with patient certain preventive protocols, quality metrics, and best practice recommendations. A written personalized care plan for preventive services as well as general preventive health recommendations were provided to patient.  See attached scanned questionnaire for additional information.   Signed,  Fabio Neighbors, LPN Nurse Health Advisor   Nurse Recommendations: None.

## 2018-10-16 NOTE — Patient Instructions (Addendum)
[name] , Thank you for taking time to come for your Medicare Wellness Visit. I appreciate your ongoing commitment to your health goals. Please review the following plan we discussed and let me know if I can assist you in the future.   Screening recommendations/referrals: Colonoscopy: Up to date, due 07/2023 Mammogram: Up to date, due 04/2020 Bone Density: Not required until age 59 Recommended yearly ophthalmology/optometry visit for glaucoma screening and checkup Recommended yearly dental visit for hygiene and checkup  Vaccinations: Influenza vaccine: Up to date Pneumococcal vaccine: Not required until age 54 Shingles vaccine: Not required until age 49  Advanced directives: Advance directive discussed with you today. I have provided a copy for you to complete at home and have notarized. Once this is complete please bring a copy in to our office so we can scan it into your chart.  Conditions/risks identified: Fall risk prevention; Continue trying to increase exercise to 3 days a week for at least 30 minutes at a time.   Next appointment: 10/28/18 @ 10:00 AM with Fenton Malling.  Preventive Care 40-64 Years, Female Preventive care refers to lifestyle choices and visits with your health care provider that can promote health and wellness. What does preventive care include?  A yearly physical exam. This is also called an annual well check.  Dental exams once or twice a year.  Routine eye exams. Ask your health care provider how often you should have your eyes checked.  Personal lifestyle choices, including:  Daily care of your teeth and gums.  Regular physical activity.  Eating a healthy diet.  Avoiding tobacco and drug use.  Limiting alcohol use.  Practicing safe sex.  Taking low-dose aspirin daily starting at age 60.  Taking vitamin and mineral supplements as recommended by your health care provider. What happens during an annual well check? The services and  screenings done by your health care provider during your annual well check will depend on your age, overall health, lifestyle risk factors, and family history of disease. Counseling  Your health care provider may ask you questions about your:  Alcohol use.  Tobacco use.  Drug use.  Emotional well-being.  Home and relationship well-being.  Sexual activity.  Eating habits.  Work and work Statistician.  Method of birth control.  Menstrual cycle.  Pregnancy history. Screening  You may have the following tests or measurements:  Height, weight, and BMI.  Blood pressure.  Lipid and cholesterol levels. These may be checked every 5 years, or more frequently if you are over 18 years old.  Skin check.  Lung cancer screening. You may have this screening every year starting at age 33 if you have a 30-pack-year history of smoking and currently smoke or have quit within the past 15 years.  Fecal occult blood test (FOBT) of the stool. You may have this test every year starting at age 27.  Flexible sigmoidoscopy or colonoscopy. You may have a sigmoidoscopy every 5 years or a colonoscopy every 10 years starting at age 74.  Hepatitis C blood test.  Hepatitis B blood test.  Sexually transmitted disease (STD) testing.  Diabetes screening. This is done by checking your blood sugar (glucose) after you have not eaten for a while (fasting). You may have this done every 1-3 years.  Mammogram. This may be done every 1-2 years. Talk to your health care provider about when you should start having regular mammograms. This may depend on whether you have a family history of breast cancer.  BRCA-related  cancer screening. This may be done if you have a family history of breast, ovarian, tubal, or peritoneal cancers.  Pelvic exam and Pap test. This may be done every 3 years starting at age 21. Starting at age 30, this may be done every 5 years if you have a Pap test in combination with an HPV  test.  Bone density scan. This is done to screen for osteoporosis. You may have this scan if you are at high risk for osteoporosis. Discuss your test results, treatment options, and if necessary, the need for more tests with your health care provider. Vaccines  Your health care provider may recommend certain vaccines, such as:  Influenza vaccine. This is recommended every year.  Tetanus, diphtheria, and acellular pertussis (Tdap, Td) vaccine. You may need a Td booster every 10 years.  Zoster vaccine. You may need this after age 60.  Pneumococcal 13-valent conjugate (PCV13) vaccine. You may need this if you have certain conditions and were not previously vaccinated.  Pneumococcal polysaccharide (PPSV23) vaccine. You may need one or two doses if you smoke cigarettes or if you have certain conditions. Talk to your health care provider about which screenings and vaccines you need and how often you need them. This information is not intended to replace advice given to you by your health care provider. Make sure you discuss any questions you have with your health care provider. Document Released: 10/06/2015 Document Revised: 05/29/2016 Document Reviewed: 07/11/2015 Elsevier Interactive Patient Education  2017 [employer]    Fall Prevention in the Home Falls can cause injuries. They can happen to people of all ages. There are many things you can do to make your home safe and to help prevent falls. What can I do on the outside of my home?  Regularly fix the edges of walkways and driveways and fix any cracks.  Remove anything that might make you trip as you walk through a door, such as a raised step or threshold.  Trim any bushes or trees on the path to your home.  Use bright outdoor lighting.  Clear any walking paths of anything that might make someone trip, such as rocks or tools.  Regularly check to see if handrails are loose or broken. Make sure that both sides of any steps have  handrails.  Any raised decks and porches should have guardrails on the edges.  Have any leaves, snow, or ice cleared regularly.  Use sand or salt on walking paths during winter.  Clean up any spills in your garage right away. This includes oil or grease spills. What can I do in the bathroom?  Use night lights.  Install grab bars by the toilet and in the tub and shower. Do not use towel bars as grab bars.  Use non-skid mats or decals in the tub or shower.  If you need to sit down in the shower, use a plastic, non-slip stool.  Keep the floor dry. Clean up any water that spills on the floor as soon as it happens.  Remove soap buildup in the tub or shower regularly.  Attach bath mats securely with double-sided non-slip rug tape.  Do not have throw rugs and other things on the floor that can make you trip. What can I do in the bedroom?  Use night lights.  Make sure that you have a light by your bed that is easy to reach.  Do not use any sheets or blankets that are too big for your bed. They should   not hang down onto the floor.  Have a firm chair that has side arms. You can use this for support while you get dressed.  Do not have throw rugs and other things on the floor that can make you trip. What can I do in the kitchen?  Clean up any spills right away.  Avoid walking on wet floors.  Keep items that you use a lot in easy-to-reach places.  If you need to reach something above you, use a strong step stool that has a grab bar.  Keep electrical cords out of the way.  Do not use floor polish or wax that makes floors slippery. If you must use wax, use non-skid floor wax.  Do not have throw rugs and other things on the floor that can make you trip. What can I do with my stairs?  Do not leave any items on the stairs.  Make sure that there are handrails on both sides of the stairs and use them. Fix handrails that are broken or loose. Make sure that handrails are as long as  the stairways.  Check any carpeting to make sure that it is firmly attached to the stairs. Fix any carpet that is loose or worn.  Avoid having throw rugs at the top or bottom of the stairs. If you do have throw rugs, attach them to the floor with carpet tape.  Make sure that you have a light switch at the top of the stairs and the bottom of the stairs. If you do not have them, ask someone to add them for you. What else can I do to help prevent falls?  Wear shoes that:  Do not have high heels.  Have rubber bottoms.  Are comfortable and fit you well.  Are closed at the toe. Do not wear sandals.  If you use a stepladder:  Make sure that it is fully opened. Do not climb a closed stepladder.  Make sure that both sides of the stepladder are locked into place.  Ask someone to hold it for you, if possible.  Clearly mark and make sure that you can see:  Any grab bars or handrails.  First and last steps.  Where the edge of each step is.  Use tools that help you move around (mobility aids) if they are needed. These include:  Canes.  Walkers.  Scooters.  Crutches.  Turn on the lights when you go into a dark area. Replace any light bulbs as soon as they burn out.  Set up your furniture so you have a clear path. Avoid moving your furniture around.  If any of your floors are uneven, fix them.  If there are any pets around you, be aware of where they are.  Review your medicines with your doctor. Some medicines can make you feel dizzy. This can increase your chance of falling. Ask your doctor what other things that you can do to help prevent falls. This information is not intended to replace advice given to you by your health care provider. Make sure you discuss any questions you have with your health care provider. Document Released: 07/06/2009 Document Revised: 02/15/2016 Document Reviewed: 10/14/2014 Elsevier Interactive Patient Education  2017 [employer].

## 2018-10-27 NOTE — Progress Notes (Signed)
Patient: Krystal Simpson, Female    DOB: 11-22-1960, 58 y.o.   MRN: [id number] Visit Date: 10/28/2018  Today's Provider: Mar Daring, PA-C   Chief Complaint  Patient presents with  . Annual Exam   Subjective:    Patient was seen on 10/16/2018 for AWV with [facility].  Complete Physical GERTRUE WILLETTE is a 58 y.o. female. She feels well. She reports exercising some. She reports she is sleeping fairly well.  Pap:10/09/2017 Normal. Repeat in 3-5 years. Colonoscopy:08/16/2013 Dr. Candace Cruise Repeat in 10 years.  Does have acute complaint also of left sided knee pain. No known injury. Denies any swelling. Pain is at worst with stairs and squatting. Pain feels deep behind knee.  -----------------------------------------------------------   Review of Systems  Constitutional: Negative.   HENT: Negative.   Eyes: Positive for photophobia ("Migraines").  Respiratory: Positive for chest tightness ("little "stress").   Cardiovascular: Negative.   Gastrointestinal: Positive for nausea and vomiting ("with Migraines").  Endocrine: Positive for heat intolerance and polydipsia ("Medicines I take").  Genitourinary: Negative.   Musculoskeletal: Positive for arthralgias, back pain and myalgias.       Knee pain, left side.  Skin: Negative.   Allergic/Immunologic: Negative.   Neurological: Positive for dizziness ("same") and headaches ("same').  Hematological: Negative.   Psychiatric/Behavioral: Positive for agitation ("at times') and sleep disturbance. The patient is nervous/anxious.     Social History   Socioeconomic History  . Marital status: Married    Spouse name: Abbe Amsterdam  . Number of children: 2  . Years of education: some college  . Highest education level: Some college, no degree  Occupational History  . Occupation: disabled  Social Needs  . Financial resource strain: Not hard at all  . Food insecurity:    Worry: Never true    Inability: Never true  .  Transportation needs:    Medical: No    Non-medical: No  Tobacco Use  . Smoking status: Never Smoker  . Smokeless tobacco: Never Used  Substance and Sexual Activity  . Alcohol use: No    Alcohol/week: 0.0 standard drinks  . Drug use: No  . Sexual activity: Not Currently  Lifestyle  . Physical activity:    Days per week: 0 days    Minutes per session: 0 min  . Stress: Very much  Relationships  . Social connections:    Talks on phone: Patient refused    Gets together: Patient refused    Attends religious service: Patient refused    Active member of club or organization: Patient refused    Attends meetings of clubs or organizations: Patient refused    Relationship status: Patient refused  . Intimate partner violence:    Fear of current or ex partner: Patient refused    Emotionally abused: Patient refused    Physically abused: Patient refused    Forced sexual activity: Patient refused  Other Topics Concern  . 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     Past Medical History:  Diagnosis Date  . Abnormal Pap smear of cervix   . Anxiety   . 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 Depression   . History of kidney stones      Patient Active Problem List   Diagnosis Date Noted  . TIA (transient ischemic attack) 01/27/2018  . Abnormal Pap smear of cervix 10/09/2017  .  Sacroiliac joint dysfunction 01/09/2017  . BPPV (benign paroxysmal positional vertigo) 11/14/2015  . Abnormal weight loss 10/04/2015  . Back ache 08/12/2015  . Right leg pain 08/07/2015  . Sciatica of right side 07/19/2015  . Low back pain 06/27/2015  . Abnormal thyroid stimulating hormone (TSH) level 06/01/2015  . Absolute anemia 06/01/2015  . Anxiety 06/01/2015  . Basal cell carcinoma 06/01/2015  . Colon polyp 06/01/2015  . Clinical depression 06/01/2015  . Esophageal reflux 06/01/2015  . Headache, migraine 06/01/2015    . Adiposity 06/01/2015  . Hypercholesterolemia without hypertriglyceridemia 06/01/2015  . Squamous cell carcinoma 06/01/2015  . Avitaminosis D 06/01/2015  . Achilles tendonitis, bilateral 10/12/2014  . Disordered sleep 03/03/2014  . Sleep disorder 03/03/2014  . Obesity, unspecified 03/03/2014    Past Surgical History:  Procedure Laterality Date  . BASAL CELL CARCINOMA EXCISION  2014  . BASAL CELL CARCINOMA EXCISION  09/20/2015   Dr. Evorn Gong, left mid forearm  . MOHS SURGERY  2015   top of head Dr. Shelda Jakes [facility]  . SQUAMOUS CELL CARCINOMA EXCISION  2014    Her family history includes Breast cancer in her sister; Emphysema in her mother; Healthy in her sister; Heart attack in her maternal grandmother; Heart attack (age of onset: 11) in her sister; Lung cancer in her father.   Current Outpatient Medications:  .  azelastine (ASTELIN) 0.1 % nasal spray, Place 2 sprays into both nostrils 2 (two) times daily. Use in each nostril as directed, Disp: , Rfl:  .  baclofen (LIORESAL) 10 MG tablet, Take 1 tablet (10 mg total) by mouth 3 (three) times daily., Disp: 30 each, Rfl: 5 .  Cholecalciferol 1000 UNITS capsule, Take 1,000 Units by mouth daily. , Disp: , Rfl:  .  clonazePAM (KLONOPIN) 0.25 MG disintegrating tablet, Take 0.25 mg by mouth daily. , Disp: , Rfl: 1 .  cyanocobalamin 1000 MCG tablet, Take 1,000 mcg by mouth daily. , Disp: , Rfl:  .  diclofenac (VOLTAREN) 75 MG EC tablet, Take 75 mg by mouth 2 (two) times daily., Disp: , Rfl:  .  DULoxetine (CYMBALTA) 30 MG capsule, Take 90 mg by mouth daily. , Disp: , Rfl:  .  Flaxseed, Linseed, (FLAX SEEDS PO), Take 1 capsule by mouth daily., Disp: , Rfl:  .  Fremanezumab-vfrm (AJOVY) 225 MG/1.5ML SOSY, Inject into the skin., Disp: , Rfl:  .  gabapentin (NEURONTIN) 300 MG capsule, Take 1 capsule (300 mg total) by mouth 2 (two) times daily. (Patient taking differently: Take 300 mg by mouth 3 (three) times daily. ), Disp: 60 capsule, Rfl: 0 .   HYDROcodone-acetaminophen (NORCO) 10-325 MG tablet, Take 1 tablet by mouth every 4 (four) hours as needed. (Patient taking differently: Take 2 tablets by mouth every 4 (four) hours as needed. ), Disp: 180 tablet, Rfl: 0 .  lamoTRIgine (LAMICTAL) 200 MG tablet, Take 200 mg by mouth daily., Disp: , Rfl: 1 .  lisdexamfetamine (VYVANSE) 50 MG capsule, Take 1 capsule (50 mg total) by mouth daily., Disp: 30 capsule, Rfl: 0 .  meloxicam (MOBIC) 15 MG tablet, Take 15 mg by mouth daily., Disp: , Rfl:  .  MULTIPLE VITAMINS-MINERALS PO, Take 1 tablet by mouth daily. , Disp: , Rfl:  .  Omega-3 Fatty Acids (FISH OIL) 1000 MG CAPS, Take 1 capsule by mouth daily. , Disp: , Rfl:  .  OVER THE COUNTER MEDICATION, Numbing cream for neck pain, Disp: , Rfl:  .  pantoprazole (PROTONIX) 40 MG tablet, Take 40  mg by mouth daily., Disp: , Rfl:  .  Polyethylene Glycol 3350 (MIRALAX PO), Take 17 g by mouth daily. , Disp: , Rfl:  .  promethazine (PHENERGAN) 25 MG suppository, Place 1 suppository (25 mg total) rectally every 6 (six) hours as needed for nausea., Disp: 12 suppository, Rfl: 1 .  sertraline (ZOLOFT) 100 MG tablet, Take 100 mg by mouth 2 (two) times daily. , Disp: , Rfl:  .  SUMAtriptan (IMITREX) 100 MG tablet, TAKE 1 AT HEADACHE ONSET, MAY REPEAT AFTER 2 HOURS, NO MORE THAN 2 IN A DAY,NO MORE THAN 4 IN A WEEK, Disp: , Rfl:  .  terbinafine (LAMISIL) 250 MG tablet, Take 250 mg by mouth daily., Disp: , Rfl:  .  Tetrahydrozoline HCl (EYE DROPS OP), Apply 1 drop to eye 2 (two) times daily. , Disp: , Rfl:  .  topiramate (TOPAMAX) 100 MG tablet, Take 100 mg by mouth daily., Disp: , Rfl:  .  traMADol (ULTRAM) 50 MG tablet, Take by mouth at bedtime as needed., Disp: , Rfl:  .  traZODone (DESYREL) 50 MG tablet, Take 100 mg by mouth at bedtime as needed for sleep., Disp: , Rfl: 1 .  atorvastatin (LIPITOR) 10 MG tablet, Take 1 tablet (10 mg total) by mouth daily. (Patient not taking: Reported on 04/16/2018), Disp: 30 tablet,  Rfl: 11 .  famotidine (PEPCID) 20 MG tablet, Take 1 tablet (20 mg total) by mouth 2 (two) times daily. (Patient not taking: Reported on 06/01/2018), Disp: 60 tablet, Rfl: 0 .  meloxicam (MOBIC) 7.5 MG tablet, Take 1 tablet (7.5 mg total) by mouth daily. (Patient not taking: Reported on 10/28/2018), Disp: 30 tablet, Rfl: 0 .  topiramate (TOPAMAX) 25 MG tablet, Take 20-100 mg by mouth 2 (two) times daily. Take 25 mg by mouth in the morning and 100 mg by mouth at bedtime., Disp: , Rfl:  .  topiramate (TOPAMAX) 50 MG tablet, TAKE 50 MG IN THE MORNING AND 100 MG NIGHTLY., Disp: , Rfl: 4  Patient Care Team: Mar Daring, PA-C as PCP - General (Family Medicine) Arelia Sneddon, [place] as Consulting Physician (Optometry) Manley Mason, Donivan Scull, MD as Referring Physician (Dermatology) Oneta Rack, MD as Consulting Physician (Dermatology) Chauncey Mann, MD as Referring Physician (Psychiatry) Ruthy Dick, LCSW as Social Worker (Professional Counselor) Sharlet Salina, MD as Referring Physician (Physical Medicine and Rehabilitation) Meeler, Sherren Kerns, FNP (Family Medicine) Anabel Bene, MD as Referring Physician (Neurology) Jannifer Franklin, NP as Nurse Practitioner (Neurology) Carloyn Manner, MD as Referring Physician (Otolaryngology)     Objective:    Vitals: BP 128/79 (BP Location: Left Arm, Patient Position: Sitting, Cuff Size: Normal)   Pulse 92   Temp 98.1 F (36.7 C) (Oral)   Resp 16   Ht 5\' 5"  (1.651 m)   Wt 162 lb 3.2 oz (73.6 kg)   BMI 26.99 kg/m   Physical Exam Vitals signs reviewed.  Constitutional:      General: She is not in acute distress.    Appearance: Normal appearance. She is well-developed and normal weight. She is not diaphoretic.  HENT:     Head: Normocephalic and atraumatic.     Right Ear: Tympanic membrane, ear canal and external ear normal.     Left Ear: Tympanic membrane, ear canal and external ear normal.     Nose: Nose normal.      Mouth/Throat:     Mouth: Mucous membranes are moist.     Pharynx: Oropharynx is clear. No  oropharyngeal exudate.  Eyes:     General: No scleral icterus.       Right eye: No discharge.        Left eye: No discharge.     Extraocular Movements: Extraocular movements intact.     Conjunctiva/sclera: Conjunctivae normal.     Pupils: Pupils are equal, round, and reactive to light.  Neck:     Musculoskeletal: Normal range of motion and neck supple.     Thyroid: No thyromegaly.     Vascular: No JVD.     Trachea: No tracheal deviation.  Cardiovascular:     Rate and Rhythm: Normal rate and regular rhythm.     Pulses: Normal pulses.     Heart sounds: Normal heart sounds. No murmur. No friction rub. No gallop.   Pulmonary:     Effort: Pulmonary effort is normal. No respiratory distress.     Breath sounds: Normal breath sounds. No wheezing or rales.  Chest:     Chest wall: No tenderness.  Abdominal:     General: Bowel sounds are normal. There is no distension.     Palpations: Abdomen is soft. There is no mass.     Tenderness: There is no abdominal tenderness. There is no guarding or rebound.  Musculoskeletal: Normal range of motion.     Left knee: She exhibits normal range of motion, no swelling, no effusion, no deformity, no erythema, normal alignment, no LCL laxity, normal patellar mobility, no bony tenderness, normal meniscus and no MCL laxity. Tenderness found. Patellar tendon tenderness noted.     Comments: Negative varus/valgus. Negative ant/post drawer. Negative lachman. Negative McMurray. Positive patellar grind, negative patellar apprehension  Lymphadenopathy:     Cervical: No cervical adenopathy.  Skin:    General: Skin is warm and dry.     Findings: No rash.  Neurological:     General: No focal deficit present.     Mental Status: She is alert and oriented to person, place, and time.     Gait: Gait normal.  Psychiatric:        Mood and Affect: Mood normal.        Behavior:  Behavior normal.        Thought Content: Thought content normal.        Judgment: Judgment normal.     Activities of Daily Living In your present state of health, do you have any difficulty performing the following activities: 10/16/2018 01/27/2018  Hearing? N N  Vision? N N  Difficulty concentrating or making decisions? N N  Walking or climbing stairs? N N  Dressing or bathing? N N  Doing errands, shopping? N N  Preparing Food and eating ? N -  Using the Toilet? N -  In the past six months, have you accidently leaked urine? N -  Do you have problems with loss of bowel control? N -  Managing your Medications? N -  Managing your Finances? N -  Housekeeping or managing your Housekeeping? N -  Some recent data might be hidden    Fall Risk Assessment Fall Risk  10/16/2018 06/01/2018 10/06/2017 06/03/2017 12/26/2016  Falls in the past year? 1 Yes Yes Yes No  Number falls in past yr: 1 2 or more 1 2 or more -  Comment - - - walking dog; tripped on roots -  Injury with Fall? 1 Yes No No -  Comment - Lower back - - -  Risk Factor Category  - - - - -  Risk  for fall due to : - - - - Other (Comment)  Follow up Falls prevention discussed - Falls prevention discussed - -     Depression Screen PHQ 2/9 Scores 10/16/2018 10/06/2017 12/26/2016 10/04/2016  PHQ - 2 Score 3 6 0 2  PHQ- 9 Score 14 18 - 13  Exception Documentation - - Other- indicate reason in comment box -    6CIT Screen 10/06/2017  What Year? 0 points  What month? 0 points  What time? 0 points  Count back from 20 0 points  Months in reverse 0 points  Repeat phrase 2 points  Total Score 2       Assessment & Plan:    Annual Physical Reviewed patient's Family Medical History Reviewed and updated list of patient's medical providers Assessment of cognitive impairment was done Assessed patient's functional ability Established a written schedule for health screening [place] Completed and  Reviewed  Exercise Activities and Dietary recommendations Goals    . Exercise 150 minutes per week (moderate activity)     10/06/17- Pt to increase the amount of exercise done. Pt to start walking for 3-4 days a week for at least 30 minutes at a time.     Marland Kitchen LIFESTYLE - DECREASE FALLS RISK     Recommend to remove any items from the home that may cause slips or trips.       Immunization History  Administered Date(s) Administered  . Influenza, Seasonal, Injecte, Preservative Fre 06/24/2015  . Influenza,inj,Quad PF,6+ Mos 10/04/2016, 06/02/2017, 06/04/2018    Health Maintenance  Topic Date Due  . MAMMOGRAM  04/24/2020  . PAP SMEAR-Modifier  10/09/2020  . COLONOSCOPY  08/17/2023  . TETANUS/TDAP  10/04/2026  . INFLUENZA VACCINE  Completed  . Hepatitis C Screening  Completed  . HIV Screening  Completed     Discussed health benefits of physical activity, and encouraged her to engage in regular exercise appropriate for her age and condition.    1. Annual physical exam Normal physical exam today. Will check labs as below and f/u pending lab results. If labs are stable and WNL she will not need to have these rechecked for one year at her next annual physical exam. She is to call the office in the meantime if she has any acute issue, questions or concerns. - CBC with Differential/Platelet - Comprehensive metabolic panel  2. Hypothyroidism, unspecified type Stable. Will check labs as below and f/u pending results. - TSH  3. Breast cancer screening Breast exam today was normal. There is  family history of breast cancer (half sister). She does perform regular self breast exams. Mammogram was ordered as below. Information for [facility] clinic was given to patient so she may schedule her mammogram at her convenience. - MM 3D SCREEN BREAST BILATERAL; Future  4. Hypercholesterolemia without hypertriglyceridemia Stable. Advised importance of patient taking atorvastatin. Advised her  to continue medication. Will check labs as below and f/u pending results. - Lipid panel  5. Hyperglycemia Diet controlled. Will check labs as below and f/u pending results. - HgB A1c  6. Patellofemoral arthralgia of left knee Suspect patellofemoral syndrome due to positive grind test and all other testing was negative. Patient is on chronic narcotics for other arthralgias and also has meloxicam. Will get xrays to r/o other bony abnormalities. Advised to brace the knee and ice after activity. Call if worsening.  - DG Knee 4 Views W/Patella Left; Future  ------------------------------------------------------------------------------------------------------------    Mar Daring, PA-C  [facility]

## 2018-10-28 ENCOUNTER — Ambulatory Visit
Admission: RE | Admit: 2018-10-28 | Discharge: 2018-10-28 | Disposition: A | Discharge status: Home / Self Care | Payer: Medicare HMO | Source: Ambulatory Visit | Attending: Physician Assistant | Admitting: Physician Assistant

## 2018-10-28 ENCOUNTER — Ambulatory Visit (INDEPENDENT_AMBULATORY_CARE_PROVIDER_SITE_OTHER): Payer: Medicare HMO | Admitting: Physician Assistant

## 2018-10-28 ENCOUNTER — Encounter: Payer: Self-pay | Admitting: Physician Assistant

## 2018-10-28 ENCOUNTER — Telehealth: Payer: Self-pay

## 2018-10-28 VITALS — BP 128/79 | HR 92 | Temp 98.1°F | Resp 16 | Ht 65.0 in | Wt 162.2 lb

## 2018-10-28 DIAGNOSIS — E039 Hypothyroidism, unspecified: Secondary | ICD-10-CM | POA: Diagnosis not present

## 2018-10-28 DIAGNOSIS — M25562 Pain in left knee: Secondary | ICD-10-CM

## 2018-10-28 DIAGNOSIS — E7800 Pure hypercholesterolemia, unspecified: Secondary | ICD-10-CM | POA: Diagnosis not present

## 2018-10-28 DIAGNOSIS — Z1239 Encounter for other screening for malignant neoplasm of breast: Secondary | ICD-10-CM | POA: Diagnosis not present

## 2018-10-28 DIAGNOSIS — R739 Hyperglycemia, unspecified: Secondary | ICD-10-CM

## 2018-10-28 DIAGNOSIS — Z0000 Encounter for general adult medical examination without abnormal findings: Secondary | ICD-10-CM

## 2018-10-28 DIAGNOSIS — M1712 Unilateral primary osteoarthritis, left knee: Secondary | ICD-10-CM | POA: Diagnosis not present

## 2018-10-28 NOTE — Patient Instructions (Addendum)
Patellofemoral Pain Syndrome  Patellofemoral pain syndrome is a condition in which the tissue (cartilage) on the underside of the kneecap (patella) softens or breaks down. This causes pain in the front of the knee. The condition is also called runner's knee or chondromalacia patella. Patellofemoral pain syndrome is most common in young adults who are active in sports. The knee is the largest joint in the body. The patella covers the front of the knee and is attached to muscles above and below the knee. The underside of the patella is covered with a smooth type of cartilage (synovium). The smooth surface helps the patella to glide easily when you move your knee. Patellofemoral pain syndrome causes swelling in the joint linings and bone surfaces in the knee. What are the causes? This condition may be caused by:  Overuse of the knee.  Poor alignment of your knee joints.  Weak leg muscles.  A direct blow to your kneecap. What increases the risk? You are more likely to develop this condition if:  You do a lot of activities that can wear down your kneecap. These include: ? Running. ? Squatting. ? Climbing stairs.  You start a new physical activity or exercise program.  You wear shoes that do not fit well.  You do not have good leg strength.  You are overweight. What are the signs or symptoms? The main symptom of this condition is knee pain. This may feel like a dull, aching pain underneath your patella, in the front of your knee. There may be a popping or cracking sound when you move your knee. Pain may get worse with:  Exercise.  Climbing stairs.  Running.  Jumping.  Squatting.  Kneeling.  Sitting for a long time.  Moving or pushing on your patella. How is this diagnosed? This condition may be diagnosed based on:  Your symptoms and medical history. You may be asked about your recent physical activities and which ones cause knee pain.  A physical exam. This may  include: ? Moving your patella back and forth. ? Checking your range of knee motion. ? Having you squat or jump to see if you have pain. ? Checking the strength of your leg muscles.  Imaging tests to confirm the diagnosis. These may include an MRI of your knee. How is this treated? This condition may be treated at home with rest, ice, compression, and elevation (RICE).  Other treatments may include:  Nonsteroidal anti-inflammatory drugs (NSAIDs).  Physical therapy to stretch and strengthen your leg muscles.  Shoe inserts (orthotics) to take stress off your knee.  A knee brace or knee support.  Adhesive tapes to the skin.  Surgery to remove damaged cartilage or move the patella to a better position. This is rare. Follow these instructions at home: If you have a shoe or brace:  Wear the shoe or brace as told by your health care provider. Remove it only as told by your health care provider.  Loosen the shoe or brace if your toes tingle, become numb, or turn cold and blue.  Keep the shoe or brace clean.  If the shoe or brace is not waterproof: ? Do not let it get wet. ? Cover it with a watertight covering when you take a bath or a shower. Managing pain, stiffness, and swelling  If directed, put ice on the painful area. ? If you have a removable shoe or brace, remove it as told by your health care provider. ? Put ice in a plastic bag. ?  Place a towel between your skin and the bag. ? Leave the ice on for 20 minutes, 2-3 times a day.  Move your toes often to avoid stiffness and to lessen swelling.  Rest your knee: ? Avoid activities that cause knee pain. ? When sitting or lying down, raise (elevate) the injured area above the level of your heart, whenever possible. General instructions  Take over-the-counter and prescription medicines only as told by your health care provider.  Use splints, braces, knee supports, or walking aids as directed by your health care  provider.  Perform stretching and strengthening exercises as told by your health care provider or physical therapist.  Do not use any products that contain nicotine or tobacco, such as cigarettes and e-cigarettes. These can delay healing. If you need help quitting, ask your health care provider.  Return to your normal activities as told by your health care provider. Ask your health care provider what activities are safe for you.  Keep all follow-up visits as told by your health care provider. This is important. Contact a health care provider if:  Your symptoms get worse.  You are not improving with home care. Summary  Patellofemoral pain syndrome is a condition in which the tissue (cartilage) on the underside of the kneecap (patella) softens or breaks down.  This condition causes swelling in the joint linings and bone surfaces in the knee. This leads to pain in the front of the knee.  This condition may be treated at home with rest, ice, compression, and elevation (RICE).  Use splints, braces, knee supports, or walking aids as directed by your health care provider. This information is not intended to replace advice given to you by your health care provider. Make sure you discuss any questions you have with your health care provider. Document Released: 08/28/2009 Document Revised: 10/20/2017 Document Reviewed: 10/20/2017 Elsevier Interactive Patient Education  2019 [employer] Maintenance for Postmenopausal Women Menopause is a normal process in which your reproductive ability comes to an end. This process happens gradually over a span of months to years, usually between the ages of 62 and 34. Menopause is complete when you have missed 12 consecutive menstrual periods. It is important to talk with your health care provider about some of the most common conditions that affect postmenopausal women, such as heart disease, cancer, and bone loss (osteoporosis). Adopting a healthy  lifestyle and getting preventive care can help to promote your health and wellness. Those actions can also lower your chances of developing some of these common conditions. What should I know about menopause? During menopause, you may experience a number of symptoms, such as:  Moderate-to-severe hot flashes.  Night sweats.  Decrease in sex drive.  Mood swings.  Headaches.  Tiredness.  Irritability.  Memory problems.  Insomnia. Choosing to treat or not to treat menopausal changes is an individual decision that you make with your health care provider. What should I know about hormone replacement therapy and supplements? Hormone therapy products are effective for treating symptoms that are associated with menopause, such as hot flashes and night sweats. Hormone replacement carries certain risks, especially as you become older. If you are thinking about using estrogen or estrogen with progestin treatments, discuss the benefits and risks with your health care provider. What should I know about heart disease and stroke? Heart disease, heart attack, and stroke become more likely as you age. This may be due, in part, to the hormonal changes that your body experiences during menopause. These  can affect how your body processes dietary fats, triglycerides, and cholesterol. Heart attack and stroke are both medical emergencies. There are many things that you can do to help prevent heart disease and stroke:  Have your blood pressure checked at least every 1-2 years. High blood pressure causes heart disease and increases the risk of stroke.  If you are 40-14 years old, ask your health care provider if you should take aspirin to prevent a heart attack or a stroke.  Do not use any tobacco products, including cigarettes, chewing tobacco, or electronic cigarettes. If you need help quitting, ask your health care provider.  It is important to eat a healthy diet and maintain a healthy weight. ? Be sure  to include plenty of vegetables, fruits, low-fat dairy products, and lean protein. ? Avoid eating foods that are high in solid fats, added sugars, or salt (sodium).  Get regular exercise. This is one of the most important things that you can do for your health. ? Try to exercise for at least 150 minutes each week. The type of exercise that you do should increase your heart rate and make you sweat. This is known as moderate-intensity exercise. ? Try to do strengthening exercises at least twice each week. Do these in addition to the moderate-intensity exercise.  Know your numbers.Ask your health care provider to check your cholesterol and your blood glucose. Continue to have your blood tested as directed by your health care provider.  What should I know about cancer screening? There are several types of cancer. Take the following steps to reduce your risk and to catch any cancer development as early as possible. Breast Cancer  Practice breast self-awareness. ? This means understanding how your breasts normally appear and feel. ? It also means doing regular breast self-exams. Let your health care provider know about any changes, no matter how small.  If you are 59 or older, have a clinician do a breast exam (clinical breast exam or CBE) every year. Depending on your age, family history, and medical history, it may be recommended that you also have a yearly breast X-ray (mammogram).  If you have a family history of breast cancer, talk with your health care provider about genetic screening.  If you are at high risk for breast cancer, talk with your health care provider about having an MRI and a mammogram every year.  Breast cancer (BRCA) gene test is recommended for women who have family members with BRCA-related cancers. Results of the assessment will determine the need for genetic counseling and BRCA1 and for BRCA2 testing. BRCA-related cancers include these types: ? Breast. This occurs in  males or females. ? Ovarian. ? Tubal. This may also be called fallopian tube cancer. ? Cancer of the abdominal or pelvic lining (peritoneal cancer). ? Prostate. ? Pancreatic. Cervical, Uterine, and Ovarian Cancer Your health care provider may recommend that you be screened regularly for cancer of the pelvic organs. These include your ovaries, uterus, and vagina. This screening involves a pelvic exam, which includes checking for microscopic changes to the surface of your cervix (Pap test).  For women ages 21-65, health care providers may recommend a pelvic exam and a Pap test every three years. For women ages 49-65, they may recommend the Pap test and pelvic exam, combined with testing for human papilloma virus (HPV), every five years. Some types of HPV increase your risk of cervical cancer. Testing for HPV may also be done on women of any age who have unclear  Pap test results.  Other health care providers may not recommend any screening for nonpregnant women who are considered low risk for pelvic cancer and have no symptoms. Ask your health care provider if a screening pelvic exam is right for you.  If you have had past treatment for cervical cancer or a condition that could lead to cancer, you need Pap tests and screening for cancer for at least 20 years after your treatment. If Pap tests have been discontinued for you, your risk factors (such as having a new sexual partner) need to be reassessed to determine if you should start having screenings again. Some women have medical problems that increase the chance of getting cervical cancer. In these cases, your health care provider may recommend that you have screening and Pap tests more often.  If you have a family history of uterine cancer or ovarian cancer, talk with your health care provider about genetic screening.  If you have vaginal bleeding after reaching menopause, tell your health care provider.  There are currently no reliable tests  available to screen for ovarian cancer. Lung Cancer Lung cancer screening is recommended for adults 82-26 years old who are at high risk for lung cancer because of a history of smoking. A yearly low-dose CT scan of the lungs is recommended if you:  Currently smoke.  Have a history of at least 30 pack-years of smoking and you currently smoke or have quit within the past 15 years. A pack-year is smoking an average of one pack of cigarettes per day for one year. Yearly screening should:  Continue until it has been 15 years since you quit.  Stop if you develop a health problem that would prevent you from having lung cancer treatment. Colorectal Cancer  This type of cancer can be detected and can often be prevented.  Routine colorectal cancer screening usually begins at age 79 and continues through age 75.  If you have risk factors for colon cancer, your health care provider may recommend that you be screened at an earlier age.  If you have a family history of colorectal cancer, talk with your health care provider about genetic screening.  Your health care provider may also recommend using home test kits to check for hidden blood in your stool.  A small camera at the end of a tube can be used to examine your colon directly (sigmoidoscopy or colonoscopy). This is done to check for the earliest forms of colorectal cancer.  Direct examination of the colon should be repeated every 5-10 years until age 43. However, if early forms of precancerous polyps or small growths are found or if you have a family history or genetic risk for colorectal cancer, you may need to be screened more often. Skin Cancer  Check your skin from head to toe regularly.  Monitor any moles. Be sure to tell your health care provider: ? About any new moles or changes in moles, especially if there is a change in a mole's shape or color. ? If you have a mole that is larger than the size of a pencil eraser.  If any of your  family members has a history of skin cancer, especially at a young age, talk with your health care provider about genetic screening.  Always use sunscreen. Apply sunscreen liberally and repeatedly throughout the day.  Whenever you are outside, protect yourself by wearing long sleeves, pants, a wide-brimmed hat, and sunglasses. What should I know about osteoporosis? Osteoporosis is a condition in  which bone destruction happens more quickly than new bone creation. After menopause, you may be at an increased risk for osteoporosis. To help prevent osteoporosis or the bone fractures that can happen because of osteoporosis, the following is recommended:  If you are 34-73 years old, get at least 1,000 mg of calcium and at least 600 mg of vitamin D per day.  If you are older than age 41 but younger than age 31, get at least 1,200 mg of calcium and at least 600 mg of vitamin D per day.  If you are older than age 83, get at least 1,200 mg of calcium and at least 800 mg of vitamin D per day. Smoking and excessive alcohol intake increase the risk of osteoporosis. Eat foods that are rich in calcium and vitamin D, and do weight-bearing exercises several times each week as directed by your health care provider. What should I know about how menopause affects my mental health? Depression may occur at any age, but it is more common as you become older. Common symptoms of depression include:  Low or sad mood.  Changes in sleep patterns.  Changes in appetite or eating patterns.  Feeling an overall lack of motivation or enjoyment of activities that you previously enjoyed.  Frequent crying spells. Talk with your health care provider if you think that you are experiencing depression. What should I know about immunizations? It is important that you get and maintain your immunizations. These include:  Tetanus, diphtheria, and pertussis (Tdap) booster vaccine.  Influenza every year before the flu season  begins.  Pneumonia vaccine.  Shingles vaccine. Your health care provider may also recommend other immunizations. This information is not intended to replace advice given to you by your health care provider. Make sure you discuss any questions you have with your health care provider. Document Released: 11/01/2005 Document Revised: 03/29/2016 Document Reviewed: 06/13/2015 Elsevier Interactive Patient Education  2019 [employer].

## 2018-10-28 NOTE — Telephone Encounter (Signed)
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 Should improve with anti-inflammatory and exercises given.

## 2018-10-28 NOTE — Telephone Encounter (Signed)
LMTCB

## 2018-10-29 LAB — CBC WITH DIFFERENTIAL/PLATELET
Basophils Absolute: 0.1 10*3/uL (ref 0.0–0.2)
Basos: 1 %
EOS (ABSOLUTE): 0.1 10*3/uL (ref 0.0–0.4)
Eos: 1 %
Hematocrit: 38.2 % (ref 34.0–46.6)
Hemoglobin: 12.9 g/dL (ref 11.1–15.9)
Immature Grans (Abs): 0 10*3/uL (ref 0.0–0.1)
Immature Granulocytes: 0 %
LYMPHS ABS: 2.7 10*3/uL (ref 0.7–3.1)
Lymphs: 39 %
MCH: 29.9 pg (ref 26.6–33.0)
MCHC: 33.8 g/dL (ref 31.5–35.7)
MCV: 88 fL (ref 79–97)
MONOS ABS: 0.6 10*3/uL (ref 0.1–0.9)
Monocytes: 9 %
NEUTROS ABS: 3.5 10*3/uL (ref 1.4–7.0)
Neutrophils: 50 %
PLATELETS: 249 10*3/uL (ref 150–450)
RBC: 4.32 x10E6/uL (ref 3.77–5.28)
RDW: 12.4 % (ref 11.7–15.4)
WBC: 7 10*3/uL (ref 3.4–10.8)

## 2018-10-29 LAB — COMPREHENSIVE METABOLIC PANEL
ALT: 11 IU/L (ref 0–32)
AST: 20 IU/L (ref 0–40)
Albumin/Globulin Ratio: 3.1 — ABNORMAL HIGH (ref 1.2–2.2)
Albumin: 4.9 g/dL (ref 3.8–4.9)
Alkaline Phosphatase: 99 IU/L (ref 39–117)
BILIRUBIN TOTAL: 0.4 mg/dL (ref 0.0–1.2)
BUN/Creatinine Ratio: 25 — ABNORMAL HIGH (ref 9–23)
BUN: 17 mg/dL (ref 6–24)
CHLORIDE: 104 mmol/L (ref 96–106)
CO2: 25 mmol/L (ref 20–29)
Calcium: 9.5 mg/dL (ref 8.7–10.2)
Creatinine, Ser: 0.68 mg/dL (ref 0.57–1.00)
GFR calc non Af Amer: 97 mL/min/{1.73_m2} (ref >59)
GFR, EST AFRICAN AMERICAN: 112 mL/min/{1.73_m2} (ref >59)
Globulin, Total: 1.6 g/dL (ref 1.5–4.5)
Glucose: 104 mg/dL — ABNORMAL HIGH (ref 65–99)
POTASSIUM: 4 mmol/L (ref 3.5–5.2)
SODIUM: 140 mmol/L (ref 134–144)
Total Protein: 6.5 g/dL (ref 6.0–8.5)

## 2018-10-29 LAB — LIPID PANEL
Chol/HDL Ratio: 3 ratio (ref 0.0–4.4)
Cholesterol, Total: 198 mg/dL (ref 100–199)
HDL: 65 mg/dL (ref >39)
LDL Calculated: 119 mg/dL — ABNORMAL HIGH (ref 0–99)
TRIGLYCERIDES: 71 mg/dL (ref 0–149)
VLDL Cholesterol Cal: 14 mg/dL (ref 5–40)

## 2018-10-29 LAB — TSH: TSH: 0.628 u[IU]/mL (ref 0.450–4.500)

## 2018-10-29 LAB — HEMOGLOBIN A1C
ESTIMATED AVERAGE GLUCOSE: 88 mg/dL
Hgb A1c MFr Bld: 4.7 % — ABNORMAL LOW (ref 4.8–5.6)

## 2018-10-29 NOTE — Telephone Encounter (Signed)
Patient was notified of results. Expressed understanding.  

## 2018-12-21 ENCOUNTER — Other Ambulatory Visit: Payer: Self-pay | Admitting: Physician Assistant

## 2018-12-21 DIAGNOSIS — M545 Low back pain, unspecified: Secondary | ICD-10-CM

## 2018-12-21 DIAGNOSIS — M7662 Achilles tendinitis, left leg: Principal | ICD-10-CM

## 2018-12-21 DIAGNOSIS — M533 Sacrococcygeal disorders, not elsewhere classified: Secondary | ICD-10-CM

## 2018-12-21 DIAGNOSIS — M7661 Achilles tendinitis, right leg: Secondary | ICD-10-CM

## 2018-12-21 MED ORDER — HYDROCODONE-ACETAMINOPHEN 10-325 MG PO TABS: 1.0000 | ORAL_TABLET | ORAL | 0 refills | Status: DC | PRN

## 2019-01-04 DIAGNOSIS — F4312 Post-traumatic stress disorder, chronic: Secondary | ICD-10-CM | POA: Diagnosis not present

## 2019-01-04 DIAGNOSIS — F411 Generalized anxiety disorder: Secondary | ICD-10-CM | POA: Diagnosis not present

## 2019-01-04 DIAGNOSIS — F5081 Binge eating disorder: Secondary | ICD-10-CM | POA: Diagnosis not present

## 2019-01-04 DIAGNOSIS — R69 Illness, unspecified: Secondary | ICD-10-CM | POA: Diagnosis not present

## 2019-01-19 ENCOUNTER — Telehealth: Payer: Self-pay | Admitting: Physician Assistant

## 2019-01-19 DIAGNOSIS — R110 Nausea: Secondary | ICD-10-CM

## 2019-01-19 MED ORDER — PROMETHAZINE HCL 25 MG PO TABS: 25.0000 mg | ORAL_TABLET | Freq: Three times a day (TID) | ORAL | 5 refills | Status: DC | PRN

## 2019-01-19 NOTE — Telephone Encounter (Signed)
Pt needs a refill on Promethazine 25 mg TABLETS not suppository  [facility]  [address]  CB#  [phone]  Thanks Con Memos

## 2019-01-19 NOTE — Telephone Encounter (Signed)
Refilled promethazine tabs

## 2019-03-01 ENCOUNTER — Other Ambulatory Visit: Payer: Self-pay | Admitting: Physician Assistant

## 2019-03-01 DIAGNOSIS — M533 Sacrococcygeal disorders, not elsewhere classified: Secondary | ICD-10-CM

## 2019-03-01 DIAGNOSIS — M7661 Achilles tendinitis, right leg: Secondary | ICD-10-CM

## 2019-03-01 DIAGNOSIS — M545 Low back pain, unspecified: Secondary | ICD-10-CM

## 2019-03-01 MED ORDER — HYDROCODONE-ACETAMINOPHEN 10-325 MG PO TABS: 1.0000 | ORAL_TABLET | ORAL | 0 refills | Status: DC | PRN

## 2019-04-01 DIAGNOSIS — F5081 Binge eating disorder: Secondary | ICD-10-CM | POA: Diagnosis not present

## 2019-04-01 DIAGNOSIS — F4312 Post-traumatic stress disorder, chronic: Secondary | ICD-10-CM | POA: Diagnosis not present

## 2019-04-01 DIAGNOSIS — R69 Illness, unspecified: Secondary | ICD-10-CM | POA: Diagnosis not present

## 2019-04-01 DIAGNOSIS — F411 Generalized anxiety disorder: Secondary | ICD-10-CM | POA: Diagnosis not present

## 2019-04-02 ENCOUNTER — Ambulatory Visit: Payer: Medicare HMO | Admitting: Physician Assistant

## 2019-04-02 ENCOUNTER — Other Ambulatory Visit: Payer: Self-pay

## 2019-05-12 ENCOUNTER — Other Ambulatory Visit: Payer: Self-pay | Admitting: Physician Assistant

## 2019-05-12 DIAGNOSIS — M7661 Achilles tendinitis, right leg: Secondary | ICD-10-CM

## 2019-05-12 DIAGNOSIS — M545 Low back pain, unspecified: Secondary | ICD-10-CM

## 2019-05-12 DIAGNOSIS — M533 Sacrococcygeal disorders, not elsewhere classified: Secondary | ICD-10-CM

## 2019-05-12 MED ORDER — HYDROCODONE-ACETAMINOPHEN 10-325 MG PO TABS: 1.0000 | ORAL_TABLET | ORAL | 0 refills | Status: DC | PRN

## 2019-06-08 DIAGNOSIS — R69 Illness, unspecified: Secondary | ICD-10-CM | POA: Diagnosis not present

## 2019-06-17 NOTE — Progress Notes (Signed)
Patient: Krystal Simpson Female    DOB: 11/08/1960   58 y.o.   MRN: [id number] Visit Date: 06/18/2019  Today's Provider: Mar Daring, PA-C   Chief Complaint  Patient presents with  . Rash   Subjective:     Rash The affected locations include the chest. The rash is characterized by itchiness and redness. Pertinent negatives include no anorexia, congestion, cough, diarrhea, eye pain, facial edema, fatigue, fever, joint pain, nail changes, shortness of breath, sore throat or vomiting. Past treatments include anti-itch cream. The treatment provided moderate relief.   Patient states she has had a sore and rash under her left breast intermittently for 4 months. Patient states she developed a sore from a bra she wore 4 months ago. A rash came up in the same area where the sore has under her breast. Patient has been using neosporin with moderate relief.   Patient also wants to discuss weight loss.   Also patient wants provider to check her left breast for lump at 9:00.  Allergies  Allergen Reactions  . Ciprofloxacin     Other reaction(s): Joint Pains  . Tetanus Toxoids Hives     Current Outpatient Medications:  .  azelastine (ASTELIN) 0.1 % nasal spray, Place 2 sprays into both nostrils 2 (two) times daily. Use in each nostril as directed, Disp: , Rfl:  .  baclofen (LIORESAL) 10 MG tablet, Take 1 tablet (10 mg total) by mouth 3 (three) times daily., Disp: 30 each, Rfl: 5 .  Cholecalciferol 1000 UNITS capsule, Take 1,000 Units by mouth daily. , Disp: , Rfl:  .  clonazePAM (KLONOPIN) 0.25 MG disintegrating tablet, Take 0.25 mg by mouth daily. , Disp: , Rfl: 1 .  cyanocobalamin 1000 MCG tablet, Take 1,000 mcg by mouth daily. , Disp: , Rfl:  .  diclofenac (VOLTAREN) 75 MG EC tablet, Take 75 mg by mouth 2 (two) times daily., Disp: , Rfl:  .  DULoxetine (CYMBALTA) 30 MG capsule, Take 90 mg by mouth daily. , Disp: , Rfl:  .  Flaxseed, Linseed, (FLAX SEEDS PO), Take 1 capsule by  mouth daily., Disp: , Rfl:  .  Fremanezumab-vfrm (AJOVY) 225 MG/1.5ML SOSY, Inject into the skin., Disp: , Rfl:  .  gabapentin (NEURONTIN) 300 MG capsule, Take 1 capsule (300 mg total) by mouth 2 (two) times daily. (Patient taking differently: Take 300 mg by mouth 3 (three) times daily. ), Disp: 60 capsule, Rfl: 0 .  HYDROcodone-acetaminophen (NORCO) 10-325 MG tablet, Take 1 tablet by mouth every 4 (four) hours as needed., Disp: 180 tablet, Rfl: 0 .  lamoTRIgine (LAMICTAL) 200 MG tablet, Take 200 mg by mouth daily., Disp: , Rfl: 1 .  lisdexamfetamine (VYVANSE) 50 MG capsule, Take 1 capsule (50 mg total) by mouth daily., Disp: 30 capsule, Rfl: 0 .  meloxicam (MOBIC) 15 MG tablet, Take 15 mg by mouth daily., Disp: , Rfl:  .  MULTIPLE VITAMINS-MINERALS PO, Take 1 tablet by mouth daily. , Disp: , Rfl:  .  Omega-3 Fatty Acids (FISH OIL) 1000 MG CAPS, Take 1 capsule by mouth daily. , Disp: , Rfl:  .  OVER THE COUNTER MEDICATION, Numbing cream for neck pain, Disp: , Rfl:  .  pantoprazole (PROTONIX) 40 MG tablet, Take 40 mg by mouth daily., Disp: , Rfl:  .  Polyethylene Glycol 3350 (MIRALAX PO), Take 17 g by mouth daily. , Disp: , Rfl:  .  promethazine (PHENERGAN) 25 MG tablet, Take 1 tablet (25  mg total) by mouth every 8 (eight) hours as needed for nausea or vomiting., Disp: 20 tablet, Rfl: 5 .  sertraline (ZOLOFT) 100 MG tablet, Take 100 mg by mouth 2 (two) times daily. , Disp: , Rfl:  .  SUMAtriptan (IMITREX) 100 MG tablet, TAKE 1 AT HEADACHE ONSET, MAY REPEAT AFTER 2 HOURS, NO MORE THAN 2 IN A DAY,NO MORE THAN 4 IN A WEEK, Disp: , Rfl:  .  terbinafine (LAMISIL) 250 MG tablet, Take 250 mg by mouth daily., Disp: , Rfl:  .  Tetrahydrozoline HCl (EYE DROPS OP), Apply 1 drop to eye 2 (two) times daily. , Disp: , Rfl:  .  topiramate (TOPAMAX) 100 MG tablet, Take 100 mg by mouth daily., Disp: , Rfl:  .  topiramate (TOPAMAX) 25 MG tablet, Take 20-100 mg by mouth 2 (two) times daily. Take 25 mg by mouth in the  morning and 100 mg by mouth at bedtime., Disp: , Rfl:  .  topiramate (TOPAMAX) 50 MG tablet, TAKE 50 MG IN THE MORNING AND 100 MG NIGHTLY., Disp: , Rfl: 4 .  traMADol (ULTRAM) 50 MG tablet, Take by mouth at bedtime as needed., Disp: , Rfl:  .  traZODone (DESYREL) 50 MG tablet, Take 100 mg by mouth at bedtime as needed for sleep., Disp: , Rfl: 1 .  atorvastatin (LIPITOR) 10 MG tablet, Take 1 tablet (10 mg total) by mouth daily. (Patient not taking: Reported on 04/16/2018), Disp: 30 tablet, Rfl: 11 .  famotidine (PEPCID) 20 MG tablet, Take 1 tablet (20 mg total) by mouth 2 (two) times daily. (Patient not taking: Reported on 06/01/2018), Disp: 60 tablet, Rfl: 0 .  meloxicam (MOBIC) 7.5 MG tablet, Take 1 tablet (7.5 mg total) by mouth daily. (Patient not taking: Reported on 10/28/2018), Disp: 30 tablet, Rfl: 0  Review of Systems  Constitutional: Negative for appetite change, chills, fatigue and fever.  HENT: Negative for congestion and sore throat.   Eyes: Negative for pain.  Respiratory: Negative for cough, chest tightness and shortness of breath.   Cardiovascular: Negative for chest pain and palpitations.  Gastrointestinal: Negative for abdominal pain, anorexia, diarrhea, nausea and vomiting.  Musculoskeletal: Negative for joint pain.  Skin: Positive for rash. Negative for nail changes.  Neurological: Negative for dizziness and weakness.    Social History   Tobacco Use  . Smoking status: Never Smoker  . Smokeless tobacco: Never Used  Substance Use Topics  . Alcohol use: No    Alcohol/week: 0.0 standard drinks      Objective:   BP 120/79 (BP Location: Left Arm, Patient Position: Sitting, Cuff Size: Large)   Pulse (!) 109   Temp (!) 97.1 F (36.2 C) (Other (Comment))   Resp 16   Wt 183 lb (83 kg)   SpO2 96%   BMI 30.45 kg/m  Vitals:   06/18/19 0955  BP: 120/79  Pulse: (!) 109  Resp: 16  Temp: (!) 97.1 F (36.2 C)  TempSrc: Other (Comment)  SpO2: 96%  Weight: 183 lb (83 kg)   Body mass index is 30.45 kg/m.   Physical Exam Vitals signs reviewed.  Constitutional:      General: She is not in acute distress.    Appearance: Normal appearance. She is well-developed. She is obese. She is not ill-appearing or diaphoretic.  Neck:     Musculoskeletal: Normal range of motion and neck supple.     Thyroid: No thyromegaly.     Vascular: No JVD.     Trachea: No  tracheal deviation.  Cardiovascular:     Rate and Rhythm: Normal rate and regular rhythm.     Pulses: Normal pulses.     Heart sounds: Normal heart sounds. No murmur. No friction rub. No gallop.   Pulmonary:     Effort: Pulmonary effort is normal. No respiratory distress.     Breath sounds: Normal breath sounds. No wheezing or rales.  Chest:     Chest wall: No mass or tenderness.     Breasts: Breasts are symmetrical.        Right: Normal. No mass, nipple discharge, skin change or tenderness.        Left: Normal. No mass, nipple discharge, skin change or tenderness.  Lymphadenopathy:     Cervical: No cervical adenopathy.     Upper Body:     Right upper body: No supraclavicular, axillary or pectoral adenopathy.     Left upper body: No supraclavicular, axillary or pectoral adenopathy.  Skin:      Neurological:     Mental Status: She is alert.      No results found for any visits on 06/18/19.     Assessment & Plan    1. Intertrigo Patient has been applying antibiotic ointment without relief. I suspect more yeast intertrigo based on appearance and not improving. Will send in nystatin as below. Call if not improving. - nystatin cream (MYCOSTATIN); Apply 1 application topically 2 (two) times daily.  Dispense: 30 g; Refill: 0  2. Class 1 obesity due to excess calories with serious comorbidity and body mass index (BMI) of 30.0 to 30.9 in adult Patient reports she spoke with Dr. Nicolasa Ducking, psychiatry, about weight loss and with her psychiatric disorder causing binge eating, she felt she may be a candidate for  some weight loss surgery. With covid patient has gained from 162 pounds in February to 183 today. I have placed referral for her to [facility] to see if she may be a candidate for a bariatric procedure.  - Ambulatory referral to General Surgery  3. Binge eating See above medical treatment plan. - Ambulatory referral to [place], PA-C  [facility]

## 2019-06-18 ENCOUNTER — Encounter: Payer: Self-pay | Admitting: Physician Assistant

## 2019-06-18 ENCOUNTER — Ambulatory Visit (INDEPENDENT_AMBULATORY_CARE_PROVIDER_SITE_OTHER): Payer: Medicare HMO | Admitting: Physician Assistant

## 2019-06-18 ENCOUNTER — Other Ambulatory Visit: Payer: Self-pay

## 2019-06-18 VITALS — BP 120/79 | HR 109 | Temp 97.1°F | Resp 16 | Wt 183.0 lb

## 2019-06-18 DIAGNOSIS — L304 Erythema intertrigo: Secondary | ICD-10-CM | POA: Diagnosis not present

## 2019-06-18 DIAGNOSIS — E6609 Other obesity due to excess calories: Secondary | ICD-10-CM | POA: Diagnosis not present

## 2019-06-18 DIAGNOSIS — R632 Polyphagia: Secondary | ICD-10-CM | POA: Diagnosis not present

## 2019-06-18 DIAGNOSIS — Z6830 Body mass index (BMI) 30.0-30.9, adult: Secondary | ICD-10-CM | POA: Diagnosis not present

## 2019-06-18 MED ORDER — NYSTATIN 100000 UNIT/GM EX CREA: 1.0000 "application " | TOPICAL_CREAM | Freq: Two times a day (BID) | CUTANEOUS | 0 refills | Status: DC

## 2019-06-18 NOTE — Patient Instructions (Signed)
Intertrigo Intertrigo is skin irritation (inflammation) that happens in warm, moist areas of the body. The irritation can cause a rash and make skin raw and itchy. The rash is usually pink or red. It happens mostly between folds of skin or where skin rubs together, such as:  Between the toes.  In the armpits.  In the groin area.  Under the belly.  Under the breasts.  Around the butt area. This condition is not passed from person to person (is not contagious). What are the causes?  Heat, moisture, rubbing, and not enough air movement.  The condition can be made worse by: ? Sweat. ? Bacteria. ? A fungus, such as yeast. What increases the risk?  Moisture in your skin folds.  You are more likely to develop this condition if you: ? Have diabetes. ? Are overweight. ? Are not able to move around. ? Live in a warm and moist climate. ? Wear splints, braces, or other medical devices. ? Are not able to control your pee (urine) or poop (stool). What are the signs or symptoms?  A pink or red skin rash in the skin fold or near the skin fold.  Raw or scaly skin.  Itching.  A burning feeling.  Bleeding.  Leaking fluid.  A bad smell. How is this treated?  Cleaning and drying your skin.  Taking an antibiotic medicine or using an antibiotic skin cream for a bacterial infection.  Using an antifungal cream on your skin or taking pills for an infection that was caused by a fungus, such as yeast.  Using a steroid ointment to stop the itching and irritation.  Separating the skin fold with a clean cotton cloth to absorb moisture and allow air to flow into the area. Follow these instructions at home:  Keep the affected area clean and dry.  Do not scratch your skin.  Stay cool as much as you can. Use an air conditioner or a fan, if you have one.  Apply over-the-counter and prescription medicines only as told by your doctor.  If you were prescribed an antibiotic medicine,  use it as told by your doctor. Do not stop using the antibiotic even if your condition starts to get better.  Keep all follow-up visits as told by your doctor. This is important. How is this prevented?   Stay at a healthy weight.  Take care of your feet. This is very important if you have diabetes. You should: ? Wear shoes that fit well. ? Keep your feet dry. ? Wear clean cotton or wool socks.  Protect the skin in your groin and butt area as told by your doctor. To do this: ? Follow a regular cleaning routine. ? Use creams, powders, or ointments that protect your skin. ? Change protection pads often.  Do not wear tight clothes. Wear clothes that: ? Are loose. ? Take moisture away from your body. ? Are made of cotton.  Wear a bra that gives good support, if needed.  Shower and dry yourself well after being active. Use a hair dryer on a cool setting to dry between skin folds.  Keep your blood sugar under control if you have diabetes. Contact a doctor if:  Your symptoms do not get better with treatment.  Your symptoms get worse or they spread.  You notice more redness and warmth.  You have a fever. Summary  Intertrigo is skin irritation that occurs when folds of skin rub together.  This condition is caused by heat, moisture,   and rubbing.  This condition may be treated by cleaning and drying your skin and with medicines.  Apply over-the-counter and prescription medicines only as told by your doctor.  Keep all follow-up visits as told by your doctor. This is important. This information is not intended to replace advice given to you by your health care provider. Make sure you discuss any questions you have with your health care provider. Document Released: 10/12/2010 Document Revised: 06/18/2018 Document Reviewed: 06/18/2018 Elsevier Patient Education  2020 [employer]  

## 2019-06-30 DIAGNOSIS — R69 Illness, unspecified: Secondary | ICD-10-CM | POA: Diagnosis not present

## 2019-06-30 DIAGNOSIS — F4312 Post-traumatic stress disorder, chronic: Secondary | ICD-10-CM | POA: Diagnosis not present

## 2019-06-30 DIAGNOSIS — F411 Generalized anxiety disorder: Secondary | ICD-10-CM | POA: Diagnosis not present

## 2019-06-30 DIAGNOSIS — F5081 Binge eating disorder: Secondary | ICD-10-CM | POA: Diagnosis not present

## 2019-07-23 ENCOUNTER — Other Ambulatory Visit: Payer: Self-pay | Admitting: Physician Assistant

## 2019-07-23 DIAGNOSIS — M545 Low back pain, unspecified: Secondary | ICD-10-CM

## 2019-07-23 DIAGNOSIS — M533 Sacrococcygeal disorders, not elsewhere classified: Secondary | ICD-10-CM

## 2019-07-23 DIAGNOSIS — M7661 Achilles tendinitis, right leg: Secondary | ICD-10-CM

## 2019-07-23 NOTE — Telephone Encounter (Signed)
[facility] reports patient did fill prescription in August.

## 2019-07-23 NOTE — Telephone Encounter (Signed)
Appears this was Rx'd 2 months ago and not filled at that time.

## 2019-07-23 NOTE — Telephone Encounter (Signed)
Please review for Jenni? °

## 2019-07-26 ENCOUNTER — Other Ambulatory Visit: Payer: Self-pay | Admitting: Physician Assistant

## 2019-07-26 DIAGNOSIS — M545 Low back pain, unspecified: Secondary | ICD-10-CM

## 2019-07-26 DIAGNOSIS — M7661 Achilles tendinitis, right leg: Secondary | ICD-10-CM

## 2019-07-26 DIAGNOSIS — M7662 Achilles tendinitis, left leg: Secondary | ICD-10-CM

## 2019-07-26 DIAGNOSIS — M533 Sacrococcygeal disorders, not elsewhere classified: Secondary | ICD-10-CM

## 2019-07-26 NOTE — Telephone Encounter (Signed)
Can you e-prescribe these if appropriate for [facility]? My e-prescribing of controlled substances is down currently

## 2019-07-26 NOTE — Telephone Encounter (Signed)
Our controlled prescription is down. She can come pick up a prescription if she wants or wait until it is fixed for e-prescribing. I would write for #30 tablets and let jenni review the scrip on refill.

## 2019-07-27 MED ORDER — HYDROCODONE-ACETAMINOPHEN 10-325 MG PO TABS: 1.0000 | ORAL_TABLET | ORAL | 0 refills | Status: DC | PRN

## 2019-07-27 NOTE — Telephone Encounter (Signed)
Doesn't seem to be a long-term chronic medication. Will let patient discuss with PCP when she is back in the office Monday.

## 2019-08-25 ENCOUNTER — Other Ambulatory Visit: Payer: Self-pay | Admitting: Physician Assistant

## 2019-08-25 DIAGNOSIS — M545 Low back pain, unspecified: Secondary | ICD-10-CM

## 2019-08-25 DIAGNOSIS — M533 Sacrococcygeal disorders, not elsewhere classified: Secondary | ICD-10-CM

## 2019-08-25 DIAGNOSIS — M7661 Achilles tendinitis, right leg: Secondary | ICD-10-CM

## 2019-08-25 DIAGNOSIS — M7662 Achilles tendinitis, left leg: Secondary | ICD-10-CM

## 2019-08-25 MED ORDER — HYDROCODONE-ACETAMINOPHEN 10-325 MG PO TABS: 1.0000 | ORAL_TABLET | ORAL | 0 refills | Status: DC | PRN

## 2019-09-14 ENCOUNTER — Other Ambulatory Visit: Payer: Self-pay | Admitting: Physician Assistant

## 2019-09-14 DIAGNOSIS — M545 Low back pain, unspecified: Secondary | ICD-10-CM

## 2019-09-14 DIAGNOSIS — M7661 Achilles tendinitis, right leg: Secondary | ICD-10-CM

## 2019-09-14 DIAGNOSIS — M533 Sacrococcygeal disorders, not elsewhere classified: Secondary | ICD-10-CM

## 2019-09-14 DIAGNOSIS — M7662 Achilles tendinitis, left leg: Secondary | ICD-10-CM

## 2019-09-14 MED ORDER — HYDROCODONE-ACETAMINOPHEN 10-325 MG PO TABS: 1.0000 | ORAL_TABLET | ORAL | 0 refills | Status: DC | PRN

## 2019-09-24 ENCOUNTER — Other Ambulatory Visit: Payer: Self-pay | Admitting: Physician Assistant

## 2019-09-26 NOTE — Telephone Encounter (Signed)
Requested medication (s) are due for refill today: no  Requested medication (s) are on the active medication list:no  Last refill:  08/08/2018  Future visit scheduled: no  Notes to clinic:  This refill cannot delegated    Requested Prescriptions  Pending Prescriptions Disp Refills   promethazine (PHENERGAN) 25 MG suppository [Pharmacy Med Name: PROMETHAZINE 25 MG SUPPOSITORY] 12 suppository 1    Sig: PLACE 1 SUPPOSITORY (25 MG TOTAL) RECTALLY EVERY 6 (SIX) HOURS AS NEEDED FOR NAUSEA      Not Delegated - Gastroenterology: Antiemetics Failed - 09/24/2019 12:29 PM      Failed - This refill cannot be delegated      Failed - Valid encounter within last 6 months    Recent Outpatient Visits     None

## 2019-09-27 NOTE — Telephone Encounter (Signed)
This is not our patient.

## 2019-09-30 DIAGNOSIS — F4312 Post-traumatic stress disorder, chronic: Secondary | ICD-10-CM | POA: Diagnosis not present

## 2019-09-30 DIAGNOSIS — R69 Illness, unspecified: Secondary | ICD-10-CM | POA: Diagnosis not present

## 2019-09-30 DIAGNOSIS — F5081 Binge eating disorder: Secondary | ICD-10-CM | POA: Diagnosis not present

## 2019-09-30 DIAGNOSIS — F411 Generalized anxiety disorder: Secondary | ICD-10-CM | POA: Diagnosis not present

## 2019-10-14 DIAGNOSIS — G43109 Migraine with aura, not intractable, without status migrainosus: Secondary | ICD-10-CM | POA: Diagnosis not present

## 2019-10-20 NOTE — Progress Notes (Signed)
Subjective:   Krystal Simpson is a 59 y.o. female who presents for Medicare Annual (Subsequent) preventive examination.    This visit is being conducted through telemedicine due to the COVID-19 pandemic. This patient has given me verbal consent via doximity to conduct this visit, patient states they are participating from their home address. Some vital signs may be absent or patient reported.    Patient identification: identified by name, DOB, and current address  Review of Systems:  N/A  Cardiac Risk Factors include: advanced age (>3men, >26 women)     Objective:     Vitals: There were no vitals taken for this visit.  There is no height or weight on file to calculate BMI. Unable to obtain vitals due to visit being conducted via telephonically.   Advanced Directives 10/21/2019 10/16/2018 10/16/2018 01/27/2018 10/06/2017 06/03/2017 10/04/2016  Does Patient Have a Medical Advance Directive? No No No No No No No  Does patient want to make changes to medical advance directive? - Yes (MAU/Ambulatory/Procedural Areas - Information given) Yes (MAU/Ambulatory/Procedural Areas - Information given) - - - -  Copy of [place] in Chart? - - - - - - -  Would patient like information on creating a medical advance directive? No - Patient declined - No - Patient declined No - Patient declined Yes (MAU/Ambulatory/Procedural Areas - Information given) No - Patient declined -    Tobacco Social History   Tobacco Use  Smoking Status Never Smoker  Smokeless Tobacco Never Used     Counseling given: Not Answered   Clinical Intake:  Pre-visit preparation completed: Yes  Pain : 0-10 Pain Score: 5  Pain Type: Chronic pain Pain Location: Back Pain Orientation: Lower Pain Descriptors / Indicators: Aching Pain Frequency: Constant     Nutritional Risks: None Diabetes: No  How often do you need to have someone help you when you read instructions, pamphlets, or other written  materials from your doctor or pharmacy?: 1 - Never  Interpreter Needed?: No  Information entered by :: [facility], LPN  Past Medical History:  Diagnosis Date  . Abnormal Pap smear of cervix   . Anxiety   . 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 Depression   . History of kidney stones    Past Surgical History:  Procedure Laterality Date  . BASAL CELL CARCINOMA EXCISION  2014  . BASAL CELL CARCINOMA EXCISION  09/20/2015   Dr. Evorn Gong, left mid forearm  . MOHS SURGERY  2015   top of head Dr[facility] Shelda Jakes [facility]  . SQUAMOUS CELL CARCINOMA EXCISION  2014   Family History  Problem Relation Age of Onset  . Emphysema Mother   . Lung cancer Father   . Healthy Sister   . Heart attack Sister 64  . Heart attack Maternal Grandmother   . Breast cancer Sister    Social History   Socioeconomic History  . Marital status: Married    Spouse name: Abbe Amsterdam  . Number of children: 2  . Years of education: some college  . Highest education level: Some college, no degree  Occupational History  . Occupation: disabled  Tobacco Use  . Smoking status: Never Smoker  . Smokeless tobacco: Never Used  Substance and Sexual Activity  . Alcohol use: No    Alcohol/week: 0.0 standard drinks  . Drug use: No  . Sexual activity: Not Currently  Other Topics Concern  . 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    Social Determinants of Health   Financial Resource Strain: Low Risk   . Difficulty of Paying Living Expenses: Not hard at all  Food Insecurity: No Food Insecurity  . Worried About Charity fundraiser in the Last Year: Never true  . Ran Out of Food in the Last Year: Never true  Transportation Needs: No Transportation Needs  . Lack of Transportation (Medical): No  . Lack of Transportation (Non-Medical): No  Physical Activity: Inactive  . Days of Exercise per Week: 0 days  . Minutes of Exercise per Session: 0 min    Stress: Stress Concern Present  . Feeling of Stress : To some extent  Social Connections: Somewhat Isolated  . Frequency of Communication with Friends and Family: More than three times a week  . Frequency of Social Gatherings with Friends and Family: Three times a week  . Attends Religious Services: Never  . Active Member of Clubs or Organizations: No  . Attends Archivist Meetings: Never  . Marital Status: Married    Outpatient Encounter Medications as of 10/21/2019  Medication Sig  . Cholecalciferol 1000 UNITS capsule Take 1,000 Units by mouth daily.   . clonazePAM (KLONOPIN) 0.25 MG disintegrating tablet Take 0.25 mg by mouth daily.   . cyanocobalamin 1000 MCG tablet Take 1,000 mcg by mouth daily.   . Flaxseed, Linseed, (FLAX SEEDS PO) Take 1 capsule by mouth daily.  Marland Kitchen HYDROcodone-acetaminophen (NORCO) 10-325 MG tablet Take 1 tablet by mouth every 4 (four) hours as needed. (Patient taking differently: Take 2 tablets by mouth every 6 (six) hours as needed. )  . lamoTRIgine (LAMICTAL) 200 MG tablet Take 200 mg by mouth daily.  Marland Kitchen lisdexamfetamine (VYVANSE) 50 MG capsule Take 1 capsule (50 mg total) by mouth daily.  . meloxicam (MOBIC) 15 MG tablet Take 15 mg by mouth daily.  . MULTIPLE VITAMINS-MINERALS PO Take 1 tablet by mouth daily.   . naloxone (NARCAN) nasal spray 4 mg/0.1 mL Place 1 spray into the nose. If needed  . Omega-3 Fatty Acids (FISH OIL) 1000 MG CAPS Take 1 capsule by mouth daily.   Marland Kitchen OVER THE COUNTER MEDICATION Numbing cream for neck pain as needed  . Polyethylene Glycol 3350 (MIRALAX PO) Take 17 g by mouth daily.   . promethazine (PHENERGAN) 25 MG tablet Take 1 tablet (25 mg total) by mouth every 8 (eight) hours as needed for nausea or vomiting.  . sertraline (ZOLOFT) 100 MG tablet Take 100 mg by mouth 2 (two) times daily.   . SUMAtriptan (IMITREX) 100 MG tablet TAKE 1 AT HEADACHE ONSET, MAY REPEAT AFTER 2 HOURS, NO MORE THAN 2 IN A DAY,NO MORE THAN 4 IN A WEEK   . terbinafine (LAMISIL) 250 MG tablet Take 250 mg by mouth daily.  . Tetrahydrozoline HCl (EYE DROPS OP) Apply 1 drop to eye 2 (two) times daily.   Marland Kitchen topiramate (TOPAMAX) 50 MG tablet 50 mg 2 (two) times daily. 100 mg in am and 50 mg in pm  . traZODone (DESYREL) 50 MG tablet Take 100 mg by mouth at bedtime as needed for sleep.  Marland Kitchen venlafaxine XR (EFFEXOR-XR) 75 MG 24 hr capsule Take 75 mg by mouth daily with breakfast.  . atorvastatin (LIPITOR) 10 MG tablet Take 1 tablet (10 mg total) by mouth daily. (Patient not taking: Reported on 04/16/2018)  . azelastine (ASTELIN) 0.1 % nasal spray Place 2 sprays into both nostrils  2 (two) times daily. Use in each nostril as directed  . baclofen (LIORESAL) 10 MG tablet Take 1 tablet (10 mg total) by mouth 3 (three) times daily. (Patient not taking: Reported on 10/21/2019)  . diclofenac (VOLTAREN) 75 MG EC tablet Take 75 mg by mouth 2 (two) times daily.  . DULoxetine (CYMBALTA) 30 MG capsule Take 90 mg by mouth daily.   . famotidine (PEPCID) 20 MG tablet Take 1 tablet (20 mg total) by mouth 2 (two) times daily. (Patient not taking: Reported on 06/01/2018)  . Fremanezumab-vfrm (AJOVY) 225 MG/1.5ML SOSY Inject into the skin.  Marland Kitchen gabapentin (NEURONTIN) 300 MG capsule Take 1 capsule (300 mg total) by mouth 2 (two) times daily. (Patient not taking: Reported on 10/21/2019)  . meloxicam (MOBIC) 7.5 MG tablet Take 1 tablet (7.5 mg total) by mouth daily. (Patient not taking: Reported on 10/28/2018)  . nystatin cream (MYCOSTATIN) Apply 1 application topically 2 (two) times daily. (Patient not taking: Reported on 10/21/2019)  . pantoprazole (PROTONIX) 40 MG tablet Take 40 mg by mouth daily.  Marland Kitchen topiramate (TOPAMAX) 100 MG tablet Take 100 mg by mouth daily.  Marland Kitchen topiramate (TOPAMAX) 25 MG tablet Take 20-100 mg by mouth 2 (two) times daily. Take 25 mg by mouth in the morning and 100 mg by mouth at bedtime.  . traMADol (ULTRAM) 50 MG tablet Take by mouth at bedtime as needed.   No  facility-administered encounter medications on file as of 10/21/2019.    Activities of Daily Living In your present state of health, do you have any difficulty performing the following activities: 10/21/2019  Hearing? N  Vision? N  Difficulty concentrating or making decisions? N  Walking or climbing stairs? Y  Comment Due to SOB.  Dressing or bathing? N  Doing errands, shopping? N  Preparing Food and eating ? N  Using the Toilet? N  In the past six months, have you accidently leaked urine? N  Do you have problems with loss of bowel control? N  Managing your Medications? N  Managing your Finances? N  Housekeeping or managing your Housekeeping? N  Some recent data might be hidden    Patient Care Team: Mar Daring, PA-C as PCP - General (Family Medicine) Arelia Sneddon, [place] as Consulting Physician (Optometry) Manley Mason, Donivan Scull, MD as Referring Physician (Dermatology) Oneta Rack, MD as Consulting Physician (Dermatology) Chauncey Mann, MD as Referring Physician (Psychiatry) Ruthy Dick, LCSW as Social Worker (Professional Counselor) Sharlet Salina, MD as Referring Physician (Physical Medicine and Rehabilitation) Meeler, Sherren Kerns, FNP (Family Medicine) Anabel Bene, MD as Referring Physician (Neurology) Jannifer Franklin, NP as Nurse Practitioner (Neurology) Carloyn Manner, MD as Referring Physician (Otolaryngology)    Assessment:   This is a routine wellness examination for [place].  Exercise Activities and Dietary recommendations Current Exercise Habits: The patient does not participate in regular exercise at present, Exercise limited by: None identified  Goals    . Exercise 150 minutes per week (moderate activity)     10/06/17- Pt to increase the amount of exercise done. Pt to start walking for 3-4 days a week for at least 30 minutes at a time.     Marland Kitchen LIFESTYLE - DECREASE FALLS RISK     Recommend to remove any items from the home that may  cause slips or trips.       Fall Risk: Fall Risk  10/21/2019 10/16/2018 06/01/2018 10/06/2017 06/03/2017  Falls in the past year? 1 1 Yes Yes Yes  Number  falls in past yr: 1 1 2  or more 1 2 or more  Comment - - - - walking dog; tripped on roots  Injury with Fall? 0 1 Yes No No  Comment - - Lower back - -  Risk Factor Category  - - - - -  Risk for fall due to : - - - - -  Follow up Falls prevention discussed Falls prevention discussed - Falls prevention discussed -    FALL RISK PREVENTION PERTAINING TO THE HOME:  Any stairs in or around the home? No  If so, are there any without handrails? N/A  Home free of loose throw rugs in walkways, pet beds, electrical cords, etc? Yes  Adequate lighting in your home to reduce risk of falls? Yes   ASSISTIVE DEVICES UTILIZED TO PREVENT FALLS:  Life alert? No  Use of a cane, walker or w/c? No  Grab bars in the bathroom? No  Shower chair or bench in shower? Yes  Elevated toilet seat or a handicapped toilet? Yes    TIMED UP AND GO:  Was the test performed? No .    Depression Screen PHQ 2/9 Scores 10/21/2019 10/21/2019 10/16/2018 10/06/2017  PHQ - 2 Score 0 0 3 6  PHQ- 9 Score - - 14 18  Exception Documentation - - - -     Cognitive Function     6CIT Screen 10/21/2019 10/06/2017  What Year? 0 points 0 points  What month? 0 points 0 points  What time? 0 points 0 points  Count back from 20 0 points 0 points  Months in reverse 0 points 0 points  Repeat phrase 0 points 2 points  Total Score 0 2    Immunization History  Administered Date(s) Administered  . Influenza, Seasonal, Injecte, Preservative Fre 06/24/2015  . Influenza,inj,Quad PF,6+ Mos 10/04/2016, 06/02/2017, 06/04/2018, 06/08/2019  . Zoster Recombinat (Shingrix) 06/08/2019    Qualifies for Shingles Vaccine? Yes , received first dose on 06/08/19. Awaiting second dose.   Tdap: Up to date  Flu Vaccine: Up to date   Screening Tests Health Maintenance  Topic Date Due  .  MAMMOGRAM  04/24/2020  . PAP SMEAR-Modifier  10/09/2020  . COLONOSCOPY  08/17/2023  . TETANUS/TDAP  10/04/2026  . INFLUENZA VACCINE  Completed  . Hepatitis C Screening  Completed  . HIV Screening  Completed    Cancer Screenings:  Colorectal Screening: Completed 08/16/13. Repeat every 10 years.   Mammogram: Completed 04/24/18. Repeat every 1-2 years as advised. Ordered 10/28/18.   Lung Cancer Screening: (Low Dose CT Chest recommended if Age 43-80 years, 30 pack-year currently smoking OR have quit w/in 15years.) does not qualify.   Additional Screening:  Hepatitis C Screening: Up to date  Vision Screening: Recommended annual ophthalmology exams for early detection of glaucoma and other disorders of the eye.  Dental Screening: Recommended annual dental exams for proper oral hygiene  Community Resource Referral:  CRR required this visit?  No       Plan:  I have personally reviewed and addressed the Medicare Annual Wellness questionnaire and have noted the following in the patient's chart:  A. Medical and social history B. Use of alcohol, tobacco or illicit drugs  C. Current medications and supplements D. Functional ability and status E.  Nutritional status F.  Physical activity G. Advance directives H. List of other physicians I.  Hospitalizations, surgeries, and ER visits in previous 12 months J.  [place] such as hearing and vision if needed, cognitive  and depression L. Referrals and appointments   In addition, I have reviewed and discussed with patient certain preventive protocols, quality metrics, and best practice recommendations. A written personalized care plan for preventive services as well as general preventive health recommendations were provided to patient. Nurse Health Advisor  Signed,    Wiatt Mahabir [place], [place]  [id number] Nurse Health Advisor   Nurse Notes: None.

## 2019-10-21 ENCOUNTER — Other Ambulatory Visit: Payer: Self-pay

## 2019-10-21 ENCOUNTER — Ambulatory Visit (INDEPENDENT_AMBULATORY_CARE_PROVIDER_SITE_OTHER): Payer: Medicare HMO

## 2019-10-21 DIAGNOSIS — Z0000 Encounter for general adult medical examination without abnormal findings: Secondary | ICD-10-CM

## 2019-10-21 DIAGNOSIS — F4312 Post-traumatic stress disorder, chronic: Secondary | ICD-10-CM | POA: Diagnosis not present

## 2019-10-21 DIAGNOSIS — R69 Illness, unspecified: Secondary | ICD-10-CM | POA: Diagnosis not present

## 2019-10-21 DIAGNOSIS — F5081 Binge eating disorder: Secondary | ICD-10-CM | POA: Diagnosis not present

## 2019-10-21 DIAGNOSIS — F411 Generalized anxiety disorder: Secondary | ICD-10-CM | POA: Diagnosis not present

## 2019-10-21 NOTE — Patient Instructions (Addendum)
[name] , Thank you for taking time to come for your Medicare Wellness Visit. I appreciate your ongoing commitment to your health goals. Please review the following plan we discussed and let me know if I can assist you in the future.   Screening recommendations/referrals: Colonoscopy: Up to date, due 07/2023 Mammogram: Up to date, due 04/2020. Ordered 10/28/18- pt plans to call and set apt up this year. Recommended yearly ophthalmology/optometry visit for glaucoma screening and checkup Recommended yearly dental visit for hygiene and checkup  Vaccinations: Influenza vaccine: Up to date Tdap vaccine: Up to date, due 09/2026 Shingles vaccine: Awaiting Shingrix #2.    Advanced directives: Advance directive discussed with you today. Even though you declined this today please call our office should you change your mind and we can give you the proper paperwork for you to fill out.  Conditions/risks identified: Fall risk prevention discussed today. Continue to work towards exercising 3 days a week for 30 minutes at a time.  Next appointment: 10/26/20 @ 2:40 PM for an AWV. Pt to call office to schedule CPE with PCP.  Preventive Care 40-64 Years, Female Preventive care refers to lifestyle choices and visits with your health care provider that can promote health and wellness. What does preventive care include?  A yearly physical exam. This is also called an annual well check.  Dental exams once or twice a year.  Routine eye exams. Ask your health care provider how often you should have your eyes checked.  Personal lifestyle choices, including:  Daily care of your teeth and gums.  Regular physical activity.  Eating a healthy diet.  Avoiding tobacco and drug use.  Limiting alcohol use.  Practicing safe sex.  Taking low-dose aspirin daily starting at age 64.  Taking vitamin and mineral supplements as recommended by your health care provider. What happens during an annual well  check? The services and screenings done by your health care provider during your annual well check will depend on your age, overall health, lifestyle risk factors, and family history of disease. Counseling  Your health care provider may ask you questions about your:  Alcohol use.  Tobacco use.  Drug use.  Emotional well-being.  Home and relationship well-being.  Sexual activity.  Eating habits.  Work and work Statistician.  Method of birth control.  Menstrual cycle.  Pregnancy history. Screening  You may have the following tests or measurements:  Height, weight, and BMI.  Blood pressure.  Lipid and cholesterol levels. These may be checked every 5 years, or more frequently if you are over 66 years old.  Skin check.  Lung cancer screening. You may have this screening every year starting at age 53 if you have a 30-pack-year history of smoking and currently smoke or have quit within the past 15 years.  Fecal occult blood test (FOBT) of the stool. You may have this test every year starting at age 47.  Flexible sigmoidoscopy or colonoscopy. You may have a sigmoidoscopy every 5 years or a colonoscopy every 10 years starting at age 25.  Hepatitis C blood test.  Hepatitis B blood test.  Sexually transmitted disease (STD) testing.  Diabetes screening. This is done by checking your blood sugar (glucose) after you have not eaten for a while (fasting). You may have this done every 1-3 years.  Mammogram. This may be done every 1-2 years. Talk to your health care provider about when you should start having regular mammograms. This may depend on whether you have a family  history of breast cancer.  BRCA-related cancer screening. This may be done if you have a family history of breast, ovarian, tubal, or peritoneal cancers.  Pelvic exam and Pap test. This may be done every 3 years starting at age 21. Starting at age 30, this may be done every 5 years if you have a Pap test in  combination with an HPV test.  Bone density scan. This is done to screen for osteoporosis. You may have this scan if you are at high risk for osteoporosis. Discuss your test results, treatment options, and if necessary, the need for more tests with your health care provider. Vaccines  Your health care provider may recommend certain vaccines, such as:  Influenza vaccine. This is recommended every year.  Tetanus, diphtheria, and acellular pertussis (Tdap, Td) vaccine. You may need a Td booster every 10 years.  Zoster vaccine. You may need this after age 60.  Pneumococcal 13-valent conjugate (PCV13) vaccine. You may need this if you have certain conditions and were not previously vaccinated.  Pneumococcal polysaccharide (PPSV23) vaccine. You may need one or two doses if you smoke cigarettes or if you have certain conditions. Talk to your health care provider about which screenings and vaccines you need and how often you need them. This information is not intended to replace advice given to you by your health care provider. Make sure you discuss any questions you have with your health care provider. Document Released: 10/06/2015 Document Revised: 05/29/2016 Document Reviewed: 07/11/2015 Elsevier Interactive Patient Education  2017 Elsevier Inc.    Fall Prevention in the Home Falls can cause injuries. They can happen to people of all ages. There are many things you can do to make your home safe and to help prevent falls. What can I do on the outside of my home?  Regularly fix the edges of walkways and driveways and fix any cracks.  Remove anything that might make you trip as you walk through a door, such as a raised step or threshold.  Trim any bushes or trees on the path to your home.  Use bright outdoor lighting.  Clear any walking paths of anything that might make someone trip, such as rocks or tools.  Regularly check to see if handrails are loose or broken. Make sure that both  sides of any steps have handrails.  Any raised decks and porches should have guardrails on the edges.  Have any leaves, snow, or ice cleared regularly.  Use sand or salt on walking paths during winter.  Clean up any spills in your garage right away. This includes oil or grease spills. What can I do in the bathroom?  Use night lights.  Install grab bars by the toilet and in the tub and shower. Do not use towel bars as grab bars.  Use non-skid mats or decals in the tub or shower.  If you need to sit down in the shower, use a plastic, non-slip stool.  Keep the floor dry. Clean up any water that spills on the floor as soon as it happens.  Remove soap buildup in the tub or shower regularly.  Attach bath mats securely with double-sided non-slip rug tape.  Do not have throw rugs and other things on the floor that can make you trip. What can I do in the bedroom?  Use night lights.  Make sure that you have a light by your bed that is easy to reach.  Do not use any sheets or blankets that are too   big for your bed. They should not hang down onto the floor.  Have a firm chair that has side arms. You can use this for support while you get dressed.  Do not have throw rugs and other things on the floor that can make you trip. What can I do in the kitchen?  Clean up any spills right away.  Avoid walking on wet floors.  Keep items that you use a lot in easy-to-reach places.  If you need to reach something above you, use a strong step stool that has a grab bar.  Keep electrical cords out of the way.  Do not use floor polish or wax that makes floors slippery. If you must use wax, use non-skid floor wax.  Do not have throw rugs and other things on the floor that can make you trip. What can I do with my stairs?  Do not leave any items on the stairs.  Make sure that there are handrails on both sides of the stairs and use them. Fix handrails that are broken or loose. Make sure that  handrails are as long as the stairways.  Check any carpeting to make sure that it is firmly attached to the stairs. Fix any carpet that is loose or worn.  Avoid having throw rugs at the top or bottom of the stairs. If you do have throw rugs, attach them to the floor with carpet tape.  Make sure that you have a light switch at the top of the stairs and the bottom of the stairs. If you do not have them, ask someone to add them for you. What else can I do to help prevent falls?  Wear shoes that:  Do not have high heels.  Have rubber bottoms.  Are comfortable and fit you well.  Are closed at the toe. Do not wear sandals.  If you use a stepladder:  Make sure that it is fully opened. Do not climb a closed stepladder.  Make sure that both sides of the stepladder are locked into place.  Ask someone to hold it for you, if possible.  Clearly mark and make sure that you can see:  Any grab bars or handrails.  First and last steps.  Where the edge of each step is.  Use tools that help you move around (mobility aids) if they are needed. These include:  Canes.  Walkers.  Scooters.  Crutches.  Turn on the lights when you go into a dark area. Replace any light bulbs as soon as they burn out.  Set up your furniture so you have a clear path. Avoid moving your furniture around.  If any of your floors are uneven, fix them.  If there are any pets around you, be aware of where they are.  Review your medicines with your doctor. Some medicines can make you feel dizzy. This can increase your chance of falling. Ask your doctor what other things that you can do to help prevent falls. This information is not intended to replace advice given to you by your health care provider. Make sure you discuss any questions you have with your health care provider. Document Released: 07/06/2009 Document Revised: 02/15/2016 Document Reviewed: 10/14/2014 Elsevier Interactive Patient Education  2017  [employer].

## 2019-10-26 ENCOUNTER — Other Ambulatory Visit: Payer: Self-pay | Admitting: Physician Assistant

## 2019-10-26 DIAGNOSIS — H04123 Dry eye syndrome of bilateral lacrimal glands: Secondary | ICD-10-CM | POA: Diagnosis not present

## 2019-10-26 DIAGNOSIS — M7661 Achilles tendinitis, right leg: Secondary | ICD-10-CM

## 2019-10-26 DIAGNOSIS — M533 Sacrococcygeal disorders, not elsewhere classified: Secondary | ICD-10-CM

## 2019-10-26 DIAGNOSIS — M545 Low back pain, unspecified: Secondary | ICD-10-CM

## 2019-10-26 MED ORDER — HYDROCODONE-ACETAMINOPHEN 10-325 MG PO TABS: 1.0000 | ORAL_TABLET | ORAL | 0 refills | Status: DC | PRN

## 2019-11-15 ENCOUNTER — Other Ambulatory Visit: Payer: Self-pay

## 2019-11-15 ENCOUNTER — Encounter: Payer: Self-pay | Admitting: Physician Assistant

## 2019-11-15 ENCOUNTER — Ambulatory Visit
Admission: RE | Admit: 2019-11-15 | Discharge: 2019-11-15 | Disposition: A | Discharge status: Home / Self Care | Payer: Medicare HMO | Source: Ambulatory Visit | Attending: Physician Assistant | Admitting: Physician Assistant

## 2019-11-15 ENCOUNTER — Ambulatory Visit
Admission: RE | Admit: 2019-11-15 | Discharge: 2019-11-15 | Disposition: A | Discharge status: Home / Self Care | Payer: Medicare HMO | Attending: Physician Assistant | Admitting: Physician Assistant

## 2019-11-15 ENCOUNTER — Ambulatory Visit (INDEPENDENT_AMBULATORY_CARE_PROVIDER_SITE_OTHER): Payer: Medicare HMO | Admitting: Physician Assistant

## 2019-11-15 VITALS — BP 129/83 | HR 79 | Temp 97.3°F | Wt 181.8 lb

## 2019-11-15 DIAGNOSIS — W19XXXA Unspecified fall, initial encounter: Secondary | ICD-10-CM | POA: Insufficient documentation

## 2019-11-15 DIAGNOSIS — R519 Headache, unspecified: Secondary | ICD-10-CM | POA: Insufficient documentation

## 2019-11-15 DIAGNOSIS — Y939 Activity, unspecified: Secondary | ICD-10-CM | POA: Diagnosis not present

## 2019-11-15 DIAGNOSIS — Y999 Unspecified external cause status: Secondary | ICD-10-CM | POA: Diagnosis not present

## 2019-11-15 DIAGNOSIS — R200 Anesthesia of skin: Secondary | ICD-10-CM | POA: Insufficient documentation

## 2019-11-15 DIAGNOSIS — R202 Paresthesia of skin: Secondary | ICD-10-CM | POA: Diagnosis not present

## 2019-11-15 DIAGNOSIS — S0993XA Unspecified injury of face, initial encounter: Secondary | ICD-10-CM | POA: Diagnosis not present

## 2019-11-15 DIAGNOSIS — Z043 Encounter for examination and observation following other accident: Secondary | ICD-10-CM | POA: Diagnosis not present

## 2019-11-15 DIAGNOSIS — Y929 Unspecified place or not applicable: Secondary | ICD-10-CM | POA: Insufficient documentation

## 2019-11-15 MED ORDER — METHYLPREDNISOLONE 4 MG PO TBPK: ORAL_TABLET | ORAL | 0 refills | Status: DC

## 2019-11-15 NOTE — Progress Notes (Signed)
Patient: Krystal Simpson Female    DOB: 29-Nov-1960   59 y.o.   MRN: [id number] Visit Date: 11/15/2019  Today's Provider: Mar Daring, PA-C   Chief Complaint  Patient presents with  . Fall  . Facial Pain   Subjective:     Fall The accident occurred more than 1 week ago. The fall occurred while walking and while standing. She landed on concrete. The point of impact was the face. The pain is present in the face. The pain is at a severity of 4/10. The pain is mild. The symptoms are aggravated by pressure on injury and cold. Associated symptoms include numbness and tingling. She has tried ice for the symptoms. The treatment provided mild relief.   She reports it was around 430 in the morning over a week ago. She was trying to catch some of her dogs urine to see if she had a kidney infection and got her feet tangled up trying to move with the dog and fell face first (left side) into asphalt. She does have a dental implant and has already seen her dentist to confirm the implant was ok.   Still having some tenderness to palpation over the left zygomatic arch, the left eyebrow area, and left upper jaw in the front. Reports numbness over left forehead, left cheek and left upper jaw to the teeth. Denies any breathing issues or chewing issues. Dentist reported implant and TM joint were all ok.    Allergies  Allergen Reactions  . Ciprofloxacin     Other reaction(s): Joint Pains  . Tetanus Toxoids Hives     Current Outpatient Medications:  .  azelastine (ASTELIN) 0.1 % nasal spray, Place 2 sprays into both nostrils 2 (two) times daily. Use in each nostril as directed, Disp: , Rfl:  .  Cholecalciferol 1000 UNITS capsule, Take 1,000 Units by mouth daily. , Disp: , Rfl:  .  clonazePAM (KLONOPIN) 0.25 MG disintegrating tablet, Take 0.25 mg by mouth daily. , Disp: , Rfl: 1 .  cyanocobalamin 1000 MCG tablet, Take 1,000 mcg by mouth daily. , Disp: , Rfl:  .  diclofenac (VOLTAREN) 75 MG  EC tablet, Take 75 mg by mouth 2 (two) times daily., Disp: , Rfl:  .  Erenumab-aooe (AIMOVIG) 140 MG/ML SOAJ, Inject into the skin., Disp: , Rfl:  .  Flaxseed, Linseed, (FLAX SEEDS PO), Take 1 capsule by mouth daily., Disp: , Rfl:  .  Fremanezumab-vfrm (AJOVY) 225 MG/1.5ML SOSY, Inject into the skin., Disp: , Rfl:  .  HYDROcodone-acetaminophen (NORCO) 10-325 MG tablet, Take 1 tablet by mouth every 4 (four) hours as needed., Disp: 60 tablet, Rfl: 0 .  lamoTRIgine (LAMICTAL) 200 MG tablet, Take 200 mg by mouth daily., Disp: , Rfl: 1 .  lisdexamfetamine (VYVANSE) 50 MG capsule, Take 1 capsule (50 mg total) by mouth daily., Disp: 30 capsule, Rfl: 0 .  meloxicam (MOBIC) 15 MG tablet, Take 15 mg by mouth daily., Disp: , Rfl:  .  MULTIPLE VITAMINS-MINERALS PO, Take 1 tablet by mouth daily. , Disp: , Rfl:  .  naloxone (NARCAN) nasal spray 4 mg/0.1 mL, Place 1 spray into the nose. If needed, Disp: , Rfl:  .  nystatin cream (MYCOSTATIN), Apply 1 application topically 2 (two) times daily., Disp: 30 g, Rfl: 0 .  Omega-3 Fatty Acids (FISH OIL) 1000 MG CAPS, Take 1 capsule by mouth daily. , Disp: , Rfl:  .  OVER THE COUNTER MEDICATION, Numbing cream for neck  pain as needed, Disp: , Rfl:  .  pantoprazole (PROTONIX) 40 MG tablet, Take 40 mg by mouth daily., Disp: , Rfl:  .  Polyethylene Glycol 3350 (MIRALAX PO), Take 17 g by mouth daily. , Disp: , Rfl:  .  promethazine (PHENERGAN) 25 MG tablet, Take 1 tablet (25 mg total) by mouth every 8 (eight) hours as needed for nausea or vomiting., Disp: 20 tablet, Rfl: 5 .  sertraline (ZOLOFT) 100 MG tablet, Take 100 mg by mouth 2 (two) times daily. , Disp: , Rfl:  .  SUMAtriptan (IMITREX) 100 MG tablet, TAKE 1 AT HEADACHE ONSET, MAY REPEAT AFTER 2 HOURS, NO MORE THAN 2 IN A DAY,NO MORE THAN 4 IN A WEEK, Disp: , Rfl:  .  terbinafine (LAMISIL) 250 MG tablet, Take 250 mg by mouth daily., Disp: , Rfl:  .  Tetrahydrozoline HCl (EYE DROPS OP), Apply 1 drop to eye 2 (two) times  daily. , Disp: , Rfl:  .  topiramate (TOPAMAX) 100 MG tablet, Take 100 mg by mouth daily., Disp: , Rfl:  .  topiramate (TOPAMAX) 25 MG tablet, Take 20-100 mg by mouth 2 (two) times daily. Take 25 mg by mouth in the morning and 100 mg by mouth at bedtime., Disp: , Rfl:  .  topiramate (TOPAMAX) 50 MG tablet, 50 mg 2 (two) times daily. 100 mg in am and 50 mg in pm, Disp: , Rfl: 4 .  traMADol (ULTRAM) 50 MG tablet, Take by mouth at bedtime as needed., Disp: , Rfl:  .  traZODone (DESYREL) 50 MG tablet, Take 100 mg by mouth at bedtime as needed for sleep., Disp: , Rfl: 1 .  venlafaxine XR (EFFEXOR-XR) 75 MG 24 hr capsule, Take 75 mg by mouth daily with breakfast., Disp: , Rfl:  .  atorvastatin (LIPITOR) 10 MG tablet, Take 1 tablet (10 mg total) by mouth daily. (Patient not taking: Reported on 04/16/2018), Disp: 30 tablet, Rfl: 11 .  baclofen (LIORESAL) 10 MG tablet, Take 1 tablet (10 mg total) by mouth 3 (three) times daily. (Patient not taking: Reported on 10/21/2019), Disp: 30 each, Rfl: 5 .  DULoxetine (CYMBALTA) 30 MG capsule, Take 90 mg by mouth daily. , Disp: , Rfl:  .  famotidine (PEPCID) 20 MG tablet, Take 1 tablet (20 mg total) by mouth 2 (two) times daily. (Patient not taking: Reported on 06/01/2018), Disp: 60 tablet, Rfl: 0 .  gabapentin (NEURONTIN) 300 MG capsule, Take 1 capsule (300 mg total) by mouth 2 (two) times daily. (Patient not taking: Reported on 10/21/2019), Disp: 60 capsule, Rfl: 0 .  meloxicam (MOBIC) 7.5 MG tablet, Take 1 tablet (7.5 mg total) by mouth daily. (Patient not taking: Reported on 10/28/2018), Disp: 30 tablet, Rfl: 0  Review of Systems  Constitutional: Negative.   HENT: Negative for dental problem, ear pain, facial swelling and nosebleeds.   Respiratory: Negative.   Cardiovascular: Negative.   Musculoskeletal: Negative for neck pain and neck stiffness.  Neurological: Positive for tingling and numbness.    Social History   Tobacco Use  . Smoking status: Never Smoker    . Smokeless tobacco: Never Used  Substance Use Topics  . Alcohol use: No    Alcohol/week: 0.0 standard drinks      Objective:   BP 129/83 (BP Location: Left Arm, Patient Position: Sitting, Cuff Size: Normal)   Pulse 79   Temp (!) 97.3 F (36.3 C) (Temporal)   Wt 181 lb 12.8 oz (82.5 kg)   SpO2 95%  BMI 30.25 kg/m  Vitals:   11/15/19 1107  BP: 129/83  Pulse: 79  Temp: (!) 97.3 F (36.3 C)  TempSrc: Temporal  SpO2: 95%  Weight: 181 lb 12.8 oz (82.5 kg)  Body mass index is 30.25 kg/m.   Physical Exam Vitals reviewed.  Constitutional:      General: She is not in acute distress.    Appearance: Normal appearance. She is not ill-appearing.  HENT:     Head: Normocephalic and atraumatic. No abrasion, contusion, masses, right periorbital erythema, left periorbital erythema or laceration.     Jaw: There is normal jaw occlusion. No tenderness.      Nose: Nose normal.     Mouth/Throat:     Mouth: Mucous membranes are moist.     Pharynx: No oropharyngeal exudate or posterior oropharyngeal erythema.  Eyes:     General: No scleral icterus.    Extraocular Movements: Extraocular movements intact.     Pupils: Pupils are equal, round, and reactive to light.  Neurological:     Mental Status: She is alert.      No results found for any visits on 11/15/19.     Assessment & Plan    1. Fall, initial encounter Will get xray as below to r/o fractures. Has had fractures on the right side from a fall previously and was seen by an Conservation officer, nature. She does not desire to return to him if needed. Will also give medrol as below. I suspect more nerve compression most likely to branches of the facial nerve causing some mild injury to the nerve and bone contusions. Will treat with medrol as below. I will f/i pending xrays. Call if worsening.  - DG Facial Bones Complete; Future - methylPREDNISolone (MEDROL) 4 MG TBPK tablet; 6 day taper; take as directed on package instructions   Dispense: 21 tablet; Refill: 0  2. Face pain See above medical treatment plan. - DG Facial Bones Complete; Future - methylPREDNISolone (MEDROL) 4 MG TBPK tablet; 6 day taper; take as directed on package instructions  Dispense: 21 tablet; Refill: 0  3. Numbness and tingling of left side of face See above medical treatment plan. - DG Facial Bones Complete; Future - methylPREDNISolone (MEDROL) 4 MG TBPK tablet; 6 day taper; take as directed on package instructions  Dispense: 21 tablet; Refill: 0     Mar Daring, PA-C  [facility]

## 2019-11-16 ENCOUNTER — Telehealth: Payer: Self-pay

## 2019-11-16 NOTE — Telephone Encounter (Signed)
Result Communications   Result Notes and Comments to Patient Comment seen by patient Krystal Simpson on 11/16/2019 11:48 AM

## 2019-11-16 NOTE — Telephone Encounter (Signed)
-----   Message from Mar Daring, [place] sent at 11/16/2019  8:20 AM EST ----- So currently the face xrays are normal, no fractures noted. Continue with the treatment we discussed. If not improving once steroid completed please call and we can discuss proceeding with a head CT.

## 2019-11-26 ENCOUNTER — Encounter: Payer: Self-pay | Admitting: Physician Assistant

## 2019-12-02 ENCOUNTER — Telehealth: Payer: Self-pay | Admitting: Emergency Medicine

## 2019-12-02 ENCOUNTER — Emergency Department
Admission: EM | Admit: 2019-12-02 | Discharge: 2019-12-02 | Disposition: A | Discharge status: Home / Self Care | Payer: Medicare HMO | Attending: Emergency Medicine | Admitting: Emergency Medicine

## 2019-12-02 ENCOUNTER — Telehealth: Payer: Self-pay

## 2019-12-02 ENCOUNTER — Emergency Department: Payer: Medicare HMO

## 2019-12-02 ENCOUNTER — Other Ambulatory Visit: Payer: Self-pay

## 2019-12-02 DIAGNOSIS — Z5321 Procedure and treatment not carried out due to patient leaving prior to being seen by health care provider: Secondary | ICD-10-CM | POA: Diagnosis not present

## 2019-12-02 DIAGNOSIS — R079 Chest pain, unspecified: Secondary | ICD-10-CM | POA: Diagnosis not present

## 2019-12-02 DIAGNOSIS — R05 Cough: Secondary | ICD-10-CM | POA: Diagnosis not present

## 2019-12-02 LAB — COMPREHENSIVE METABOLIC PANEL
ALT: 10 U/L (ref 0–44)
AST: 15 U/L (ref 15–41)
Albumin: 4.3 g/dL (ref 3.5–5.0)
Alkaline Phosphatase: 96 U/L (ref 38–126)
Anion gap: 8 (ref 5–15)
BUN: 19 mg/dL (ref 6–20)
CO2: 22 mmol/L (ref 22–32)
Calcium: 8.8 mg/dL — ABNORMAL LOW (ref 8.9–10.3)
Chloride: 109 mmol/L (ref 98–111)
Creatinine, Ser: 0.88 mg/dL (ref 0.44–1.00)
GFR calc Af Amer: >60 mL/min (ref >60)
GFR calc non Af Amer: >60 mL/min (ref >60)
Glucose, Bld: 109 mg/dL — ABNORMAL HIGH (ref 70–99)
Potassium: 3.5 mmol/L (ref 3.5–5.1)
Sodium: 139 mmol/L (ref 135–145)
Total Bilirubin: 0.6 mg/dL (ref 0.3–1.2)
Total Protein: 7.2 g/dL (ref 6.5–8.1)

## 2019-12-02 LAB — CBC
HCT: 40.9 % (ref 36.0–46.0)
Hemoglobin: 13.3 g/dL (ref 12.0–15.0)
MCH: 29.3 pg (ref 26.0–34.0)
MCHC: 32.5 g/dL (ref 30.0–36.0)
MCV: 90.1 fL (ref 80.0–100.0)
Platelets: 263 10*3/uL (ref 150–400)
RBC: 4.54 MIL/uL (ref 3.87–5.11)
RDW: 13.2 % (ref 11.5–15.5)
WBC: 16.4 10*3/uL — ABNORMAL HIGH (ref 4.0–10.5)
nRBC: 0 % (ref 0.0–0.2)

## 2019-12-02 LAB — TROPONIN I (HIGH SENSITIVITY): Troponin I (High Sensitivity): <2 ng/L (ref <18)

## 2019-12-02 NOTE — ED Triage Notes (Signed)
Pt in with co nonproductive cough for few weeks, also co headache, and sore throat. States has had loss of taste and appetite. Also co chest pressure denies any fever.

## 2019-12-02 NOTE — Telephone Encounter (Signed)
LMTCB Ok for the [facility] to relay message

## 2019-12-02 NOTE — Telephone Encounter (Signed)
Called patient due to lwot to inquire about condition and follow up plans. She will follow up with pcp

## 2019-12-02 NOTE — Telephone Encounter (Signed)
No we are not. She would need to go to an urgent care or we could schedule with the respiratory clinic in [place] on [address].

## 2019-12-02 NOTE — Telephone Encounter (Signed)
Copied from [place] [phone]. Topic: General - Inquiry >> Dec 02, 2019  2:56 PM Mathis Bud wrote: Reason for CRM: Patient states she needs a strep test done.  Patient went to ER last night for fever and bad taste in her mouth.  Patient states only thing she is experiencing today. I asked patient if she would like a HFU, patient states HFU just told her to get strep test done.  Call back [phone]

## 2019-12-02 NOTE — Telephone Encounter (Signed)
Please Review. We are not doing strep test yet right?

## 2019-12-10 DIAGNOSIS — F5081 Binge eating disorder: Secondary | ICD-10-CM | POA: Diagnosis not present

## 2019-12-10 DIAGNOSIS — R69 Illness, unspecified: Secondary | ICD-10-CM | POA: Diagnosis not present

## 2019-12-10 DIAGNOSIS — F411 Generalized anxiety disorder: Secondary | ICD-10-CM | POA: Diagnosis not present

## 2019-12-10 DIAGNOSIS — F4312 Post-traumatic stress disorder, chronic: Secondary | ICD-10-CM | POA: Diagnosis not present

## 2019-12-12 ENCOUNTER — Other Ambulatory Visit: Payer: Self-pay | Admitting: Physician Assistant

## 2019-12-12 DIAGNOSIS — M7661 Achilles tendinitis, right leg: Secondary | ICD-10-CM

## 2019-12-12 DIAGNOSIS — M533 Sacrococcygeal disorders, not elsewhere classified: Secondary | ICD-10-CM

## 2019-12-12 DIAGNOSIS — M7662 Achilles tendinitis, left leg: Secondary | ICD-10-CM

## 2019-12-12 DIAGNOSIS — M545 Low back pain, unspecified: Secondary | ICD-10-CM

## 2019-12-13 MED ORDER — HYDROCODONE-ACETAMINOPHEN 10-325 MG PO TABS: 1.0000 | ORAL_TABLET | ORAL | 0 refills | Status: DC | PRN

## 2019-12-21 ENCOUNTER — Telehealth: Payer: Self-pay | Admitting: Physician Assistant

## 2019-12-21 DIAGNOSIS — K1379 Other lesions of oral mucosa: Secondary | ICD-10-CM | POA: Diagnosis not present

## 2019-12-21 DIAGNOSIS — J029 Acute pharyngitis, unspecified: Secondary | ICD-10-CM | POA: Diagnosis not present

## 2019-12-21 DIAGNOSIS — W19XXXD Unspecified fall, subsequent encounter: Secondary | ICD-10-CM

## 2019-12-21 DIAGNOSIS — R202 Paresthesia of skin: Secondary | ICD-10-CM

## 2019-12-21 DIAGNOSIS — R519 Headache, unspecified: Secondary | ICD-10-CM

## 2019-12-21 DIAGNOSIS — R200 Anesthesia of skin: Secondary | ICD-10-CM

## 2019-12-21 NOTE — Telephone Encounter (Signed)
Pt was advised if she still having issues with her eye, she could get CAT Scan / Pt wanted to now if the scan would also show if she has some issues with her teeth/or if she needs to go to the dentist / please advise

## 2019-12-23 NOTE — Telephone Encounter (Signed)
The CT should see the teeth and would document if something is going on, but would not hurt for her to follow up with her dentist if something is going on so they are aware also.  CT will be ordered.

## 2019-12-23 NOTE — Telephone Encounter (Signed)
Tried calling pt. No answer and no vm.  

## 2019-12-24 NOTE — Telephone Encounter (Signed)
Patient advised as directed below. She also mentioned that she scheduled an appointment with the Orthodontist and they will probably do an xray and that she will hold off on the CT scan. I let the patient know that they are going to call her to schedule the CT scan and that she can let them know.

## 2020-01-03 ENCOUNTER — Encounter: Payer: Medicare HMO | Admitting: Physician Assistant

## 2020-01-03 NOTE — Progress Notes (Deleted)
Complete physical exam   {  This note template is in development as part of the [facility].   This template contains optional SmartLists. If no selections are made from an optional SmartList, it will be automatically removed when the note is signed.  Left clicking SmartLists that are in blue, underlined text will show additional information regarding the patient's history unless you are already editing the note in a pop-up window.  This text will be automatically removed from this note when it is signed.:1}   Patient: Krystal Simpson   DOB: [birth year]   59 y.o. Female  MRN: [id number] Visit Date: 01/03/2020  Today's healthcare provider: Mar Daring, PA-C  Subjective:   No chief complaint on file.   NOLAN SANDEFER is a 59 y.o. female who presents today for a complete physical exam. HPI  Patient had AWV with [facility] 10/21/19.  Past Medical History:  Diagnosis Date  . Abnormal Pap smear of cervix   . Anxiety   . 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 Depression   . History of kidney stones    Past Surgical History:  Procedure Laterality Date  . BASAL CELL CARCINOMA EXCISION  2014  . BASAL CELL CARCINOMA EXCISION  09/20/2015   Dr. Evorn Gong, left mid forearm  . MOHS SURGERY  2015   top of head Dr. Shelda Jakes at [facility]  . SQUAMOUS CELL CARCINOMA EXCISION  2014   Social History   Socioeconomic History  . Marital status: Married    Spouse name: Abbe Amsterdam  . Number of children: 2  . Years of education: some college  . Highest education level: Some college, no degree  Occupational History  . Occupation: disabled  Tobacco Use  . Smoking status: Never Smoker  . Smokeless tobacco: Never Used  Substance and Sexual Activity  . Alcohol use: No    Alcohol/week: 0.0 standard drinks  . Drug use: No  . Sexual activity: Not Currently  Other Topics Concern  . 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    Social Determinants of Health   Financial Resource Strain: Low Risk   . Difficulty of Paying Living Expenses: Not hard at all  Food Insecurity: No Food Insecurity  . Worried About Charity fundraiser in the Last Year: Never true  . Ran Out of Food in the Last Year: Never true  Transportation Needs: No Transportation Needs  . Lack of Transportation (Medical): No  . Lack of Transportation (Non-Medical): No  Physical Activity: Inactive  . Days of Exercise per Week: 0 days  . Minutes of Exercise per Session: 0 min  Stress: Stress Concern Present  . Feeling of Stress : To some extent  Social Connections: Somewhat Isolated  . Frequency of Communication with Friends and Family: More than three times a week  . Frequency of Social Gatherings with Friends and Family: Three times a week  . Attends Religious Services: Never  . Active Member of Clubs or Organizations: No  . Attends Archivist Meetings: Never  . Marital Status: Married  Human resources officer Violence: Not At Risk  . Fear of Current or Ex-Partner: No  . Emotionally Abused: No  . Physically Abused: No  . Sexually Abused: No   Family Status  Relation Name Status  . Mother  Deceased at age 31  . Father  Deceased at  age 66  . Sister  Alive  . MGM  Deceased at age 66       MI  . Sister HALF SISTER Alive   Family History  Problem Relation Age of Onset  . Emphysema Mother   . Lung cancer Father   . Healthy Sister   . Heart attack Sister 26  . Heart attack Maternal Grandmother   . Breast cancer Sister    Allergies  Allergen Reactions  . Cipro [Ciprofloxacin-Ciproflox Hcl Er] Other (See Comments)  . Ciprofloxacin     Other reaction(s): Joint Pains  . Tetanus Toxoids Hives  . Tetanus Toxoids Rash    Patient Care Team: Mar Daring, PA-C as PCP - General (Family Medicine) Arelia Sneddon, [place] as Consulting Physician (Optometry) Manley Mason, Donivan Scull, MD as Referring Physician  (Dermatology) Oneta Rack, MD as Consulting Physician (Dermatology) Chauncey Mann, MD as Referring Physician (Psychiatry) Ruthy Dick, LCSW as Social Worker (Professional Counselor) Sharlet Salina, MD as Referring Physician (Physical Medicine and Rehabilitation) Meeler, Sherren Kerns, FNP (Family Medicine) Anabel Bene, MD as Referring Physician (Neurology) Jannifer Franklin, NP as Nurse Practitioner (Neurology) Carloyn Manner, MD as Referring Physician (Otolaryngology)   Medications: Outpatient Medications Prior to Visit  Medication Sig  . atorvastatin (LIPITOR) 10 MG tablet Take 1 tablet (10 mg total) by mouth daily. (Patient not taking: Reported on 04/16/2018)  . azelastine (ASTELIN) 0.1 % nasal spray Place 2 sprays into both nostrils 2 (two) times daily. Use in each nostril as directed  . baclofen (LIORESAL) 10 MG tablet Take 1 tablet (10 mg total) by mouth 3 (three) times daily. (Patient not taking: Reported on 10/21/2019)  . Cholecalciferol 1000 UNITS capsule Take 1,000 Units by mouth daily.   . clonazePAM (KLONOPIN) 0.25 MG disintegrating tablet Take 0.25 mg by mouth daily.   . cyanocobalamin 1000 MCG tablet Take 1,000 mcg by mouth daily.   . diclofenac (VOLTAREN) 75 MG EC tablet Take 75 mg by mouth 2 (two) times daily.  . DULoxetine (CYMBALTA) 30 MG capsule Take 90 mg by mouth daily.   Eduard Roux (AIMOVIG) 140 MG/ML SOAJ Inject into the skin.  . famotidine (PEPCID) 20 MG tablet Take 1 tablet (20 mg total) by mouth 2 (two) times daily. (Patient not taking: Reported on 06/01/2018)  . Flaxseed, Linseed, (FLAX SEEDS PO) Take 1 capsule by mouth daily.  . Fremanezumab-vfrm (AJOVY) 225 MG/1.5ML SOSY Inject into the skin.  Marland Kitchen gabapentin (NEURONTIN) 300 MG capsule Take 1 capsule (300 mg total) by mouth 2 (two) times daily. (Patient not taking: Reported on 10/21/2019)  . HYDROcodone-acetaminophen (NORCO) 10-325 MG tablet Take 1 tablet by mouth every 4 (four) hours as needed.   . lamoTRIgine (LAMICTAL) 200 MG tablet Take 200 mg by mouth daily.  Marland Kitchen lisdexamfetamine (VYVANSE) 50 MG capsule Take 1 capsule (50 mg total) by mouth daily.  . meloxicam (MOBIC) 15 MG tablet Take 15 mg by mouth daily.  . meloxicam (MOBIC) 7.5 MG tablet Take 1 tablet (7.5 mg total) by mouth daily. (Patient not taking: Reported on 10/28/2018)  . methylPREDNISolone (MEDROL) 4 MG TBPK tablet 6 day taper; take as directed on package instructions  . MULTIPLE VITAMINS-MINERALS PO Take 1 tablet by mouth daily.   . naloxone (NARCAN) nasal spray 4 mg/0.1 mL Place 1 spray into the nose. If needed  . nystatin cream (MYCOSTATIN) Apply 1 application topically 2 (two) times daily.  . Omega-3 Fatty Acids (FISH OIL) 1000 MG CAPS Take 1 capsule by mouth  daily.   Marland Kitchen OVER THE COUNTER MEDICATION Numbing cream for neck pain as needed  . pantoprazole (PROTONIX) 40 MG tablet Take 40 mg by mouth daily.  . Polyethylene Glycol 3350 (MIRALAX PO) Take 17 g by mouth daily.   . promethazine (PHENERGAN) 25 MG tablet Take 1 tablet (25 mg total) by mouth every 8 (eight) hours as needed for nausea or vomiting.  . sertraline (ZOLOFT) 100 MG tablet Take 100 mg by mouth 2 (two) times daily.   . SUMAtriptan (IMITREX) 100 MG tablet TAKE 1 AT HEADACHE ONSET, MAY REPEAT AFTER 2 HOURS, NO MORE THAN 2 IN A DAY,NO MORE THAN 4 IN A WEEK  . terbinafine (LAMISIL) 250 MG tablet Take 250 mg by mouth daily.  . Tetrahydrozoline HCl (EYE DROPS OP) Apply 1 drop to eye 2 (two) times daily.   Marland Kitchen topiramate (TOPAMAX) 100 MG tablet Take 100 mg by mouth daily.  Marland Kitchen topiramate (TOPAMAX) 25 MG tablet Take 20-100 mg by mouth 2 (two) times daily. Take 25 mg by mouth in the morning and 100 mg by mouth at bedtime.  . topiramate (TOPAMAX) 50 MG tablet 50 mg 2 (two) times daily. 100 mg in am and 50 mg in pm  . traMADol (ULTRAM) 50 MG tablet Take by mouth at bedtime as needed.  . traZODone (DESYREL) 50 MG tablet Take 100 mg by mouth at bedtime as needed for sleep.    Marland Kitchen venlafaxine XR (EFFEXOR-XR) 75 MG 24 hr capsule Take 75 mg by mouth daily with breakfast.   No facility-administered medications prior to visit.    Review of Systems  {Show previous labs (optional):23779::" "}    Objective:    There were no vitals taken for this visit. {Show previous vital signs (optional):23777::" "}  Physical Exam  ***  Depression Screen  PHQ 2/9 Scores 10/21/2019 10/21/2019 10/16/2018  PHQ - 2 Score 0 0 3  PHQ- 9 Score - - 14  Exception Documentation - - -   Fall Risk  10/21/2019 10/16/2018 06/01/2018 10/06/2017 06/03/2017  Falls in the past year? 1 1 Yes Yes Yes  Number falls in past yr: 1 1 2  or more 1 2 or more  Comment - - - - walking dog; tripped on roots  Injury with Fall? 0 1 Yes No No  Comment - - Lower back - -  Risk Factor Category  - - - - -  Risk for fall due to : - - - - -  Follow up Falls prevention discussed Falls prevention discussed - Falls prevention discussed -   6CIT Screen 10/21/2019 10/06/2017  What Year? 0 points 0 points  What month? 0 points 0 points  What time? 0 points 0 points  Count back from 20 0 points 0 points  Months in reverse 0 points 0 points  Repeat phrase 0 points 2 points  Total Score 0 2    No results found for any visits on 01/03/20.    Assessment & Plan:    Routine Health Maintenance and Physical Exam  Exercise Activities and Dietary recommendations Goals    . Exercise 150 minutes per week (moderate activity)     10/06/17- Pt to increase the amount of exercise done. Pt to start walking for 3-4 days a week for at least 30 minutes at a time.     Marland Kitchen LIFESTYLE - DECREASE FALLS RISK     Recommend to remove any items from the home that may cause slips or trips.  Immunization History  Administered Date(s) Administered  . Influenza, Seasonal, Injecte, Preservative Fre 06/24/2015  . Influenza,inj,Quad PF,6+ Mos 10/04/2016, 06/02/2017, 06/04/2018, 06/08/2019  . Zoster Recombinat (Shingrix) 06/08/2019     Health Maintenance  Topic Date Due  . INFLUENZA VACCINE  04/23/2020  . MAMMOGRAM  04/24/2020  . PAP SMEAR-Modifier  10/09/2020  . COLONOSCOPY  08/17/2023  . TETANUS/TDAP  10/04/2026  . Hepatitis C Screening  Completed  . HIV Screening  Completed    Discussed health benefits of physical activity, and encouraged her to engage in regular exercise appropriate for her age and condition.   ***     Mar Daring, PA-C  [facility] [phone] (phone) [phone] (fax)  [place]

## 2020-01-24 ENCOUNTER — Other Ambulatory Visit: Payer: Self-pay | Admitting: Physician Assistant

## 2020-01-24 DIAGNOSIS — M545 Low back pain, unspecified: Secondary | ICD-10-CM

## 2020-01-24 DIAGNOSIS — M7661 Achilles tendinitis, right leg: Secondary | ICD-10-CM

## 2020-01-24 DIAGNOSIS — M533 Sacrococcygeal disorders, not elsewhere classified: Secondary | ICD-10-CM

## 2020-01-24 MED ORDER — HYDROCODONE-ACETAMINOPHEN 10-325 MG PO TABS: 1.0000 | ORAL_TABLET | ORAL | 0 refills | Status: DC | PRN

## 2020-02-25 DIAGNOSIS — F4312 Post-traumatic stress disorder, chronic: Secondary | ICD-10-CM | POA: Diagnosis not present

## 2020-02-25 DIAGNOSIS — F5081 Binge eating disorder: Secondary | ICD-10-CM | POA: Diagnosis not present

## 2020-02-25 DIAGNOSIS — F411 Generalized anxiety disorder: Secondary | ICD-10-CM | POA: Diagnosis not present

## 2020-02-25 DIAGNOSIS — R69 Illness, unspecified: Secondary | ICD-10-CM | POA: Diagnosis not present

## 2020-03-15 ENCOUNTER — Other Ambulatory Visit: Payer: Self-pay | Admitting: Physician Assistant

## 2020-03-15 DIAGNOSIS — M533 Sacrococcygeal disorders, not elsewhere classified: Secondary | ICD-10-CM

## 2020-03-15 DIAGNOSIS — M7662 Achilles tendinitis, left leg: Secondary | ICD-10-CM

## 2020-03-15 DIAGNOSIS — M545 Low back pain, unspecified: Secondary | ICD-10-CM

## 2020-03-15 DIAGNOSIS — M7661 Achilles tendinitis, right leg: Secondary | ICD-10-CM

## 2020-03-15 MED ORDER — HYDROCODONE-ACETAMINOPHEN 10-325 MG PO TABS: 1.0000 | ORAL_TABLET | ORAL | 0 refills | Status: DC | PRN

## 2020-03-23 DIAGNOSIS — F5081 Binge eating disorder: Secondary | ICD-10-CM | POA: Diagnosis not present

## 2020-03-23 DIAGNOSIS — F411 Generalized anxiety disorder: Secondary | ICD-10-CM | POA: Diagnosis not present

## 2020-03-23 DIAGNOSIS — R69 Illness, unspecified: Secondary | ICD-10-CM | POA: Diagnosis not present

## 2020-03-23 DIAGNOSIS — F4312 Post-traumatic stress disorder, chronic: Secondary | ICD-10-CM | POA: Diagnosis not present

## 2020-05-25 DIAGNOSIS — R69 Illness, unspecified: Secondary | ICD-10-CM | POA: Diagnosis not present

## 2020-05-25 DIAGNOSIS — F411 Generalized anxiety disorder: Secondary | ICD-10-CM | POA: Diagnosis not present

## 2020-05-25 DIAGNOSIS — F4312 Post-traumatic stress disorder, chronic: Secondary | ICD-10-CM | POA: Diagnosis not present

## 2020-05-25 DIAGNOSIS — F5081 Binge eating disorder: Secondary | ICD-10-CM | POA: Diagnosis not present

## 2020-06-20 ENCOUNTER — Other Ambulatory Visit: Payer: Self-pay | Admitting: Family Medicine

## 2020-06-20 DIAGNOSIS — M545 Low back pain, unspecified: Secondary | ICD-10-CM

## 2020-06-20 DIAGNOSIS — M533 Sacrococcygeal disorders, not elsewhere classified: Secondary | ICD-10-CM

## 2020-06-20 DIAGNOSIS — M7661 Achilles tendinitis, right leg: Secondary | ICD-10-CM

## 2020-06-20 DIAGNOSIS — M7662 Achilles tendinitis, left leg: Secondary | ICD-10-CM

## 2020-06-20 MED ORDER — HYDROCODONE-ACETAMINOPHEN 10-325 MG PO TABS: 1.0000 | ORAL_TABLET | ORAL | 0 refills | Status: DC | PRN

## 2020-07-04 DIAGNOSIS — R251 Tremor, unspecified: Secondary | ICD-10-CM | POA: Diagnosis not present

## 2020-07-04 DIAGNOSIS — M545 Low back pain, unspecified: Secondary | ICD-10-CM | POA: Diagnosis not present

## 2020-07-04 DIAGNOSIS — G8929 Other chronic pain: Secondary | ICD-10-CM | POA: Insufficient documentation

## 2020-07-04 DIAGNOSIS — G479 Sleep disorder, unspecified: Secondary | ICD-10-CM | POA: Diagnosis not present

## 2020-07-04 DIAGNOSIS — G43109 Migraine with aura, not intractable, without status migrainosus: Secondary | ICD-10-CM | POA: Diagnosis not present

## 2020-08-14 ENCOUNTER — Other Ambulatory Visit: Payer: Self-pay | Admitting: Physician Assistant

## 2020-08-14 DIAGNOSIS — M533 Sacrococcygeal disorders, not elsewhere classified: Secondary | ICD-10-CM

## 2020-08-14 DIAGNOSIS — M7661 Achilles tendinitis, right leg: Secondary | ICD-10-CM

## 2020-08-14 DIAGNOSIS — M7662 Achilles tendinitis, left leg: Secondary | ICD-10-CM

## 2020-08-14 DIAGNOSIS — M545 Low back pain, unspecified: Secondary | ICD-10-CM

## 2020-08-14 MED ORDER — HYDROCODONE-ACETAMINOPHEN 10-325 MG PO TABS: 1.0000 | ORAL_TABLET | ORAL | 0 refills | Status: DC | PRN

## 2020-08-24 DIAGNOSIS — F4312 Post-traumatic stress disorder, chronic: Secondary | ICD-10-CM | POA: Diagnosis not present

## 2020-08-24 DIAGNOSIS — F411 Generalized anxiety disorder: Secondary | ICD-10-CM | POA: Diagnosis not present

## 2020-08-24 DIAGNOSIS — R69 Illness, unspecified: Secondary | ICD-10-CM | POA: Diagnosis not present

## 2020-08-24 DIAGNOSIS — F5081 Binge eating disorder: Secondary | ICD-10-CM | POA: Diagnosis not present

## 2020-10-04 DIAGNOSIS — M5489 Other dorsalgia: Secondary | ICD-10-CM | POA: Diagnosis not present

## 2020-10-04 DIAGNOSIS — M545 Low back pain, unspecified: Secondary | ICD-10-CM | POA: Diagnosis not present

## 2020-10-04 DIAGNOSIS — G43109 Migraine with aura, not intractable, without status migrainosus: Secondary | ICD-10-CM | POA: Diagnosis not present

## 2020-10-04 DIAGNOSIS — G8929 Other chronic pain: Secondary | ICD-10-CM | POA: Diagnosis not present

## 2020-10-04 DIAGNOSIS — R251 Tremor, unspecified: Secondary | ICD-10-CM | POA: Diagnosis not present

## 2020-10-04 DIAGNOSIS — G479 Sleep disorder, unspecified: Secondary | ICD-10-CM | POA: Diagnosis not present

## 2020-10-18 ENCOUNTER — Telehealth: Payer: Self-pay | Admitting: Physician Assistant

## 2020-10-18 NOTE — Telephone Encounter (Signed)
Copied from [place] [phone]. Topic: Medicare AWV >> Oct 18, 2020 12:20 PM Weston Anna wrote: Reason for CRM:   Left patient a message : AWVS scheduled Feb 3,2022 has been cancelled and rescheduled to Feb 2,2022 at 2:40pm due to [facility] out of the office

## 2020-10-24 NOTE — Progress Notes (Signed)
Subjective:   Krystal Simpson is a 60 y.o. female who presents for Medicare Annual (Subsequent) preventive examination.  I connected with Archer Pajares today by telephone and verified that I am speaking with the correct person using two identifiers. Location patient: home Location provider: work Persons participating in the virtual visit: patient, provider.   I discussed the limitations, risks, security and privacy concerns of performing an evaluation and management service by telephone and the availability of in person appointments. I also discussed with the patient that there may be a patient responsible charge related to this service. The patient expressed understanding and verbally consented to this telephonic visit.    Interactive audio and video telecommunications were attempted between this provider and patient, however failed, due to patient having technical difficulties OR patient did not have access to video capability.  We continued and completed visit with audio only.   Review of Systems    N/A  Cardiac Risk Factors include: none     Objective:    There were no vitals filed for this visit. There is no height or weight on file to calculate BMI.  Advanced Directives 10/25/2020 12/02/2019 10/21/2019 10/16/2018 10/16/2018 01/27/2018 10/06/2017  Does Patient Have a Medical Advance Directive? No No No No No No No  Does patient want to make changes to medical advance directive? - - - Yes (MAU/Ambulatory/Procedural Areas - Information given) Yes (MAU/Ambulatory/Procedural Areas - Information given) - -  Copy of [place] in Chart? - - - - - - -  Would patient like information on creating a medical advance directive? No - Patient declined - No - Patient declined - No - Patient declined No - Patient declined Yes (MAU/Ambulatory/Procedural Areas - Information given)    Current Medications (verified) Outpatient Encounter Medications as of 10/25/2020  Medication Sig  .  azelastine (ASTELIN) 0.1 % nasal spray Place 2 sprays into both nostrils 2 (two) times daily. Use in each nostril as directed  . Cholecalciferol 1000 UNITS capsule Take 1,000 Units by mouth daily.   . clonazePAM (KLONOPIN) 0.25 MG disintegrating tablet Take 0.25 mg by mouth daily.   . cyanocobalamin 1000 MCG tablet Take 1,000 mcg by mouth daily.   Eduard Roux (AIMOVIG) 140 MG/ML SOAJ Inject into the skin every 30 (thirty) days.  . Flaxseed, Linseed, (FLAX SEEDS PO) Take 1 capsule by mouth daily.  Marland Kitchen HYDROcodone-acetaminophen (NORCO) 10-325 MG tablet Take 1 tablet by mouth every 4 (four) hours as needed.  Marland Kitchen lisdexamfetamine (VYVANSE) 50 MG capsule Take 1 capsule (50 mg total) by mouth daily.  . MULTIPLE VITAMINS-MINERALS PO Take 1 tablet by mouth daily.   . naloxone (NARCAN) nasal spray 4 mg/0.1 mL Place 1 spray into the nose. If needed  . Omega-3 Fatty Acids (FISH OIL) 1000 MG CAPS Take 1 capsule by mouth daily.   Marland Kitchen OVER THE COUNTER MEDICATION Numbing cream for neck pain as needed  . Polyethylene Glycol 3350 (MIRALAX PO) Take 17 g by mouth daily.   . promethazine (PHENERGAN) 25 MG tablet Take 1 tablet (25 mg total) by mouth every 8 (eight) hours as needed for nausea or vomiting.  . sertraline (ZOLOFT) 100 MG tablet Take 100 mg by mouth 2 (two) times daily.   . SUMAtriptan (IMITREX) 100 MG tablet TAKE 1 AT HEADACHE ONSET, MAY REPEAT AFTER 2 HOURS, NO MORE THAN 2 IN A DAY,NO MORE THAN 4 IN A WEEK  . topiramate (TOPAMAX) 50 MG tablet 50 mg 2 (two) times daily.  100 mg in am and 50 mg in pm  . traZODone (DESYREL) 50 MG tablet Take 100 mg by mouth at bedtime as needed for sleep.  Marland Kitchen venlafaxine XR (EFFEXOR-XR) 150 MG 24 hr capsule Take 150 mg by mouth daily.  Marland Kitchen atorvastatin (LIPITOR) 10 MG tablet Take 1 tablet (10 mg total) by mouth daily. (Patient not taking: No sig reported)  . baclofen (LIORESAL) 10 MG tablet Take 1 tablet (10 mg total) by mouth 3 (three) times daily. (Patient not taking: No sig  reported)  . diclofenac (VOLTAREN) 75 MG EC tablet Take 75 mg by mouth 2 (two) times daily. (Patient not taking: Reported on 10/25/2020)  . DULoxetine (CYMBALTA) 30 MG capsule Take 90 mg by mouth daily.  (Patient not taking: Reported on 10/25/2020)  . famotidine (PEPCID) 20 MG tablet Take 1 tablet (20 mg total) by mouth 2 (two) times daily. (Patient not taking: Reported on 06/01/2018)  . Fremanezumab-vfrm 225 MG/1.5ML SOSY Inject into the skin. (Patient not taking: Reported on 10/25/2020)  . gabapentin (NEURONTIN) 300 MG capsule Take 1 capsule (300 mg total) by mouth 2 (two) times daily. (Patient not taking: No sig reported)  . lamoTRIgine (LAMICTAL) 200 MG tablet Take 200 mg by mouth daily. (Patient not taking: Reported on 10/25/2020)  . meloxicam (MOBIC) 15 MG tablet Take 15 mg by mouth daily. (Patient not taking: Reported on 10/25/2020)  . meloxicam (MOBIC) 7.5 MG tablet Take 1 tablet (7.5 mg total) by mouth daily. (Patient not taking: No sig reported)  . methylPREDNISolone (MEDROL) 4 MG TBPK tablet 6 day taper; take as directed on package instructions (Patient not taking: Reported on 10/25/2020)  . nystatin cream (MYCOSTATIN) Apply 1 application topically 2 (two) times daily. (Patient not taking: Reported on 10/25/2020)  . pantoprazole (PROTONIX) 40 MG tablet Take 40 mg by mouth daily. (Patient not taking: Reported on 10/25/2020)  . terbinafine (LAMISIL) 250 MG tablet Take 250 mg by mouth daily. (Patient not taking: Reported on 10/25/2020)  . Tetrahydrozoline HCl (EYE DROPS OP) Apply 1 drop to eye 2 (two) times daily.  (Patient not taking: Reported on 10/25/2020)  . topiramate (TOPAMAX) 100 MG tablet Take 100 mg by mouth daily. (Patient not taking: Reported on 10/25/2020)  . topiramate (TOPAMAX) 25 MG tablet Take 20-100 mg by mouth 2 (two) times daily. Take 25 mg by mouth in the morning and 100 mg by mouth at bedtime. (Patient not taking: Reported on 10/25/2020)  . traMADol (ULTRAM) 50 MG tablet Take by mouth at bedtime  as needed. (Patient not taking: Reported on 10/25/2020)  . venlafaxine XR (EFFEXOR-XR) 75 MG 24 hr capsule Take 150 mg by mouth daily with breakfast. (Patient not taking: Reported on 10/25/2020)   No facility-administered encounter medications on file as of 10/25/2020.    Allergies (verified) Cipro [ciprofloxacin-ciproflox hcl er], Ciprofloxacin, Tetanus toxoids, and Tetanus toxoids   History: Past Medical History:  Diagnosis Date  . Abnormal Pap smear of cervix   . Anxiety   . 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 Depression   . History of kidney stones    Past Surgical History:  Procedure Laterality Date  . BASAL CELL CARCINOMA EXCISION  2014  . BASAL CELL CARCINOMA EXCISION  09/20/2015   Dr. Evorn Gong, left mid forearm  . MOHS SURGERY  2015   top of head Dr. Shelda Jakes at [facility]  . SQUAMOUS CELL CARCINOMA EXCISION  2014   Family History  Problem  Relation Age of Onset  . Emphysema Mother   . Lung cancer Father   . Healthy Sister   . Heart attack Sister 59  . Heart attack Maternal Grandmother   . Breast cancer Sister    Social History   Socioeconomic History  . Marital status: Married    Spouse name: Abbe Amsterdam  . Number of children: 2  . Years of education: some college  . Highest education level: Some college, no degree  Occupational History  . Occupation: disabled  Tobacco Use  . Smoking status: Never Smoker  . Smokeless tobacco: Never Used  Vaping Use  . Vaping Use: Never used  Substance and Sexual Activity  . Alcohol use: No    Alcohol/week: 0.0 standard drinks  . Drug use: No  . Sexual activity: Not Currently  Other Topics Concern  . 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    Social Determinants of Health   Financial Resource Strain: Low Risk   . Difficulty of Paying Living Expenses: Not hard at all  Food Insecurity: No Food Insecurity  . Worried About Charity fundraiser in the Last  Year: Never true  . Ran Out of Food in the Last Year: Never true  Transportation Needs: No Transportation Needs  . Lack of Transportation (Medical): No  . Lack of Transportation (Non-Medical): No  Physical Activity: Inactive  . Days of Exercise per Week: 0 days  . Minutes of Exercise per Session: 0 min  Stress: Stress Concern Present  . Feeling of Stress : Rather much  Social Connections: Moderately Isolated  . Frequency of Communication with Friends and Family: More than three times a week  . Frequency of Social Gatherings with Friends and Family: More than three times a week  . Attends Religious Services: Never  . Active Member of Clubs or Organizations: No  . Attends Archivist Meetings: Never  . Marital Status: Married    Tobacco Counseling Counseling given: Not Answered   Clinical Intake:  Pre-visit preparation completed: Yes  Pain : No/denies pain     Nutritional Risks: None Diabetes: No  How often do you need to have someone help you when you read instructions, pamphlets, or other written materials from your doctor or pharmacy?: 1 - Never  Diabetic? No  Interpreter Needed?: No  Information entered by :: [facility], LPN   Activities of Daily Living In your present state of health, do you have any difficulty performing the following activities: 10/25/2020  Hearing? N  Vision? N  Difficulty concentrating or making decisions? N  Walking or climbing stairs? N  Dressing or bathing? N  Doing errands, shopping? N  Preparing Food and eating ? N  Using the Toilet? N  In the past six months, have you accidently leaked urine? N  Do you have problems with loss of bowel control? N  Managing your Medications? N  Managing your Finances? N  Housekeeping or managing your Housekeeping? N  Some recent data might be hidden    Patient Care Team: Mar Daring, PA-C as PCP - General (Family Medicine) Arelia Sneddon, [place] as Consulting Physician  (Optometry) Manley Mason, Donivan Scull, MD as Referring Physician (Dermatology) Oneta Rack, MD as Consulting Physician (Dermatology) Chauncey Mann, MD as Referring Physician (Psychiatry) Sharlet Salina, MD as Referring Physician (Physical Medicine and Rehabilitation) Meeler, Sherren Kerns, FNP (Family Medicine) Anabel Bene, MD as Referring Physician (Neurology) Jannifer Franklin, NP as Nurse  Practitioner (Neurology) Bud Face, MD as Referring Physician (Otolaryngology)  Indicate any recent Medical Services you may have received from other than [facility] providers in the past year (date may be approximate).     Assessment:   This is a routine wellness examination for Meara.  Hearing/Vision screen No exam data present  Dietary issues and exercise activities discussed: Current Exercise Habits: The patient does not participate in regular exercise at present, Exercise limited by: None identified  Goals    . Exercise 150 minutes per week (moderate activity)     10/06/17- Pt to increase the amount of exercise done. Pt to start walking for 3-4 days a week for at least 30 minutes at a time.       Depression Screen PHQ 2/9 Scores 10/25/2020 10/21/2019 10/21/2019 10/16/2018 10/06/2017 12/26/2016 10/04/2016  PHQ - 2 Score 0 0 0 3 6 0 2  PHQ- 9 Score - - - 14 18 - 13  Exception Documentation - - - - - Other- indicate reason in comment box -    Fall Risk Fall Risk  10/25/2020 10/21/2019 10/16/2018 06/01/2018 10/06/2017  Falls in the past year? 0 1 1 Yes Yes  Number falls in past yr: 0 1 1 2  or more 1  Comment - - - - -  Injury with Fall? 0 0 1 Yes No  Comment - - - Lower back -  Risk Factor Category  - - - - -  Risk for fall due to : - - - - -  Follow up - Falls prevention discussed Falls prevention discussed - Falls prevention discussed    FALL RISK PREVENTION PERTAINING TO THE HOME:  Any stairs in or around the home? Yes  If so, are there any without handrails? No  Home free of loose  throw rugs in walkways, pet beds, electrical cords, etc? Yes  Adequate lighting in your home to reduce risk of falls? Yes   ASSISTIVE DEVICES UTILIZED TO PREVENT FALLS:  Life alert? No  Use of a cane, walker or w/c? No  Grab bars in the bathroom? Yes   Shower chair or bench in shower? Yes  Elevated toilet seat or a handicapped toilet? Yes    Cognitive Function:     6CIT Screen 10/21/2019 10/06/2017  What Year? 0 points 0 points  What month? 0 points 0 points  What time? 0 points 0 points  Count back from 20 0 points 0 points  Months in reverse 0 points 0 points  Repeat phrase 0 points 2 points  Total Score 0 2    Immunizations Immunization History  Administered Date(s) Administered  . Influenza, Seasonal, Injecte, Preservative Fre 06/24/2015  . Influenza,inj,Quad PF,6+ Mos 10/04/2016, 06/02/2017, 06/04/2018, 06/08/2019  . Moderna Sars-Covid-2 Vaccination 02/01/2020, 03/02/2020, 09/21/2020  . Zoster Recombinat (Shingrix) 06/08/2019    TDAP status: Up to date  Flu Vaccine status: Declined, Education has been provided regarding the importance of this vaccine but patient still declined. Advised may receive this vaccine at local pharmacy or Health Dept. Aware to provide a copy of the vaccination record if obtained from local pharmacy or Health Dept. Verbalized acceptance and understanding.  Covid-19 vaccine status: Completed vaccines  Qualifies for Shingles Vaccine? Yes   Zostavax completed No   Shingrix Completed?: No.    Education has been provided regarding the importance of this vaccine. Patient has been advised to call insurance company to determine out of pocket expense if they have not yet received this vaccine. Advised may also  receive vaccine at local pharmacy or Health Dept. Verbalized acceptance and understanding.  Screening Tests Health Maintenance  Topic Date Due  . MAMMOGRAM  04/24/2020  . PAP SMEAR-Modifier  10/09/2020  . INFLUENZA VACCINE  12/21/2020  (Originally 04/23/2020)  . COVID-19 Vaccine (4 - Booster for Moderna series) 03/22/2021  . COLONOSCOPY (Pts 45-60yrs Insurance coverage will need to be confirmed)  08/17/2023  . TETANUS/TDAP  10/04/2026  . Hepatitis C Screening  Completed  . HIV Screening  Completed    Health Maintenance  Health Maintenance Due  Topic Date Due  . MAMMOGRAM  04/24/2020  . PAP SMEAR-Modifier  10/09/2020    Colorectal cancer screening: Type of screening: Colonoscopy. Completed 08/16/13. Repeat every 10 years  Mammogram status: Ordered today. Pt provided with contact info and advised to call to schedule appt.    Lung Cancer Screening: (Low Dose CT Chest recommended if Age 86-80 years, 30 pack-year currently smoking OR have quit w/in 15years.) does not qualify.   Additional Screening:  Hepatitis C Screening: Up to date  Vision Screening: Recommended annual ophthalmology exams for early detection of glaucoma and other disorders of the eye. Is the patient up to date with their annual eye exam?  Yes  Who is the provider or what is the name of the office in which the patient attends annual eye exams? Dr Annamaria Helling If pt is not established with a provider, would they like to be referred to a provider to establish care? No .   Dental Screening: Recommended annual dental exams for proper oral hygiene  Community Resource Referral / Chronic Care Management: CRR required this visit?  No   CCM required this visit?  No      Plan:     I have personally reviewed and noted the following in the patient's chart:   . Medical and social history . Use of alcohol, tobacco or illicit drugs  . Current medications and supplements . Functional ability and status . Nutritional status . Physical activity . Advanced directives . List of other physicians . Hospitalizations, surgeries, and ER visits in previous 12 months . Vitals . Screenings to include cognitive, depression, and falls . Referrals and  appointments  In addition, I have reviewed and discussed with patient certain preventive protocols, quality metrics, and best practice recommendations. A written personalized care plan for preventive services as well as general preventive health recommendations were provided to patient.     Valina Maes [place], [place]   0/05/2329   Nurse Notes: Pap smear due at next in office apt. Pt declined receiving a flu shot this season.

## 2020-10-25 ENCOUNTER — Ambulatory Visit (INDEPENDENT_AMBULATORY_CARE_PROVIDER_SITE_OTHER): Payer: Self-pay

## 2020-10-25 ENCOUNTER — Other Ambulatory Visit: Payer: Self-pay

## 2020-10-25 DIAGNOSIS — Z0000 Encounter for general adult medical examination without abnormal findings: Secondary | ICD-10-CM

## 2020-10-25 DIAGNOSIS — Z1231 Encounter for screening mammogram for malignant neoplasm of breast: Secondary | ICD-10-CM

## 2020-10-25 NOTE — Patient Instructions (Signed)
Krystal Simpson , Thank you for taking time to come for your Medicare Wellness Visit. I appreciate your ongoing commitment to your health goals. Please review the following plan we discussed and let me know if I can assist you in the future.   Screening recommendations/referrals: Colonoscopy: Up to date, due 07/2023 Mammogram: Currently due, ordered placed today. Please call [phone] to schedule your mammogram.  Recommended yearly ophthalmology/optometry visit for glaucoma screening and checkup Recommended yearly dental visit for hygiene and checkup  Vaccinations: Influenza vaccine: Currently due, declined receiving this season.  Tdap vaccine: Up to date, due 09/2026 Shingles vaccine: Shingrix discussed. Please contact your pharmacy for coverage information.     Advanced directives: Advance directive discussed with you today. Even though you declined this today please call our office should you change your mind and we can give you the proper paperwork for you to fill out.  Conditions/risks identified: Pt to start walking for 3-4 days a week for at least 30 minutes at a time.   Next appointment: 11/02/20 @ 10:00 AM with Fenton Malling. Declined scheduling an AWV for 2023 at this time.   Preventive Care 40-64 Years, Female Preventive care refers to lifestyle choices and visits with your health care provider that can promote health and wellness. What does preventive care include?  A yearly physical exam. This is also called an annual well check.  Dental exams once or twice a year.  Routine eye exams. Ask your health care provider how often you should have your eyes checked.  Personal lifestyle choices, including:  Daily care of your teeth and gums.  Regular physical activity.  Eating a healthy diet.  Avoiding tobacco and drug use.  Limiting alcohol use.  Practicing safe sex.  Taking low-dose aspirin daily starting at age 62.  Taking vitamin and mineral supplements as  recommended by your health care provider. What happens during an annual well check? The services and screenings done by your health care provider during your annual well check will depend on your age, overall health, lifestyle risk factors, and family history of disease. Counseling  Your health care provider may ask you questions about your:  Alcohol use.  Tobacco use.  Drug use.  Emotional well-being.  Home and relationship well-being.  Sexual activity.  Eating habits.  Work and work Statistician.  Method of birth control.  Menstrual cycle.  Pregnancy history. Screening  You may have the following tests or measurements:  Height, weight, and BMI.  Blood pressure.  Lipid and cholesterol levels. These may be checked every 5 years, or more frequently if you are over 59 years old.  Skin check.  Lung cancer screening. You may have this screening every year starting at age 10 if you have a 30-pack-year history of smoking and currently smoke or have quit within the past 15 years.  Fecal occult blood test (FOBT) of the stool. You may have this test every year starting at age 5.  Flexible sigmoidoscopy or colonoscopy. You may have a sigmoidoscopy every 5 years or a colonoscopy every 10 years starting at age 23.  Hepatitis C blood test.  Hepatitis B blood test.  Sexually transmitted disease (STD) testing.  Diabetes screening. This is done by checking your blood sugar (glucose) after you have not eaten for a while (fasting). You may have this done every 1-3 years.  Mammogram. This may be done every 1-2 years. Talk to your health care provider about when you should start having regular mammograms. This may depend  on whether you have a family history of breast cancer.  BRCA-related cancer screening. This may be done if you have a family history of breast, ovarian, tubal, or peritoneal cancers.  Pelvic exam and Pap test. This may be done every 3 years starting at age 23.  Starting at age 76, this may be done every 5 years if you have a Pap test in combination with an HPV test.  Bone density scan. This is done to screen for osteoporosis. You may have this scan if you are at high risk for osteoporosis. Discuss your test results, treatment options, and if necessary, the need for more tests with your health care provider. Vaccines  Your health care provider may recommend certain vaccines, such as:  Influenza vaccine. This is recommended every year.  Tetanus, diphtheria, and acellular pertussis (Tdap, Td) vaccine. You may need a Td booster every 10 years.  Zoster vaccine. You may need this after age 79.  Pneumococcal 13-valent conjugate (PCV13) vaccine. You may need this if you have certain conditions and were not previously vaccinated.  Pneumococcal polysaccharide (PPSV23) vaccine. You may need one or two doses if you smoke cigarettes or if you have certain conditions. Talk to your health care provider about which screenings and vaccines you need and how often you need them. This information is not intended to replace advice given to you by your health care provider. Make sure you discuss any questions you have with your health care provider. Document Released: 10/06/2015 Document Revised: 05/29/2016 Document Reviewed: 07/11/2015 Elsevier Interactive Patient Education  2017 [employer] Prevention in the Home Falls can cause injuries. They can happen to people of all ages. There are many things you can do to make your home safe and to help prevent falls. What can I do on the outside of my home?  Regularly fix the edges of walkways and driveways and fix any cracks.  Remove anything that might make you trip as you walk through a door, such as a raised step or threshold.  Trim any bushes or trees on the path to your home.  Use bright outdoor lighting.  Clear any walking paths of anything that might make someone trip, such as rocks or  tools.  Regularly check to see if handrails are loose or broken. Make sure that both sides of any steps have handrails.  Any raised decks and porches should have guardrails on the edges.  Have any leaves, snow, or ice cleared regularly.  Use sand or salt on walking paths during winter.  Clean up any spills in your garage right away. This includes oil or grease spills. What can I do in the bathroom?  Use night lights.  Install grab bars by the toilet and in the tub and shower. Do not use towel bars as grab bars.  Use non-skid mats or decals in the tub or shower.  If you need to sit down in the shower, use a plastic, non-slip stool.  Keep the floor dry. Clean up any water that spills on the floor as soon as it happens.  Remove soap buildup in the tub or shower regularly.  Attach bath mats securely with double-sided non-slip rug tape.  Do not have throw rugs and other things on the floor that can make you trip. What can I do in the bedroom?  Use night lights.  Make sure that you have a light by your bed that is easy to reach.  Do not use any  sheets or blankets that are too big for your bed. They should not hang down onto the floor.  Have a firm chair that has side arms. You can use this for support while you get dressed.  Do not have throw rugs and other things on the floor that can make you trip. What can I do in the kitchen?  Clean up any spills right away.  Avoid walking on wet floors.  Keep items that you use a lot in easy-to-reach places.  If you need to reach something above you, use a strong step stool that has a grab bar.  Keep electrical cords out of the way.  Do not use floor polish or wax that makes floors slippery. If you must use wax, use non-skid floor wax.  Do not have throw rugs and other things on the floor that can make you trip. What can I do with my stairs?  Do not leave any items on the stairs.  Make sure that there are handrails on both  sides of the stairs and use them. Fix handrails that are broken or loose. Make sure that handrails are as long as the stairways.  Check any carpeting to make sure that it is firmly attached to the stairs. Fix any carpet that is loose or worn.  Avoid having throw rugs at the top or bottom of the stairs. If you do have throw rugs, attach them to the floor with carpet tape.  Make sure that you have a light switch at the top of the stairs and the bottom of the stairs. If you do not have them, ask someone to add them for you. What else can I do to help prevent falls?  Wear shoes that:  Do not have high heels.  Have rubber bottoms.  Are comfortable and fit you well.  Are closed at the toe. Do not wear sandals.  If you use a stepladder:  Make sure that it is fully opened. Do not climb a closed stepladder.  Make sure that both sides of the stepladder are locked into place.  Ask someone to hold it for you, if possible.  Clearly mark and make sure that you can see:  Any grab bars or handrails.  First and last steps.  Where the edge of each step is.  Use tools that help you move around (mobility aids) if they are needed. These include:  Canes.  Walkers.  Scooters.  Crutches.  Turn on the lights when you go into a dark area. Replace any light bulbs as soon as they burn out.  Set up your furniture so you have a clear path. Avoid moving your furniture around.  If any of your floors are uneven, fix them.  If there are any pets around you, be aware of where they are.  Review your medicines with your doctor. Some medicines can make you feel dizzy. This can increase your chance of falling. Ask your doctor what other things that you can do to help prevent falls. This information is not intended to replace advice given to you by your health care provider. Make sure you discuss any questions you have with your health care provider. Document Released: 07/06/2009 Document Revised:  02/15/2016 Document Reviewed: 10/14/2014 Elsevier Interactive Patient Education  2017 [employer].

## 2020-11-02 ENCOUNTER — Encounter: Payer: Self-pay | Admitting: Physician Assistant

## 2020-11-28 ENCOUNTER — Other Ambulatory Visit: Payer: Self-pay | Admitting: Physician Assistant

## 2020-11-28 DIAGNOSIS — M7661 Achilles tendinitis, right leg: Secondary | ICD-10-CM

## 2020-11-28 DIAGNOSIS — M533 Sacrococcygeal disorders, not elsewhere classified: Secondary | ICD-10-CM

## 2020-11-28 DIAGNOSIS — M545 Low back pain, unspecified: Secondary | ICD-10-CM

## 2020-11-28 NOTE — Telephone Encounter (Signed)
Pt's husband came in the office for refill request on the following medications:  HYDROcodone-acetaminophen (Webb) 10-325 MG tablet  [facility]  Last Rx: 08/14/20 LOV: 11/15/2019 Please advise. Thanks TNP

## 2020-11-29 MED ORDER — HYDROCODONE-ACETAMINOPHEN 10-325 MG PO TABS: 1.0000 | ORAL_TABLET | ORAL | 0 refills | Status: DC | PRN

## 2021-01-30 ENCOUNTER — Other Ambulatory Visit: Payer: Self-pay | Admitting: Physician Assistant

## 2021-01-30 DIAGNOSIS — M7661 Achilles tendinitis, right leg: Secondary | ICD-10-CM

## 2021-01-30 DIAGNOSIS — M533 Sacrococcygeal disorders, not elsewhere classified: Secondary | ICD-10-CM

## 2021-01-30 DIAGNOSIS — M545 Low back pain, unspecified: Secondary | ICD-10-CM

## 2021-01-30 NOTE — Telephone Encounter (Signed)
This is not a chronic medication and she has not been seen in our office in more than 1 year.  Will not refill at this time.  She can have office visit to reassess if she would like.

## 2021-02-28 DIAGNOSIS — F4312 Post-traumatic stress disorder, chronic: Secondary | ICD-10-CM | POA: Diagnosis not present

## 2021-02-28 DIAGNOSIS — F411 Generalized anxiety disorder: Secondary | ICD-10-CM | POA: Diagnosis not present

## 2021-02-28 DIAGNOSIS — F5081 Binge eating disorder: Secondary | ICD-10-CM | POA: Diagnosis not present

## 2021-02-28 DIAGNOSIS — R69 Illness, unspecified: Secondary | ICD-10-CM | POA: Diagnosis not present

## 2021-03-08 DIAGNOSIS — D2272 Melanocytic nevi of left lower limb, including hip: Secondary | ICD-10-CM | POA: Diagnosis not present

## 2021-03-08 DIAGNOSIS — M71371 Other bursal cyst, right ankle and foot: Secondary | ICD-10-CM | POA: Diagnosis not present

## 2021-03-08 DIAGNOSIS — D2262 Melanocytic nevi of left upper limb, including shoulder: Secondary | ICD-10-CM | POA: Diagnosis not present

## 2021-03-08 DIAGNOSIS — B360 Pityriasis versicolor: Secondary | ICD-10-CM | POA: Diagnosis not present

## 2021-03-08 DIAGNOSIS — C44712 Basal cell carcinoma of skin of right lower limb, including hip: Secondary | ICD-10-CM | POA: Diagnosis not present

## 2021-03-08 DIAGNOSIS — L304 Erythema intertrigo: Secondary | ICD-10-CM | POA: Diagnosis not present

## 2021-03-08 DIAGNOSIS — D2261 Melanocytic nevi of right upper limb, including shoulder: Secondary | ICD-10-CM | POA: Diagnosis not present

## 2021-03-08 DIAGNOSIS — M7130 Other bursal cyst, unspecified site: Secondary | ICD-10-CM | POA: Diagnosis not present

## 2021-03-08 DIAGNOSIS — D485 Neoplasm of uncertain behavior of skin: Secondary | ICD-10-CM | POA: Diagnosis not present

## 2021-03-08 DIAGNOSIS — D2271 Melanocytic nevi of right lower limb, including hip: Secondary | ICD-10-CM | POA: Diagnosis not present

## 2021-03-08 DIAGNOSIS — D225 Melanocytic nevi of trunk: Secondary | ICD-10-CM | POA: Diagnosis not present

## 2021-03-15 DIAGNOSIS — C44712 Basal cell carcinoma of skin of right lower limb, including hip: Secondary | ICD-10-CM | POA: Diagnosis not present

## 2021-05-04 ENCOUNTER — Ambulatory Visit: Payer: Self-pay | Admitting: Family Medicine

## 2021-05-30 DIAGNOSIS — F5081 Binge eating disorder: Secondary | ICD-10-CM | POA: Diagnosis not present

## 2021-05-30 DIAGNOSIS — R69 Illness, unspecified: Secondary | ICD-10-CM | POA: Diagnosis not present

## 2021-05-30 DIAGNOSIS — F4312 Post-traumatic stress disorder, chronic: Secondary | ICD-10-CM | POA: Diagnosis not present

## 2021-05-30 DIAGNOSIS — F330 Major depressive disorder, recurrent, mild: Secondary | ICD-10-CM | POA: Diagnosis not present

## 2021-05-30 DIAGNOSIS — F411 Generalized anxiety disorder: Secondary | ICD-10-CM | POA: Diagnosis not present

## 2021-06-19 ENCOUNTER — Encounter: Payer: Self-pay | Admitting: Family Medicine

## 2021-06-19 ENCOUNTER — Ambulatory Visit (INDEPENDENT_AMBULATORY_CARE_PROVIDER_SITE_OTHER): Payer: Medicare HMO | Admitting: Family Medicine

## 2021-06-19 ENCOUNTER — Other Ambulatory Visit: Payer: Self-pay

## 2021-06-19 VITALS — BP 104/71 | HR 99 | Temp 98.0°F | Resp 16 | Ht 64.5 in | Wt 180.0 lb

## 2021-06-19 DIAGNOSIS — M5030 Other cervical disc degeneration, unspecified cervical region: Secondary | ICD-10-CM

## 2021-06-19 DIAGNOSIS — Z23 Encounter for immunization: Secondary | ICD-10-CM

## 2021-06-19 DIAGNOSIS — G479 Sleep disorder, unspecified: Secondary | ICD-10-CM | POA: Diagnosis not present

## 2021-06-19 DIAGNOSIS — G2581 Restless legs syndrome: Secondary | ICD-10-CM | POA: Diagnosis not present

## 2021-06-19 DIAGNOSIS — F3341 Major depressive disorder, recurrent, in partial remission: Secondary | ICD-10-CM

## 2021-06-19 DIAGNOSIS — M5412 Radiculopathy, cervical region: Secondary | ICD-10-CM | POA: Insufficient documentation

## 2021-06-19 DIAGNOSIS — M545 Low back pain, unspecified: Secondary | ICD-10-CM | POA: Diagnosis not present

## 2021-06-19 DIAGNOSIS — G8929 Other chronic pain: Secondary | ICD-10-CM

## 2021-06-19 DIAGNOSIS — R632 Polyphagia: Secondary | ICD-10-CM | POA: Insufficient documentation

## 2021-06-19 DIAGNOSIS — R69 Illness, unspecified: Secondary | ICD-10-CM | POA: Diagnosis not present

## 2021-06-19 DIAGNOSIS — F5104 Psychophysiologic insomnia: Secondary | ICD-10-CM

## 2021-06-19 DIAGNOSIS — F411 Generalized anxiety disorder: Secondary | ICD-10-CM

## 2021-06-19 MED ORDER — ROPINIROLE HCL 0.25 MG PO TABS: 0.2500 mg | ORAL_TABLET | Freq: Every day | ORAL | 2 refills | Status: DC

## 2021-06-19 MED ORDER — VENLAFAXINE HCL ER 150 MG PO CP24: 150.0000 mg | ORAL_CAPSULE | Freq: Every day | ORAL | 0 refills | Status: DC

## 2021-06-19 MED ORDER — SERTRALINE HCL 100 MG PO TABS: 200.0000 mg | ORAL_TABLET | Freq: Every day | ORAL | 0 refills | Status: DC

## 2021-06-19 MED ORDER — LISDEXAMFETAMINE DIMESYLATE 50 MG PO CAPS: 50.0000 mg | ORAL_CAPSULE | Freq: Every day | ORAL | 0 refills | Status: DC

## 2021-06-19 MED ORDER — TRAZODONE HCL 50 MG PO TABS: 50.0000 mg | ORAL_TABLET | Freq: Every evening | ORAL | 0 refills | Status: DC | PRN

## 2021-06-19 NOTE — Progress Notes (Signed)
Established patient visit   Patient: Krystal Simpson   DOB: August 30, 1961   60 y.o. Female  MRN: [id number] Visit Date: 06/19/2021  Today's healthcare provider: Lavon Paganini, MD   Chief Complaint  Patient presents with   Depression   Back Pain    Subjective    HPI  Depression, Follow-up  She  was last seen for this 1 years ago. Changes made at last visit include no changes.   She reports excellent compliance with treatment. She is not having side effects.   She reports excellent tolerance of treatment. Current symptoms include: fatigue and insomnia She feels she is Improved since last visit.  She is no longer seeing Dr Nicolasa Ducking for psych - was told that we could manage her medications. She is not interested in seeing a therapist at this time (saw one previously for 4 yrs)  Taking Zoloft 100 and Effexor 150 for depression and trazodone for sleep  Was also managed for binge eating with Vyvanse. Tried Topamax for migraines in the past and didn't help binge eating.  Per conversation with Dr Michele Mcalpine ago: I have seen this patient since 2015. She has more recently been having problems feeling overwhelmed, can't cope with stress. She is binge eating a lot. She refuses to go to therapy. She [place] asking for more Vyvanse but is already on a high dose. I told her no bc she [place] not doing anything therapy wise.  Depression screen [facility] 2/9 06/19/2021 10/25/2020 10/21/2019  Decreased Interest 1 0 0  Down, Depressed, Hopeless 0 0 0  PHQ - 2 Score 1 0 0  Altered sleeping 1 - -  Tired, decreased energy 2 - -  Change in appetite 2 - -  Feeling bad or failure about yourself  1 - -  Trouble concentrating 0 - -  Moving slowly or fidgety/restless 0 - -  Suicidal thoughts 0 - -  PHQ-9 Score 7 - -  Difficult doing work/chores Not difficult at all - -    ----------------------------------------------------------------------------------------- Back Pain  She reports chronic back pain.  There was not an injury that may have caused the pain. The most recent episode started  several years ago and is staying constant. The pain is located in the waist with radiation. It is described as aching and burning, is moderate in intensity, occurring intermittently. Symptoms are worse in the: evening  Aggravating factors: standing and walking Relieving factors: medication tylenol and ibuprofen and standing.  She has tried application of heat, NSAIDs, and topical anesthetics with mild relief.   Weight loss helped in the past, but she did get worse sciatica. Saw Dr Sharlet Salina in  the past and tried ESI (failed). Tried gabapentin in the past.  Associated symptoms: No abdominal pain No bowel incontinence  No chest pain No dysuria   No fever No headaches  No joint pains No pelvic pain  No weakness in leg  No tingling in lower extremities  No urinary incontinence No weight loss    -----------------------------------------------------------------------------------------     Medications: Outpatient Medications Prior to Visit  Medication Sig   Cholecalciferol 1000 UNITS capsule Take 1,000 Units by mouth daily.    Erenumab-aooe (AIMOVIG) 140 MG/ML SOAJ Inject into the skin every 30 (thirty) days.   Flaxseed, Linseed, (FLAX SEEDS PO) Take 1 capsule by mouth daily.   HYDROcodone-acetaminophen (NORCO) 10-325 MG tablet Take 1 tablet by mouth every 4 (four) hours as needed.   ketoconazole (NIZORAL) 2 % shampoo PLEASE  SEE ATTACHED FOR DETAILED DIRECTIONS   MULTIPLE VITAMINS-MINERALS PO Take 1 tablet by mouth daily.    Omega-3 Fatty Acids (FISH OIL) 1000 MG CAPS Take 1 capsule by mouth daily.    OVER THE COUNTER MEDICATION Numbing cream for neck pain as needed   pantoprazole (PROTONIX) 40 MG tablet Take 40 mg by mouth daily.   Polyethylene Glycol 3350 (MIRALAX PO) Take 17 g by mouth daily.    SUMAtriptan (IMITREX) 100 MG tablet TAKE 1 AT HEADACHE ONSET, MAY REPEAT AFTER 2 HOURS, NO MORE THAN 2 IN A  DAY,NO MORE THAN 4 IN A WEEK   [DISCONTINUED] DULoxetine (CYMBALTA) 30 MG capsule Take 90 mg by mouth daily.   [DISCONTINUED] lisdexamfetamine (VYVANSE) 50 MG capsule Take 1 capsule (50 mg total) by mouth daily.   [DISCONTINUED] sertraline (ZOLOFT) 100 MG tablet Take 100 mg by mouth 2 (two) times daily.    [DISCONTINUED] traZODone (DESYREL) 50 MG tablet Take 100 mg by mouth at bedtime as needed for sleep.   [DISCONTINUED] venlafaxine XR (EFFEXOR-XR) 150 MG 24 hr capsule Take 150 mg by mouth daily.   [DISCONTINUED] venlafaxine XR (EFFEXOR-XR) 75 MG 24 hr capsule Take 150 mg by mouth daily with breakfast.   [DISCONTINUED] atorvastatin (LIPITOR) 10 MG tablet Take 1 tablet (10 mg total) by mouth daily.   [DISCONTINUED] azelastine (ASTELIN) 0.1 % nasal spray Place 2 sprays into both nostrils 2 (two) times daily. Use in each nostril as directed   [DISCONTINUED] baclofen (LIORESAL) 10 MG tablet Take 1 tablet (10 mg total) by mouth 3 (three) times daily.   [DISCONTINUED] clonazePAM (KLONOPIN) 0.25 MG disintegrating tablet Take 0.25 mg by mouth daily.    [DISCONTINUED] cyanocobalamin 1000 MCG tablet Take 1,000 mcg by mouth daily.    [DISCONTINUED] diclofenac (VOLTAREN) 75 MG EC tablet Take 75 mg by mouth 2 (two) times daily.   [DISCONTINUED] famotidine (PEPCID) 20 MG tablet Take 1 tablet (20 mg total) by mouth 2 (two) times daily.   [DISCONTINUED] Fremanezumab-vfrm 225 MG/1.5ML SOSY Inject into the skin. (Patient not taking: No sig reported)   [DISCONTINUED] gabapentin (NEURONTIN) 300 MG capsule Take 1 capsule (300 mg total) by mouth 2 (two) times daily.   [DISCONTINUED] lamoTRIgine (LAMICTAL) 200 MG tablet Take 200 mg by mouth daily.   [DISCONTINUED] meloxicam (MOBIC) 15 MG tablet Take 15 mg by mouth daily.   [DISCONTINUED] meloxicam (MOBIC) 7.5 MG tablet Take 1 tablet (7.5 mg total) by mouth daily.   [DISCONTINUED] methylPREDNISolone (MEDROL) 4 MG TBPK tablet 6 day taper; take as directed on package  instructions   [DISCONTINUED] naloxone (NARCAN) nasal spray 4 mg/0.1 mL Place 1 spray into the nose. If needed   [DISCONTINUED] nystatin cream (MYCOSTATIN) Apply 1 application topically 2 (two) times daily.   [DISCONTINUED] promethazine (PHENERGAN) 25 MG tablet Take 1 tablet (25 mg total) by mouth every 8 (eight) hours as needed for nausea or vomiting.   [DISCONTINUED] terbinafine (LAMISIL) 250 MG tablet Take 250 mg by mouth daily.   [DISCONTINUED] Tetrahydrozoline HCl (EYE DROPS OP) Apply 1 drop to eye 2 (two) times daily.   [DISCONTINUED] topiramate (TOPAMAX) 100 MG tablet Take 100 mg by mouth daily.   [DISCONTINUED] topiramate (TOPAMAX) 25 MG tablet Take 20-100 mg by mouth 2 (two) times daily. Take 25 mg by mouth in the morning and 100 mg by mouth at bedtime.   [DISCONTINUED] topiramate (TOPAMAX) 50 MG tablet 50 mg 2 (two) times daily. 100 mg in am and 50 mg in pm   [  DISCONTINUED] traMADol (ULTRAM) 50 MG tablet Take by mouth at bedtime as needed.   No facility-administered medications prior to visit.    Review of Systems  Constitutional:  Positive for appetite change and fatigue.  Respiratory:  Negative for chest tightness and shortness of breath.   Cardiovascular:  Negative for chest pain and palpitations.  Musculoskeletal:  Positive for back pain, myalgias and neck pain.  Psychiatric/Behavioral:  Positive for behavioral problems and sleep disturbance.    Last CBC Lab Results  Component Value Date   WBC 16.4 (H) 12/02/2019   HGB 13.3 12/02/2019   HCT 40.9 12/02/2019   MCV 90.1 12/02/2019   MCH 29.3 12/02/2019   RDW 13.2 12/02/2019   PLT 263 28/36/6294   Last metabolic panel Lab Results  Component Value Date   GLUCOSE 109 (H) 12/02/2019   NA 139 12/02/2019   K 3.5 12/02/2019   CL 109 12/02/2019   CO2 22 12/02/2019   BUN 19 12/02/2019   CREATININE 0.88 12/02/2019   GFRNONAA >60 12/02/2019   CALCIUM 8.8 (L) 12/02/2019   PROT 7.2 12/02/2019   ALBUMIN 4.3 12/02/2019    LABGLOB 1.6 10/28/2018   AGRATIO 3.1 (H) 10/28/2018   BILITOT 0.6 12/02/2019   ALKPHOS 96 12/02/2019   AST 15 12/02/2019   ALT 10 12/02/2019   ANIONGAP 8 12/02/2019   Last lipids Lab Results  Component Value Date   CHOL 198 10/28/2018   HDL 65 10/28/2018   LDLCALC 119 (H) 10/28/2018   TRIG 71 10/28/2018   CHOLHDL 3.0 10/28/2018   Last hemoglobin A1c Lab Results  Component Value Date   HGBA1C 4.7 (L) 10/28/2018   Last thyroid functions Lab Results  Component Value Date   TSH 0.628 10/28/2018   T4TOTAL 7.5 12/15/2017   Last vitamin D Lab Results  Component Value Date   VD25OH 39.9 10/09/2017   Last vitamin B12 and Folate Lab Results  Component Value Date   VITAMINB12 1,253 (H) 10/09/2017       Objective    BP 104/71 (BP Location: Left Arm, Patient Position: Sitting, Cuff Size: Large)   Pulse 99   Temp 98 F (36.7 C) (Oral)   Resp 16   Ht 5' 4.5" (1.638 m)   Wt 180 lb (81.6 kg)   BMI 30.42 kg/m  BP Readings from Last 3 Encounters:  06/19/21 104/71  12/02/19 (!) 124/96  11/15/19 129/83   Wt Readings from Last 3 Encounters:  06/19/21 180 lb (81.6 kg)  12/02/19 181 lb (82.1 kg)  11/15/19 181 lb 12.8 oz (82.5 kg)      Physical Exam Vitals reviewed.  Constitutional:      General: She is not in acute distress.    Appearance: Normal appearance. She is well-developed. She is not diaphoretic.  HENT:     Head: Normocephalic and atraumatic.  Eyes:     General: No scleral icterus.    Conjunctiva/sclera: Conjunctivae normal.  Neck:     Thyroid: No thyromegaly.  Cardiovascular:     Rate and Rhythm: Normal rate and regular rhythm.     Pulses: Normal pulses.     Heart sounds: Normal heart sounds. No murmur heard. Pulmonary:     Effort: Pulmonary effort is normal. No respiratory distress.     Breath sounds: Normal breath sounds. No wheezing, rhonchi or rales.  Musculoskeletal:     Cervical back: Neck supple.     Right lower leg: No edema.     Left lower  leg: No  edema.  Lymphadenopathy:     Cervical: No cervical adenopathy.  Skin:    General: Skin is warm and dry.     Findings: No rash.  Neurological:     Mental Status: She is alert and oriented to person, place, and time. Mental status is at baseline.     Sensory: No sensory deficit.     Motor: No weakness.  Psychiatric:        Mood and Affect: Mood normal.        Behavior: Behavior normal.      No results found for any visits on 06/19/21.  Assessment & Plan     Problem List Items Addressed This Visit       Nervous and Auditory   Cervical radiculopathy    As below, will send back to PM&R for ongoing management      Relevant Medications   venlafaxine XR (EFFEXOR-XR) 150 MG 24 hr capsule   traZODone (DESYREL) 50 MG tablet   sertraline (ZOLOFT) 100 MG tablet   lisdexamfetamine (VYVANSE) 50 MG capsule   rOPINIRole (REQUIP) 0.25 MG tablet   Other Relevant Orders   Ambulatory referral to Physical Medicine Rehab     Other   Clinical depression    Chronic and fairly well controlled Previously followed by Dr. Nicolasa Ducking with psychiatry, but no longer seeing her Not currently seeing a therapist Continue Effexor and Zoloft at current dose Monitor for any signs of serotonin syndrome while taking both these and trazodone No longer on Cymbalta      Relevant Medications   venlafaxine XR (EFFEXOR-XR) 150 MG 24 hr capsule   traZODone (DESYREL) 50 MG tablet   sertraline (ZOLOFT) 100 MG tablet   Disordered sleep    Continue trazodone at current dose      Low back pain - Primary    Longstanding problem with known arthritis No extra narcotics today Continue ice/heat, NSAIDs/Tylenol Referral back to Dr. Glenice Laine nurse as she was doing well under his care previously      Relevant Orders   Ambulatory referral to Physical Medicine Rehab   GAD (generalized anxiety disorder)    Chronic and fairly well controlled Not currently seeing a psychiatrist or therapist Continue medications at  current dose Monitor for any signs of serotonin syndrome as she is taking Zoloft, Effexor, trazodone      Relevant Medications   venlafaxine XR (EFFEXOR-XR) 150 MG 24 hr capsule   traZODone (DESYREL) 50 MG tablet   sertraline (ZOLOFT) 100 MG tablet   RLS (restless legs syndrome)    Longstanding, but new diagnosis Never treated for this, but she did take gabapentin for other reasons in the past She did not tolerate this well due to the drowsiness We will try low-dose Requip At follow-up, consider dose titration      Binge eating    Continue Vyvanse at current dose Consider nutrition therapy in the future Consider healthy weight and wellness clinic Discussed mindful eating      Other Visit Diagnoses     DDD (degenerative disc disease), cervical       Relevant Orders   Ambulatory referral to Physical Medicine Rehab   Need for influenza vaccination       Relevant Orders   Flu Vaccine QUAD 48mo+IM (Fluarix, Fluzone & Alfiuria Quad PF) (Completed)   Psychophysiological insomnia            Return in about 3 months (around 09/18/2021) for chronic disease f/u, With new PCP.  Total time spent on today's visit was greater than 40 minutes, including both face-to-face time and nonface-to-face time personally spent on review of chart (labs and imaging), discussing labs and goals, discussing further work-up, treatment options, referrals to specialist if needed, reviewing outside records of pertinent, answering patient's questions, and coordinating care.   I, Lavon Paganini, MD, have reviewed all documentation for this visit. The documentation on 06/19/21 for the exam, diagnosis, procedures, and orders are all accurate and complete.   Nilani Hugill, Dionne Bucy, MD, MPH [facility]

## 2021-06-19 NOTE — Assessment & Plan Note (Signed)
Chronic and fairly well controlled Not currently seeing a psychiatrist or therapist Continue medications at current dose Monitor for any signs of serotonin syndrome as she is taking Zoloft, Effexor, trazodone

## 2021-06-19 NOTE — Assessment & Plan Note (Signed)
Chronic and fairly well controlled Previously followed by Dr. Nicolasa Ducking with psychiatry, but no longer seeing her Not currently seeing a therapist Continue Effexor and Zoloft at current dose Monitor for any signs of serotonin syndrome while taking both these and trazodone No longer on Cymbalta

## 2021-06-19 NOTE — Assessment & Plan Note (Signed)
Continue trazodone at current dose. 

## 2021-06-19 NOTE — Assessment & Plan Note (Signed)
Longstanding, but new diagnosis Never treated for this, but she did take gabapentin for other reasons in the past She did not tolerate this well due to the drowsiness We will try low-dose Requip At follow-up, consider dose titration

## 2021-06-19 NOTE — Assessment & Plan Note (Signed)
As below, will send back to PM&R for ongoing management

## 2021-06-19 NOTE — Assessment & Plan Note (Signed)
Longstanding problem with known arthritis No extra narcotics today Continue ice/heat, NSAIDs/Tylenol Referral back to Dr. Glenice Laine nurse as she was doing well under his care previously

## 2021-06-19 NOTE — Assessment & Plan Note (Signed)
Continue Vyvanse at current dose Consider nutrition therapy in the future Consider healthy weight and wellness clinic Discussed mindful eating

## 2021-07-03 DIAGNOSIS — G2581 Restless legs syndrome: Secondary | ICD-10-CM | POA: Diagnosis not present

## 2021-07-03 DIAGNOSIS — G479 Sleep disorder, unspecified: Secondary | ICD-10-CM | POA: Diagnosis not present

## 2021-07-03 DIAGNOSIS — R200 Anesthesia of skin: Secondary | ICD-10-CM | POA: Diagnosis not present

## 2021-07-03 DIAGNOSIS — R519 Headache, unspecified: Secondary | ICD-10-CM | POA: Diagnosis not present

## 2021-07-03 DIAGNOSIS — R202 Paresthesia of skin: Secondary | ICD-10-CM | POA: Diagnosis not present

## 2021-07-06 DIAGNOSIS — M62838 Other muscle spasm: Secondary | ICD-10-CM | POA: Diagnosis not present

## 2021-07-06 DIAGNOSIS — M5030 Other cervical disc degeneration, unspecified cervical region: Secondary | ICD-10-CM | POA: Diagnosis not present

## 2021-07-06 DIAGNOSIS — M5489 Other dorsalgia: Secondary | ICD-10-CM | POA: Diagnosis not present

## 2021-07-06 DIAGNOSIS — M5442 Lumbago with sciatica, left side: Secondary | ICD-10-CM | POA: Diagnosis not present

## 2021-07-06 DIAGNOSIS — M5136 Other intervertebral disc degeneration, lumbar region: Secondary | ICD-10-CM | POA: Diagnosis not present

## 2021-07-06 DIAGNOSIS — M542 Cervicalgia: Secondary | ICD-10-CM | POA: Diagnosis not present

## 2021-07-06 DIAGNOSIS — M62830 Muscle spasm of back: Secondary | ICD-10-CM | POA: Diagnosis not present

## 2021-07-06 DIAGNOSIS — M5134 Other intervertebral disc degeneration, thoracic region: Secondary | ICD-10-CM | POA: Diagnosis not present

## 2021-07-06 DIAGNOSIS — G8929 Other chronic pain: Secondary | ICD-10-CM | POA: Diagnosis not present

## 2021-07-06 DIAGNOSIS — M5416 Radiculopathy, lumbar region: Secondary | ICD-10-CM | POA: Diagnosis not present

## 2021-08-01 ENCOUNTER — Telehealth: Payer: Self-pay | Admitting: Family Medicine

## 2021-08-01 MED ORDER — LISDEXAMFETAMINE DIMESYLATE 50 MG PO CAPS: 50.0000 mg | ORAL_CAPSULE | Freq: Every day | ORAL | 0 refills | Status: DC

## 2021-08-01 NOTE — Telephone Encounter (Signed)
LOV: 06/19/2021  NOV: 09/18/2021  Last Refill: 06/19/2021 #30 0 Refills.   Thanks,   -Mickel Baas

## 2021-08-01 NOTE — Telephone Encounter (Signed)
Patient needs to change pharmacy to where her Rx needs refill sent to: [facility]  Address: [address] [place], [place] [address]  Phone: [phone]

## 2021-08-01 NOTE — Telephone Encounter (Signed)
Please review message below. Patient wants prescription sent to a different pharmacy.

## 2021-08-01 NOTE — Telephone Encounter (Signed)
Medication Refill - Medication:  lisdexamfetamine (VYVANSE) 50 MG capsule   Has the patient contacted their pharmacy? Yes.   Contact PCP  Preferred Pharmacy (with phone number or street name):  [id number] [address]  Phone:  [phone] Fax:  [phone]  Has the patient been seen for an appointment in the last year OR does the patient have an upcoming appointment? Yes.    Agent: Please be advised that RX refills may take up to 3 business days. We ask that you follow-up with your pharmacy.

## 2021-08-01 NOTE — Telephone Encounter (Signed)
Requested medication (s) are due for refill today - yes  Requested medication (s) are on the active medication list -yes  Future visit scheduled -yes  Last refill: 06/19/21  Notes to clinic: Request RF: non delegated Rx  Requested Prescriptions  Pending Prescriptions Disp Refills   lisdexamfetamine (VYVANSE) 50 MG capsule 30 capsule 0    Sig: Take 1 capsule (50 mg total) by mouth daily.     Not Delegated - Psychiatry:  Stimulants/ADHD Failed - 08/01/2021  9:10 AM      Failed - This refill cannot be delegated      Failed - Urine Drug Screen completed in last 360 days      Passed - Valid encounter within last 3 months    Recent Outpatient Visits           1 month ago Chronic right-sided low back pain without sciatica   [facility] [place], Dionne Bucy, MD   1 year ago Fall, initial encounter   [facility], Clearnce Sorrel, [place]   2 years ago [facility], Clearnce Sorrel, [place]   2 years ago Annual physical exam   [facility] [place], [place]   3 years ago Fall, initial encounter   [place], PA-C       Future Appointments             In 1 month Gwyneth Sprout, [place], [facility]               Requested Prescriptions  Pending Prescriptions Disp Refills   lisdexamfetamine (VYVANSE) 50 MG capsule 30 capsule 0    Sig: Take 1 capsule (50 mg total) by mouth daily.     Not Delegated - Psychiatry:  Stimulants/ADHD Failed - 08/01/2021  9:10 AM      Failed - This refill cannot be delegated      Failed - Urine Drug Screen completed in last 360 days      Passed - Valid encounter within last 3 months    Recent Outpatient Visits           1 month ago Chronic right-sided low back pain without sciatica   [facility] [place], Dionne Bucy, MD   1 year ago Fall, initial encounter   [facility], Clearnce Sorrel, [place]   2 years ago [facility], Clearnce Sorrel, [place]   2 years ago Annual physical exam   [facility] [place], [place]   3 years ago Fall, initial encounter   [facility], Clearnce Sorrel, PA-C       Future Appointments             In 1 month Rollene Rotunda, Jaci Standard, [place], [place]

## 2021-08-02 NOTE — Telephone Encounter (Signed)
Patient asking if medicine can be sent to her at [facility] in [place] instead, while she is there Krystal Simpson   Address: [address] [place],  [address]   Phone: [phone]

## 2021-08-02 NOTE — Telephone Encounter (Signed)
Pts husband calling and is requesting that he be called instead of pt once this has been completed. Please advise.     Krystal [phone]

## 2021-08-03 ENCOUNTER — Other Ambulatory Visit: Payer: Self-pay | Admitting: Family Medicine

## 2021-08-03 MED ORDER — LISDEXAMFETAMINE DIMESYLATE 50 MG PO CAPS: 50.0000 mg | ORAL_CAPSULE | Freq: Every day | ORAL | 0 refills | Status: DC

## 2021-08-03 NOTE — Telephone Encounter (Signed)
Notified husband that prescription was sent in. KW

## 2021-08-13 MED ORDER — SUMATRIPTAN SUCCINATE 100 MG PO TABS: 100.0000 mg | ORAL_TABLET | Freq: Once | ORAL | 1 refills | Status: DC

## 2021-08-13 MED ORDER — AIMOVIG 140 MG/ML ~~LOC~~ SOAJ: 140.0000 mg | SUBCUTANEOUS | 1 refills | Status: DC

## 2021-08-30 ENCOUNTER — Other Ambulatory Visit: Payer: Self-pay | Admitting: Family Medicine

## 2021-08-31 DIAGNOSIS — M5412 Radiculopathy, cervical region: Secondary | ICD-10-CM | POA: Diagnosis not present

## 2021-08-31 DIAGNOSIS — M4802 Spinal stenosis, cervical region: Secondary | ICD-10-CM | POA: Diagnosis not present

## 2021-08-31 DIAGNOSIS — M5030 Other cervical disc degeneration, unspecified cervical region: Secondary | ICD-10-CM | POA: Diagnosis not present

## 2021-09-01 ENCOUNTER — Other Ambulatory Visit: Payer: Self-pay | Admitting: Family Medicine

## 2021-09-03 ENCOUNTER — Other Ambulatory Visit: Payer: Self-pay | Admitting: Family Medicine

## 2021-09-03 ENCOUNTER — Encounter: Payer: Self-pay | Admitting: Family Medicine

## 2021-09-03 MED ORDER — LISDEXAMFETAMINE DIMESYLATE 50 MG PO CAPS: 50.0000 mg | ORAL_CAPSULE | Freq: Every day | ORAL | 0 refills | Status: DC

## 2021-09-10 ENCOUNTER — Other Ambulatory Visit: Payer: Self-pay | Admitting: Family Medicine

## 2021-09-17 ENCOUNTER — Other Ambulatory Visit: Payer: Self-pay | Admitting: Family Medicine

## 2021-09-18 ENCOUNTER — Ambulatory Visit: Payer: Medicare HMO | Admitting: Family Medicine

## 2021-09-18 NOTE — Progress Notes (Deleted)
Established patient visit   Patient: Krystal Simpson   DOB: 20-Jan-1961   60 y.o. Female  MRN: [id number] Visit Date: 09/18/2021  Today's healthcare provider: Gwyneth Sprout, FNP   No chief complaint on file.  Subjective    HPI  Depression, Follow-up  She  was last seen for this 3 months ago. Changes made at last visit include none.   She reports {excellent/good/fair/poor:19665} compliance with treatment. She {is/is not:21021397} having side effects. ***  She reports {DESC; GOOD/FAIR/POOR:18685} tolerance of treatment. Current symptoms include: {Symptoms; depression:1002} She feels she is {improved/worse/unchanged:3041574} since last visit.  Depression screen [facility] 2/9 06/19/2021 10/25/2020 10/21/2019  Decreased Interest 1 0 0  Down, Depressed, Hopeless 0 0 0  PHQ - 2 Score 1 0 0  Altered sleeping 1 - -  Tired, decreased energy 2 - -  Change in appetite 2 - -  Feeling bad or failure about yourself  1 - -  Trouble concentrating 0 - -  Moving slowly or fidgety/restless 0 - -  Suicidal thoughts 0 - -  PHQ-9 Score 7 - -  Difficult doing work/chores Not difficult at all - -    -----------------------------------------------------------------------------------------  Anxiety, Follow-up  She was last seen for anxiety 3 months ago. Changes made at last visit include none.   She reports {excellent/good/fair/poor:19665} compliance with treatment. She reports {good/fair/poor:18685} tolerance of treatment. She {is/is not:21021397} having side effects. {document side effects if present:1}  She feels her anxiety is {Desc; severity:60313} and {improved/worse/unchanged:3041574} since last visit.  Symptoms: {Yes/No:20286} chest pain {Yes/No:20286} difficulty concentrating  {Yes/No:20286} dizziness {Yes/No:20286} fatigue  {Yes/No:20286} feelings of losing control {Yes/No:20286} insomnia  {Yes/No:20286} irritable {Yes/No:20286} palpitations  {Yes/No:20286} panic attacks  {Yes/No:20286} racing thoughts  {Yes/No:20286} shortness of breath {Yes/No:20286} sweating  {Yes/No:20286} tremors/shakes    GAD-7 Results No flowsheet data found.  PHQ-9 Scores PHQ9 SCORE ONLY 06/19/2021 10/25/2020 10/21/2019  PHQ-9 Total Score 7 0 0    ---------------------------------------------------------------------------------------------------  Follow up for RLS  The patient was last seen for this 3 months ago. Changes made at last visit include started patient on low dose Requip.  She reports {excellent/good/fair/poor:19665} compliance with treatment. She feels that condition is {improved/worse/unchanged:3041574}. She {is/is not:21021397} having side effects. ***  -----------------------------------------------------------------------------------------  Follow up for Binge Eating  The patient was last seen for this 3 months ago. Changes made at last visit include continue Vyvanse.  She reports {excellent/good/fair/poor:19665} compliance with treatment. She feels that condition is {improved/worse/unchanged:3041574}. She {is/is not:21021397} having side effects. ***  -----------------------------------------------------------------------------------------  Medications: Outpatient Medications Prior to Visit  Medication Sig   AIMOVIG 140 MG/ML SOAJ INJECT 140 MG INTO THE SKIN EVERY 30 DAYS   Cholecalciferol 1000 UNITS capsule Take 1,000 Units by mouth daily.    Flaxseed, Linseed, (FLAX SEEDS PO) Take 1 capsule by mouth daily.   HYDROcodone-acetaminophen (NORCO) 10-325 MG tablet Take 1 tablet by mouth every 4 (four) hours as needed.   ketoconazole (NIZORAL) 2 % shampoo PLEASE SEE ATTACHED FOR DETAILED DIRECTIONS   lisdexamfetamine (VYVANSE) 50 MG capsule Take 1 capsule (50 mg total) by mouth daily.   MULTIPLE VITAMINS-MINERALS PO Take 1 tablet by mouth daily.    Omega-3 Fatty Acids (FISH OIL) 1000 MG CAPS Take 1 capsule by mouth daily.    OVER THE COUNTER MEDICATION  Numbing cream for neck pain as needed   pantoprazole (PROTONIX) 40 MG tablet Take 40 mg by mouth daily.   Polyethylene Glycol 3350 (MIRALAX PO) Take 17 g by mouth  daily.    rOPINIRole (REQUIP) 0.25 MG tablet Take 1 tablet (0.25 mg total) by mouth at bedtime.   sertraline (ZOLOFT) 100 MG tablet Take 2 tablets (200 mg total) by mouth daily.   SUMAtriptan (IMITREX) 100 MG tablet Take 1 tablet (100 mg total) by mouth once for 1 dose. TAKE 1 AT HEADACHE ONSET, MAY REPEAT AFTER 2 HOURS, NO MORE THAN 2 IN A DAY,NO MORE THAN 4 IN A WEEK   traZODone (DESYREL) 50 MG tablet Take 1-2 tablets (50-100 mg total) by mouth at bedtime as needed for sleep.   venlafaxine XR (EFFEXOR-XR) 150 MG 24 hr capsule Take 1 capsule (150 mg total) by mouth daily.   No facility-administered medications prior to visit.    Review of Systems  {Labs   Heme   Chem   Endocrine   Serology   Results Review (optional):23779}   Objective    There were no vitals taken for this visit. {Show previous vital signs (optional):23777}  Physical Exam  ***  No results found for any visits on 09/18/21.  Assessment & Plan     ***  No follow-ups on file.      {provider attestation***:1}   Gwyneth Sprout, [place] [phone] (phone) [phone] (fax)  [place]

## 2021-09-23 ENCOUNTER — Other Ambulatory Visit: Payer: Self-pay | Admitting: Family Medicine

## 2021-09-24 ENCOUNTER — Other Ambulatory Visit: Payer: Self-pay | Admitting: Family Medicine

## 2021-10-06 ENCOUNTER — Other Ambulatory Visit: Payer: Self-pay | Admitting: Family Medicine

## 2021-10-09 DIAGNOSIS — H2513 Age-related nuclear cataract, bilateral: Secondary | ICD-10-CM | POA: Diagnosis not present

## 2021-10-09 MED ORDER — LISDEXAMFETAMINE DIMESYLATE 50 MG PO CAPS: 50.0000 mg | ORAL_CAPSULE | Freq: Every day | ORAL | 0 refills | Status: DC

## 2021-10-10 DIAGNOSIS — M4802 Spinal stenosis, cervical region: Secondary | ICD-10-CM | POA: Diagnosis not present

## 2021-10-10 DIAGNOSIS — M5412 Radiculopathy, cervical region: Secondary | ICD-10-CM | POA: Diagnosis not present

## 2021-10-11 ENCOUNTER — Other Ambulatory Visit: Payer: Self-pay | Admitting: Family Medicine

## 2021-10-11 NOTE — Telephone Encounter (Signed)
LOV:  06/19/2021  NOV: 10/24/2021

## 2021-10-11 NOTE — Telephone Encounter (Signed)
Requested medications are due for refill today.  yes  Requested medications are on the active medications list.  yes  Last refill. 07/13/2021  Future visit scheduled.   yes  Notes to clinic.  Rx expired 08/13/2021.    Requested Prescriptions  Pending Prescriptions Disp Refills   SUMAtriptan (IMITREX) 100 MG tablet [Pharmacy Med Name: SUMATRIPTAN SUCC 100 MG TABLET] 9 tablet 2    Sig: TAKE 1 TAB BY MOUTH ONCE AT HEADACHE ONSET, MAY REPEAT AFTER 2 HRS, MAX 2 TABS/DAY, 4 TABS/WEEK     Neurology:  Migraine Therapy - Triptan Passed - 10/11/2021  3:18 AM      Passed - Last BP in normal range    BP Readings from Last 1 Encounters:  06/19/21 104/71          Passed - Valid encounter within last 12 months    Recent Outpatient Visits           3 months ago Chronic right-sided low back pain without sciatica   [facility], Dionne Bucy, MD   1 year ago Fall, initial encounter   [facility], Clearnce Sorrel, [place]   2 years ago [facility] Clearnce Sor[place], [place]   2 years ago Annual physical exam   [facility], Clearnce Sorrel, [place]   3 years ago Fall, initial encounter   [facility], Clearn[place], PA-C       Future Appointments             In 1 week Gwyneth Sprout, [place], [place]

## 2021-10-24 ENCOUNTER — Ambulatory Visit: Payer: Medicare HMO | Admitting: Family Medicine

## 2021-11-08 ENCOUNTER — Other Ambulatory Visit: Payer: Self-pay | Admitting: Family Medicine

## 2021-11-08 MED ORDER — LISDEXAMFETAMINE DIMESYLATE 50 MG PO CAPS: 50.0000 mg | ORAL_CAPSULE | Freq: Every day | ORAL | 0 refills | Status: DC

## 2021-11-12 ENCOUNTER — Other Ambulatory Visit: Payer: Self-pay | Admitting: Family Medicine

## 2021-12-10 ENCOUNTER — Other Ambulatory Visit: Payer: Self-pay | Admitting: Family Medicine

## 2021-12-11 ENCOUNTER — Other Ambulatory Visit: Payer: Self-pay | Admitting: Family Medicine

## 2021-12-11 MED ORDER — ROPINIROLE HCL 0.25 MG PO TABS: 0.2500 mg | ORAL_TABLET | Freq: Every day | ORAL | 0 refills | Status: DC

## 2021-12-11 MED ORDER — LISDEXAMFETAMINE DIMESYLATE 50 MG PO CAPS: 50.0000 mg | ORAL_CAPSULE | Freq: Every day | ORAL | 0 refills | Status: DC

## 2021-12-20 ENCOUNTER — Other Ambulatory Visit: Payer: Self-pay | Admitting: Family Medicine

## 2021-12-20 NOTE — Telephone Encounter (Signed)
LOV:06/19/2021 ?[id number] ?No Show 09/18/2021 and 10/24/2021 ?

## 2022-01-01 ENCOUNTER — Ambulatory Visit (INDEPENDENT_AMBULATORY_CARE_PROVIDER_SITE_OTHER): Payer: Medicare HMO

## 2022-01-01 VITALS — Wt 180.0 lb

## 2022-01-01 DIAGNOSIS — Z1231 Encounter for screening mammogram for malignant neoplasm of breast: Secondary | ICD-10-CM

## 2022-01-01 DIAGNOSIS — Z0000 Encounter for general adult medical examination without abnormal findings: Secondary | ICD-10-CM

## 2022-01-01 NOTE — Patient Instructions (Addendum)
Krystal Simpson , ?Thank you for taking time to come for your Medicare Wellness Visit. I appreciate your ongoing commitment to your health goals. Please review the following plan we discussed and let me know if I can assist you in the future.  ? ?Screening recommendations/referrals: ?Colonoscopy: 08/16/13 ?Mammogram: referral sent ?Bone Density: too young ?Recommended yearly ophthalmology/optometry visit for glaucoma screening and checkup ?Recommended yearly dental visit for hygiene and checkup ? ?Vaccinations: ?Influenza vaccine: 06/19/21 ?Pneumococcal vaccine: n/d ?Tdap vaccine: allergic ?Shingles vaccine: Shingrix 06/08/19  ?Covid-19: 02/01/20, 03/02/20, 09/21/20 ? ?Advanced directives: no ? ?Conditions/risks identified: none ? ?Next appointment: Follow up in one year for your annual wellness visit. - 01/06/23 @ 1pm by phone ? ?Preventive Care 40-64 Years, Female ?Preventive care refers to lifestyle choices and visits with your health care provider that can promote health and wellness. ?What does preventive care include? ?A yearly physical exam. This is also called an annual well check. ?Dental exams once or twice a year. ?Routine eye exams. Ask your health care provider how often you should have your eyes checked. ?Personal lifestyle choices, including: ?Daily care of your teeth and gums. ?Regular physical activity. ?Eating a healthy diet. ?Avoiding tobacco and drug use. ?Limiting alcohol use. ?Practicing safe sex. ?Taking low-dose aspirin daily starting at age 54. ?Taking vitamin and mineral supplements as recommended by your health care provider. ?What happens during an annual well check? ?The services and screenings done by your health care provider during your annual well check will depend on your age, overall health, lifestyle risk factors, and family history of disease. ?Counseling  ?Your health care provider may ask you questions about your: ?Alcohol use. ?Tobacco use. ?Drug use. ?Emotional well-being. ?Home and  relationship well-being. ?Sexual activity. ?Eating habits. ?Work and work Statistician. ?Method of birth control. ?Menstrual cycle. ?Pregnancy history. ?Screening  ?You may have the following tests or measurements: ?Height, weight, and BMI. ?Blood pressure. ?Lipid and cholesterol levels. These may be checked every 5 years, or more frequently if you are over 78 years old. ?Skin check. ?Lung cancer screening. You may have this screening every year starting at age 43 if you have a 30-pack-year history of smoking and currently smoke or have quit within the past 15 years. ?Fecal occult blood test (FOBT) of the stool. You may have this test every year starting at age 36. ?Flexible sigmoidoscopy or colonoscopy. You may have a sigmoidoscopy every 5 years or a colonoscopy every 10 years starting at age 36. ?Hepatitis C blood test. ?Hepatitis B blood test. ?Sexually transmitted disease (STD) testing. ?Diabetes screening. This is done by checking your blood sugar (glucose) after you have not eaten for a while (fasting). You may have this done every 1-3 years. ?Mammogram. This may be done every 1-2 years. Talk to your health care provider about when you should start having regular mammograms. This may depend on whether you have a family history of breast cancer. ?BRCA-related cancer screening. This may be done if you have a family history of breast, ovarian, tubal, or peritoneal cancers. ?Pelvic exam and Pap test. This may be done every 3 years starting at age 35. Starting at age 62, this may be done every 5 years if you have a Pap test in combination with an HPV test. ?Bone density scan. This is done to screen for osteoporosis. You may have this scan if you are at high risk for osteoporosis. ?Discuss your test results, treatment options, and if necessary, the need for more tests with your health  care provider. ?Vaccines  ?Your health care provider may recommend certain vaccines, such as: ?Influenza vaccine. This is recommended  every year. ?Tetanus, diphtheria, and acellular pertussis (Tdap, Td) vaccine. You may need a Td booster every 10 years. ?Zoster vaccine. You may need this after age 61. ?Pneumococcal 13-valent conjugate (PCV13) vaccine. You may need this if you have certain conditions and were not previously vaccinated. ?Pneumococcal polysaccharide (PPSV23) vaccine. You may need one or two doses if you smoke cigarettes or if you have certain conditions. ?Talk to your health care provider about which screenings and vaccines you need and how often you need them. ?This information is not intended to replace advice given to you by your health care provider. Make sure you discuss any questions you have with your health care provider. ?Document Released: 10/06/2015 Document Revised: 05/29/2016 Document Reviewed: 07/11/2015 ?Elsevier Interactive Patient Education ? 2017 [employer] ? ? ? ?Fall Prevention in the Home ?Falls can cause injuries. They can happen to people of all ages. There are many things you can do to make your home safe and to help prevent falls. ?What can I do on the outside of my home? ?Regularly fix the edges of walkways and driveways and fix any cracks. ?Remove anything that might make you trip as you walk through a door, such as a raised step or threshold. ?Trim any bushes or trees on the path to your home. ?Use bright outdoor lighting. ?Clear any walking paths of anything that might make someone trip, such as rocks or tools. ?Regularly check to see if handrails are loose or broken. Make sure that both sides of any steps have handrails. ?Any raised decks and porches should have guardrails on the edges. ?Have any leaves, snow, or ice cleared regularly. ?Use sand or salt on walking paths during winter. ?Clean up any spills in your garage right away. This includes oil or grease spills. ?What can I do in the bathroom? ?Use night lights. ?Install grab bars by the toilet and in the tub and shower. Do not use towel bars as  grab bars. ?Use non-skid mats or decals in the tub or shower. ?If you need to sit down in the shower, use a plastic, non-slip stool. ?Keep the floor dry. Clean up any water that spills on the floor as soon as it happens. ?Remove soap buildup in the tub or shower regularly. ?Attach bath mats securely with double-sided non-slip rug tape. ?Do not have throw rugs and other things on the floor that can make you trip. ?What can I do in the bedroom? ?Use night lights. ?Make sure that you have a light by your bed that is easy to reach. ?Do not use any sheets or blankets that are too big for your bed. They should not hang down onto the floor. ?Have a firm chair that has side arms. You can use this for support while you get dressed. ?Do not have throw rugs and other things on the floor that can make you trip. ?What can I do in the kitchen? ?Clean up any spills right away. ?Avoid walking on wet floors. ?Keep items that you use a lot in easy-to-reach places. ?If you need to reach something above you, use a strong step stool that has a grab bar. ?Keep electrical cords out of the way. ?Do not use floor polish or wax that makes floors slippery. If you must use wax, use non-skid floor wax. ?Do not have throw rugs and other things on the floor that can  make you trip. ?What can I do with my stairs? ?Do not leave any items on the stairs. ?Make sure that there are handrails on both sides of the stairs and use them. Fix handrails that are broken or loose. Make sure that handrails are as long as the stairways. ?Check any carpeting to make sure that it is firmly attached to the stairs. Fix any carpet that is loose or worn. ?Avoid having throw rugs at the top or bottom of the stairs. If you do have throw rugs, attach them to the floor with carpet tape. ?Make sure that you have a light switch at the top of the stairs and the bottom of the stairs. If you do not have them, ask someone to add them for you. ?What else can I do to help prevent  falls? ?Wear shoes that: ?Do not have high heels. ?Have rubber bottoms. ?Are comfortable and fit you well. ?Are closed at the toe. Do not wear sandals. ?If you use a stepladder: ?Make sure that it is ful

## 2022-01-01 NOTE — Progress Notes (Signed)
?Virtual Visit via Telephone Note ? ?I connected with  Krystal Simpson on 01/01/22 at  1:45 PM EDT by telephone and verified that I am speaking with the correct person using two identifiers. ? ?Location: ?Patient: home ?Provider: [facility] ?Persons participating in the virtual visit: patient/Nurse Health Advisor ?  ?I discussed the limitations, risks, security and privacy concerns of performing an evaluation and management service by telephone and the availability of in person appointments. The patient expressed understanding and agreed to proceed. ? ?Interactive audio and video telecommunications were attempted between this nurse and patient, however failed, due to patient having technical difficulties OR patient did not have access to video capability.  We continued and completed visit with audio only. ? ?Some vital signs may be absent or patient reported.  ? ?Dionisio David, LPN ? ?Subjective:  ? Krystal Simpson is a 61 y.o. female who presents for Medicare Annual (Subsequent) preventive examination. ? ?Review of Systems    ? ?  ? ?   ?Objective:  ?  ?There were no vitals filed for this visit. ?There is no height or weight on file to calculate BMI. ? ? ?  10/25/2020  ?  2:41 PM 12/02/2019  ?  1:09 AM 10/21/2019  ? 10:55 AM 10/16/2018  ? 10:33 AM 10/16/2018  ? 10:16 AM 01/27/2018  ?  3:55 PM 10/06/2017  ?  2:30 PM  ?Advanced Directives  ?Does Patient Have a Medical Advance Directive? No No No No No No No  ?Does patient want to make changes to medical advance directive?    Yes (MAU/Ambulatory/Procedural Areas - Information given) Yes (MAU/Ambulatory/Procedural Areas - Information given)    ?Would patient like information on creating a medical advance directive? No - Patient declined  No - Patient declined  No - Patient declined No - Patient declined Yes (MAU/Ambulatory/Procedural Areas - Information given)  ? ? ?Current Medications (verified) ?Outpatient Encounter Medications as of 01/01/2022  ?Medication Sig  ? Erenumab-aooe  (AIMOVIG) 140 MG/ML SOAJ Inject into the skin.  ? Erenumab-aooe 140 MG/ML SOAJ Inject into the skin.  ? gabapentin (NEURONTIN) 100 MG capsule 2 po qHS  ? HYDROcodone-acetaminophen (NORCO) 10-325 MG tablet Take by mouth.  ? AIMOVIG 140 MG/ML SOAJ INJECT 140 MG INTO THE SKIN EVERY 30 DAYS  ? Cholecalciferol 1000 UNITS capsule Take 1,000 Units by mouth daily.   ? cyclobenzaprine (FLEXERIL) 10 MG tablet Take 10 mg by mouth at bedtime.  ? Flaxseed, Linseed, (FLAX SEEDS PO) Take 1 capsule by mouth daily.  ? gabapentin (NEURONTIN) 100 MG capsule Take 200 mg by mouth at bedtime.  ? HYDROcodone-acetaminophen (NORCO) 10-325 MG tablet Take 1 tablet by mouth every 4 (four) hours as needed.  ? ketoconazole (NIZORAL) 2 % shampoo PLEASE SEE ATTACHED FOR DETAILED DIRECTIONS  ? lisdexamfetamine (VYVANSE) 50 MG capsule Take 1 capsule (50 mg total) by mouth daily. Due for office follow up. Please call to schedule appointment.  ? Multiple Vitamin (MULTI-VITAMIN) tablet Take 1 tablet by mouth daily.  ? MULTIPLE VITAMINS-MINERALS PO Take 1 tablet by mouth daily.   ? Omega-3 Fatty Acids (FISH OIL) 1000 MG CAPS Take 1 capsule by mouth daily.   ? OVER THE COUNTER MEDICATION Numbing cream for neck pain as needed  ? pantoprazole (PROTONIX) 40 MG tablet Take 40 mg by mouth daily.  ? Polyethylene Glycol 3350 (MIRALAX PO) Take 17 g by mouth daily.   ? rOPINIRole (REQUIP) 0.25 MG tablet Take 1 tablet (0.25 mg total) by mouth  at bedtime. Due for office visit for follow up.  ? rOPINIRole (REQUIP) 0.25 MG tablet Take by mouth.  ? sertraline (ZOLOFT) 100 MG tablet TAKE 2 TABLETS BY MOUTH EVERY DAY  ? SUMAtriptan (IMITREX) 100 MG tablet TAKE 1 TAB BY MOUTH ONCE AT HEADACHE ONSET, MAY REPEAT AFTER 2 HRS, MAX 2 TABS/DAY, 4 TABS/WEEK  ? traZODone (DESYREL) 50 MG tablet TAKE 1 TO 2 TABLETS BY MOUTH AT BEDTIME AS NEEDED FOR SLEEP  ? venlafaxine XR (EFFEXOR-XR) 150 MG 24 hr capsule TAKE 1 CAPSULE BY MOUTH EVERY DAY  ? ?No facility-administered encounter  medications on file as of 01/01/2022.  ? ? ?Allergies (verified) ?Cipro [ciprofloxacin-ciproflox hcl er], Ciprofloxacin, Tetanus toxoids, and Tetanus toxoids  ? ?History: ?Past Medical History:  ?Diagnosis Date  ? Abnormal Pap smear of cervix   ? Anxiety   ? Cancer [facility])   ? 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 Depression   ? History of kidney stones   ? ?Past Surgical History:  ?Procedure Laterality Date  ? BASAL CELL CARCINOMA EXCISION  2014  ? BASAL CELL CARCINOMA EXCISION  09/20/2015  ? Dr. Evorn Gong, left mid forearm  ? MOHS SURGERY  2015  ? top of head Dr. Shelda Jakes at [facility]  ? SQUAMOUS CELL CARCINOMA EXCISION  2014  ? ?Family History  ?Problem Relation Age of Onset  ? Emphysema Mother   ? Lung cancer Father   ? Healthy Sister   ? Heart attack Sister 71  ? Heart attack Maternal Grandmother   ? Breast cancer Sister   ? ?Social History  ? ?Socioeconomic History  ? Marital status: Married  ?  Spouse name: Abbe Amsterdam  ? Number of children: 2  ? Years of education: some college  ? Highest education level: Some college, no degree  ?Occupational History  ? Occupation: disabled  ?Tobacco Use  ? Smoking status: Never  ? Smokeless tobacco: Never  ?Vaping Use  ? Vaping Use: Never used  ?Substance and Sexual Activity  ? Alcohol use: No  ?  Alcohol/week: 0.0 standard drinks  ? Drug use: No  ? Sexual activity: Not Currently  ?Other Topics Concern  ? Not on file  ?Social History Narrative  ? 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   ? ?Social Determinants of Health  ? ?Financial Resource Strain: Not on file  ?Food Insecurity: Not on file  ?Transportation Needs: Not on file  ?Physical Activity: Not on file  ?Stress: Not on file  ?Social Connections: Not on file  ? ? ?Tobacco Counseling ?Counseling given: Not Answered ? ? ?Clinical Intake: ? ?Pre-visit preparation completed: Yes ? ?Pain : No/denies pain ? ?  ? ?Nutritional Risks: None ?Diabetes: No ? ?How often do you need to have someone help you when you read  instructions, pamphlets, or other written materials from your doctor or pharmacy?: 1 - Never ? ?Diabetic?no ? ?Interpreter Needed?: No ? ?Information entered by :: Kirke Shaggy, LPN ? ? ?Activities of Daily Living ? ?  06/19/2021  ?  8:58 AM  ?In your present state of health, do you have any difficulty performing the following activities:  ?Hearing? 0  ?Vision? 0  ?Difficulty concentrating or making decisions? 0  ?Walking or climbing stairs? 1  ?Dressing or bathing? 0  ?Doing errands, shopping? 0  ? ? ?Patient Care Team: ?Gwyneth Sprout, FNP as PCP - General (Family Medicine) ?Arelia Sneddon, OD as Consulting Physician (Optometry) ?Merril Abbe, MD as Referring Physician (  Dermatology) ?Oneta Rack, MD as Consulting Physician (Dermatology) ?Chauncey Mann, MD as Referring Physician (Psychiatry) ?Sharlet Salina, MD as Referring Physician (Physical Medicine and Rehabilitation) ?Meeler, Sherren Kerns, FNP (Family Medicine) ?Anabel Bene, MD as Referring Physician (Neurology) ?Jannifer Franklin, NP as Nurse Practitioner (Neurology) ?Carloyn Manner, MD as Referring Physician (Otolaryngology) ? ?Indicate any recent Medical Services you may have received from other than [facility] providers in the past year (date may be approximate). ? ?   ?Assessment:  ? This is a routine wellness examination for [place]. ? ?Hearing/Vision screen ?No results found. ? ?Dietary issues and exercise activities discussed: ?  ? ? Goals Addressed   ?None ?  ? ?Depression Screen ? ?  06/19/2021  ?  8:58 AM 10/25/2020  ?  2:37 PM 10/21/2019  ? 10:55 AM 10/21/2019  ? 10:52 AM 10/16/2018  ? 10:17 AM 10/06/2017  ?  2:30 PM 12/26/2016  ?  2:40 PM  ?PHQ 2/9 Scores  ?PHQ - 2 Score 1 0 0 0 3 6 0  ?PHQ- 9 Score '7    14 18   '$ ?Exception Documentation       Other- indicate reason in comment box  ?  ?Fall Risk ? ?  06/19/2021  ?  8:58 AM 10/25/2020  ?  2:43 PM 10/21/2019  ? 10:55 AM 10/16/2018  ? 10:17 AM 06/01/2018  ?  4:05 PM  ?Fall Risk   ?Falls in the past  year? 0 0 1 1 Yes  ?Number falls in past yr: 0 0 '1 1 2 '$ or more  ?Injury with Fall? 0 0 0 1 Yes  ?Comment     Lower back  ?Risk for fall due to : No Fall Risks      ?Follow up Falls evaluation completed  Falls

## 2022-01-02 DIAGNOSIS — M5416 Radiculopathy, lumbar region: Secondary | ICD-10-CM | POA: Diagnosis not present

## 2022-01-02 DIAGNOSIS — G8929 Other chronic pain: Secondary | ICD-10-CM | POA: Diagnosis not present

## 2022-01-02 DIAGNOSIS — M549 Dorsalgia, unspecified: Secondary | ICD-10-CM | POA: Diagnosis not present

## 2022-01-02 DIAGNOSIS — M62830 Muscle spasm of back: Secondary | ICD-10-CM | POA: Diagnosis not present

## 2022-01-02 DIAGNOSIS — M5442 Lumbago with sciatica, left side: Secondary | ICD-10-CM | POA: Diagnosis not present

## 2022-01-02 DIAGNOSIS — M5136 Other intervertebral disc degeneration, lumbar region: Secondary | ICD-10-CM | POA: Diagnosis not present

## 2022-01-03 ENCOUNTER — Other Ambulatory Visit: Payer: Self-pay | Admitting: Family Medicine

## 2022-01-03 DIAGNOSIS — M5416 Radiculopathy, lumbar region: Secondary | ICD-10-CM

## 2022-01-09 ENCOUNTER — Other Ambulatory Visit: Payer: Self-pay | Admitting: Family Medicine

## 2022-01-09 ENCOUNTER — Ambulatory Visit
Admission: RE | Admit: 2022-01-09 | Discharge: 2022-01-09 | Disposition: A | Discharge status: Home / Self Care | Payer: Medicare HMO | Source: Ambulatory Visit | Attending: Family Medicine | Admitting: Family Medicine

## 2022-01-09 DIAGNOSIS — M545 Low back pain, unspecified: Secondary | ICD-10-CM | POA: Diagnosis not present

## 2022-01-09 DIAGNOSIS — M5416 Radiculopathy, lumbar region: Secondary | ICD-10-CM

## 2022-01-11 MED ORDER — LISDEXAMFETAMINE DIMESYLATE 50 MG PO CAPS: 50.0000 mg | ORAL_CAPSULE | Freq: Every day | ORAL | 0 refills | Status: DC

## 2022-01-15 ENCOUNTER — Ambulatory Visit: Payer: Medicare HMO | Admitting: Family Medicine

## 2022-01-23 DIAGNOSIS — M5416 Radiculopathy, lumbar region: Secondary | ICD-10-CM | POA: Diagnosis not present

## 2022-01-23 DIAGNOSIS — M4802 Spinal stenosis, cervical region: Secondary | ICD-10-CM | POA: Diagnosis not present

## 2022-01-23 DIAGNOSIS — M542 Cervicalgia: Secondary | ICD-10-CM | POA: Diagnosis not present

## 2022-01-23 DIAGNOSIS — M5412 Radiculopathy, cervical region: Secondary | ICD-10-CM | POA: Diagnosis not present

## 2022-01-23 DIAGNOSIS — M5030 Other cervical disc degeneration, unspecified cervical region: Secondary | ICD-10-CM | POA: Diagnosis not present

## 2022-01-23 DIAGNOSIS — M5136 Other intervertebral disc degeneration, lumbar region: Secondary | ICD-10-CM | POA: Diagnosis not present

## 2022-01-24 ENCOUNTER — Encounter: Payer: Self-pay | Admitting: Family Medicine

## 2022-01-24 ENCOUNTER — Other Ambulatory Visit (HOSPITAL_COMMUNITY)
Admission: RE | Admit: 2022-01-24 | Discharge: 2022-01-24 | Disposition: A | Discharge status: Home / Self Care | Payer: Medicare HMO | Source: Ambulatory Visit | Attending: Family Medicine | Admitting: Family Medicine

## 2022-01-24 ENCOUNTER — Ambulatory Visit (INDEPENDENT_AMBULATORY_CARE_PROVIDER_SITE_OTHER): Payer: Medicare HMO | Admitting: Family Medicine

## 2022-01-24 ENCOUNTER — Other Ambulatory Visit: Payer: Self-pay | Admitting: Family Medicine

## 2022-01-24 VITALS — BP 122/82 | HR 103 | Temp 98.4°F | Resp 16 | Ht 64.0 in | Wt 176.3 lb

## 2022-01-24 DIAGNOSIS — R69 Illness, unspecified: Secondary | ICD-10-CM | POA: Diagnosis not present

## 2022-01-24 DIAGNOSIS — E7800 Pure hypercholesterolemia, unspecified: Secondary | ICD-10-CM

## 2022-01-24 DIAGNOSIS — F411 Generalized anxiety disorder: Secondary | ICD-10-CM

## 2022-01-24 DIAGNOSIS — G43109 Migraine with aura, not intractable, without status migrainosus: Secondary | ICD-10-CM

## 2022-01-24 DIAGNOSIS — G2581 Restless legs syndrome: Secondary | ICD-10-CM | POA: Diagnosis not present

## 2022-01-24 DIAGNOSIS — M792 Neuralgia and neuritis, unspecified: Secondary | ICD-10-CM | POA: Diagnosis not present

## 2022-01-24 DIAGNOSIS — M5442 Lumbago with sciatica, left side: Secondary | ICD-10-CM

## 2022-01-24 DIAGNOSIS — D559 Anemia due to enzyme disorder, unspecified: Secondary | ICD-10-CM | POA: Diagnosis not present

## 2022-01-24 DIAGNOSIS — E162 Hypoglycemia, unspecified: Secondary | ICD-10-CM | POA: Insufficient documentation

## 2022-01-24 DIAGNOSIS — E559 Vitamin D deficiency, unspecified: Secondary | ICD-10-CM | POA: Diagnosis not present

## 2022-01-24 DIAGNOSIS — M5441 Lumbago with sciatica, right side: Secondary | ICD-10-CM

## 2022-01-24 DIAGNOSIS — Z0000 Encounter for general adult medical examination without abnormal findings: Secondary | ICD-10-CM | POA: Insufficient documentation

## 2022-01-24 DIAGNOSIS — Z124 Encounter for screening for malignant neoplasm of cervix: Secondary | ICD-10-CM | POA: Insufficient documentation

## 2022-01-24 DIAGNOSIS — E569 Vitamin deficiency, unspecified: Secondary | ICD-10-CM | POA: Diagnosis not present

## 2022-01-24 DIAGNOSIS — D649 Anemia, unspecified: Secondary | ICD-10-CM

## 2022-01-24 DIAGNOSIS — F902 Attention-deficit hyperactivity disorder, combined type: Secondary | ICD-10-CM | POA: Insufficient documentation

## 2022-01-24 DIAGNOSIS — R7989 Other specified abnormal findings of blood chemistry: Secondary | ICD-10-CM

## 2022-01-24 DIAGNOSIS — Z1231 Encounter for screening mammogram for malignant neoplasm of breast: Secondary | ICD-10-CM | POA: Insufficient documentation

## 2022-01-24 DIAGNOSIS — B372 Candidiasis of skin and nail: Secondary | ICD-10-CM | POA: Insufficient documentation

## 2022-01-24 DIAGNOSIS — R5382 Chronic fatigue, unspecified: Secondary | ICD-10-CM | POA: Diagnosis not present

## 2022-01-24 DIAGNOSIS — G8929 Other chronic pain: Secondary | ICD-10-CM

## 2022-01-24 MED ORDER — NYSTATIN 100000 UNIT/GM EX CREA: 1.0000 "application " | TOPICAL_CREAM | Freq: Two times a day (BID) | CUTANEOUS | 11 refills | Status: DC

## 2022-01-24 MED ORDER — SEMAGLUTIDE-WEIGHT MANAGEMENT 0.25 MG/0.5ML ~~LOC~~ SOAJ: 0.2500 mg | SUBCUTANEOUS | 0 refills | Status: DC

## 2022-01-24 MED ORDER — LISDEXAMFETAMINE DIMESYLATE 50 MG PO CAPS: 50.0000 mg | ORAL_CAPSULE | Freq: Every day | ORAL | 0 refills | Status: DC

## 2022-01-24 MED ORDER — SUMATRIPTAN SUCCINATE 100 MG PO TABS: ORAL_TABLET | ORAL | 2 refills | Status: DC

## 2022-01-24 MED ORDER — ROPINIROLE HCL 0.25 MG PO TABS: 0.2500 mg | ORAL_TABLET | Freq: Every day | ORAL | 1 refills | Status: DC

## 2022-01-24 MED ORDER — TRAZODONE HCL 50 MG PO TABS: 50.0000 mg | ORAL_TABLET | Freq: Every evening | ORAL | 1 refills | Status: DC | PRN

## 2022-01-24 MED ORDER — SERTRALINE HCL 100 MG PO TABS: 200.0000 mg | ORAL_TABLET | Freq: Every day | ORAL | 1 refills | Status: DC

## 2022-01-24 NOTE — Assessment & Plan Note (Signed)
UTD on dental and eye ?Things to do to keep yourself healthy  ?- Exercise at least 30-45 minutes a day, 3-4 days a week.  ?- Eat a low-fat diet with lots of fruits and vegetables, up to 7-9 servings per day.  ?- Seatbelts can save your life. Wear them always.  ?- Smoke detectors on every level of your home, check batteries every year.  ?- Eye Doctor - have an eye exam every 1-2 years  ?- Safe sex - if you may be exposed to STDs, use a condom.  ?- Alcohol -  If you drink, do it moderately, less than 2 drinks per day.  ?- [place]. Choose someone to speak for you if you are not able.  ?- Depression is common in our stressful world.If you're feeling down or losing interest in things you normally enjoy, please come in for a visit.  ?- Violence - If anyone is threatening or hurting you, please call immediately. ? ? ?

## 2022-01-24 NOTE — Assessment & Plan Note (Signed)
Repeat A1c and CMP at this time ?Denies any feelings of low blood sugar; A1c previously low ?

## 2022-01-24 NOTE — Assessment & Plan Note (Signed)
Associated with fatigue; will repeat iron panel and CBC with diff. ?

## 2022-01-24 NOTE — Assessment & Plan Note (Signed)
Chronic, stable ?Continue vyvanse  ?

## 2022-01-24 NOTE — Assessment & Plan Note (Signed)
Chronic, stable ?Has been started on opioid by external provider ?Has not started taking at this time ?

## 2022-01-24 NOTE — Assessment & Plan Note (Signed)
Chronic, stable ?Denies ability to perform rehab at this time d/t family/social time constraints  ?

## 2022-01-24 NOTE — Assessment & Plan Note (Signed)
Chronic, unstable ?LDL and total cholesterol previously elevated ?Repeat FLP ?

## 2022-01-24 NOTE — Assessment & Plan Note (Signed)
Due for screening PAP smear for cervical cancer  ?Denies current symptoms  ?

## 2022-01-24 NOTE — Telephone Encounter (Signed)
Requested medication (s) are due for refill today:   prescribed today by Daneil Dan ? ?Requested medication (s) are on the active medication list:    ? ?Future visit scheduled:   Yes ? ? ?Last ordered: Today 5/4 ? ?Returned because pharmacy requesting an alternative or a PA.   ? ?Requested Prescriptions  ?Pending Prescriptions Disp Refills  ? WEGOVY 0.25 MG/0.5ML SOAJ [Pharmacy Med Name: WEGOVY 0.25 MG/0.5 ML PEN]  0  ?  Sig: INJECT 0.'25MG'$  INTO THE SKIN ONE TIME PER WEEK  ?  ? Endocrinology:  Diabetes - GLP-1 Receptor Agonists - semaglutide Failed - 01/24/2022 12:15 PM  ?  ?  Failed - HBA1C in normal range and within 180 days  ?  Hgb A1c MFr Bld  ?Date Value Ref Range Status  ?10/28/2018 4.7 (L) 4.8 - 5.6 % Final  ?  Comment:  ?           Prediabetes: 5.7 - 6.4 ?         Diabetes: >6.4 ?         Glycemic control for adults with diabetes: <7.0 ?  ?  ?  ?  ?  Failed - Cr in normal range and within 360 days  ?  Creatinine, Ser  ?Date Value Ref Range Status  ?12/02/2019 0.88 0.44 - 1.00 mg/dL Final  ?  ?  ?  ?  Passed - Valid encounter within last 6 months  ?  Recent Outpatient Visits   ? ?      ? Today Annual physical exam  ? [id number] Gwyneth Sprout, FNP  ? 7 months ago Chronic right-sided low back pain without sciatica  ? [facility], Dionne Bucy, MD  ? 2 years ago Fall, initial encounter  ? [place], [place]  ? 2 years ago Intertrigo  ? [facility] [place], Clearnce Sorrel, [place]  ? 3 years ago Annual physical exam  ? [facility] Fenton Malling M, [place]  ? ?  ?  ?Future Appointments   ? ?        ? In 6 months Gwyneth Sprout, [place], [place]  ? ?  ? ? ?  ?  ?  ? ?

## 2022-01-24 NOTE — Assessment & Plan Note (Signed)
Chronic, stable ?Due for medication refill ?

## 2022-01-24 NOTE — Assessment & Plan Note (Signed)
Chronic, stable ?Continue SSRI- Zoloft 100 mg x2 tablets, 200 mg total ?Encouraged therapy ?Contracted to safety ?

## 2022-01-24 NOTE — Assessment & Plan Note (Signed)
Chronic, stable ?Continues to take PRN medication as needed ?Has previously been seen by neurology  ?

## 2022-01-24 NOTE — Assessment & Plan Note (Signed)
Chronic, undiagnosed ?Will check CBC, Vitamin and Thyroid ?Likely exacerbated from anxiety  ?

## 2022-01-24 NOTE — Assessment & Plan Note (Signed)
Repeat TSH and free T4; patient reports weight gain and worsening anxiety  ?

## 2022-01-24 NOTE — Progress Notes (Signed)
? ?I,Tiffany J Bragg,acting as a scribe for Gwyneth Sprout, FNP.,have documented all relevant documentation on the behalf of Gwyneth Sprout, FNP,as directed by  Gwyneth Sprout, FNP while in the presence of Gwyneth Sprout, FNP.  ? ?Complete physical exam ? ? ?Patient: Krystal Simpson   DOB: 1960-12-22   61 y.o. Female  MRN: [id number] ?Visit Date: 01/24/2022 ? ?Today's healthcare provider: Gwyneth Sprout, FNP  ?Introduced to Designer, jewellery role and practice setting.  All questions answered.  Discussed provider/patient relationship and expectations. ? ? ?Chief Complaint  ?Patient presents with  ? Annual Exam  ? ?Subjective  ?  ?Krystal Simpson is a 61 y.o. female who presents today for a complete physical exam.  ?She reports consuming a  poor  diet. The patient does not participate in regular exercise at present. She generally feels fairly well. She reports sleeping poorly. She does have additional problems to discuss today patient would like to also discuss weight loss shots and cream for under breasts, and restless leg syndrome. ?HPI  ? ? ?Past Medical History:  ?Diagnosis Date  ? Abnormal Pap smear of cervix   ? Anxiety   ? Cancer [facility])   ? 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 Depression   ? History of kidney stones   ? ?Past Surgical History:  ?Procedure Laterality Date  ? BASAL CELL CARCINOMA EXCISION  2014  ? BASAL CELL CARCINOMA EXCISION  09/20/2015  ? Dr. Evorn Gong, left mid forearm  ? MOHS SURGERY  2015  ? top of head Dr. [facility]lda Jakes at [facility]  ? SQUAMOUS CELL CARCINOMA EXCISION  2014  ? ?Social History  ? ?Socioeconomic History  ? Marital status: Married  ?  Spouse name: Abbe Amsterdam  ? Number of children: 2  ? Years of education: some college  ? Highest education level: Some college, no degree  ?Occupational History  ? Occupation: disabled  ?Tobacco Use  ? Smoking status: Never  ? Smokeless tobacco: Never  ?Vaping Use  ? Vaping Use: Never used  ?Substance and Sexual Activity  ? Alcohol use: No  ?   Alcohol/week: 0.0 standard drinks  ? Drug use: No  ? Sexual activity: Not Currently  ?Other Topics Concern  ? Not on file  ?Social History Narrative  ? 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   ? ?Social Determinants of Health  ? ?Financial Resource Strain: Low Risk   ? Difficulty of Paying Living Expenses: Not hard at all  ?Food Insecurity: No Food Insecurity  ? Worried About Charity fundraiser in the Last Year: Never true  ? Ran Out of Food in the Last Year: Never true  ?Transportation Needs: No Transportation Needs  ? Lack of Transportation (Medical): No  ? Lack of Transportation (Non-Medical): No  ?Physical Activity: Not on file  ?Stress: No Stress Concern Present  ? Feeling of Stress : Only a little  ?Social Connections: Moderately Isolated  ? Frequency of Communication with Friends and Family: More than three times a week  ? Frequency of Social Gatherings with Friends and Family: Three times a week  ? Attends Religious Services: Never  ? Active Member of Clubs or Organizations: No  ? Attends Archivist Meetings: Never  ? Marital Status: Married  ?Intimate Partner Violence: Not At Risk  ? Fear of Current or Ex-Partner: No  ? Emotionally Abused: No  ? Physically Abused: No  ? Sexually Abused: No  ? ?Family  Status  ?Relation Name Status  ? Mother  Deceased at age 50  ? Father  Deceased at age 25  ? Sister  Alive  ? MGM  Deceased at age 63  ?     MI  ? Sister HALF SISTER Alive  ? ?Family History  ?Problem Relation Age of Onset  ? Emphysema Mother   ? Lung cancer Father   ? Healthy Sister   ? Heart attack Sister 63  ? Heart attack Maternal Grandmother   ? Breast cancer Sister   ? ?Allergies  ?Allergen Reactions  ? Cipro [Ciprofloxacin-Ciproflox Hcl Er] Other (See Comments)  ? Ciprofloxacin   ?  Other reaction(s): Joint Pains  ? Tetanus Toxoids Hives  ? Tetanus Toxoids Rash  ?  ?Patient Care Team: ?Gwyneth Sprout, FNP as PCP - General (Family Medicine) ?Arelia Sneddon, OD as Consulting Physician  (Optometry) ?Merril Abbe, MD as Referring Physician (Dermatology) ?Oneta Rack, MD as Consulting Physician (Dermatology) ?Chauncey Mann, MD as Referring Physician (Psychiatry) ?Sharlet Salina, MD as Referring Physician (Physical Medicine and Rehabilitation) ?Meeler, Sherren Kerns, FNP (Family Medicine) ?Anabel Bene, MD as Referring Physician (Neurology) ?Jannifer Franklin, NP as Nurse Practitioner (Neurology) ?Carloyn Manner, MD as Referring Physician (Otolaryngology)  ? ?Medications: ?Outpatient Medications Prior to Visit  ?Medication Sig  ? AIMOVIG 140 MG/ML SOAJ INJECT 140 MG INTO THE SKIN EVERY 30 DAYS  ? Cholecalciferol 1000 UNITS capsule Take 1,000 Units by mouth daily.   ? Erenumab-aooe (AIMOVIG) 140 MG/ML SOAJ Inject into the skin.  ? Erenumab-aooe 140 MG/ML SOAJ Inject into the skin.  ? Flaxseed, Linseed, (FLAX SEEDS PO) Take 1 capsule by mouth daily.  ? HYDROcodone-acetaminophen (NORCO) 10-325 MG tablet Take by mouth.  ? ketoconazole (NIZORAL) 2 % shampoo PLEASE SEE ATTACHED FOR DETAILED DIRECTIONS  ? Multiple Vitamin (MULTI-VITAMIN) tablet Take 1 tablet by mouth daily.  ? MULTIPLE VITAMINS-MINERALS PO Take 1 tablet by mouth daily.   ? Omega-3 Fatty Acids (FISH OIL) 1000 MG CAPS Take 1 capsule by mouth daily.  ? OVER THE COUNTER MEDICATION Numbing cream for neck pain as needed  ? pantoprazole (PROTONIX) 40 MG tablet Take 40 mg by mouth daily.  ? Polyethylene Glycol 3350 (MIRALAX PO) Take 17 g by mouth daily.   ? [DISCONTINUED] gabapentin (NEURONTIN) 100 MG capsule 2 po qHS  ? [DISCONTINUED] gabapentin (NEURONTIN) 100 MG capsule Take 200 mg by mouth at bedtime.  ? [DISCONTINUED] HYDROcodone-acetaminophen (NORCO) 10-325 MG tablet Take 1 tablet by mouth every 4 (four) hours as needed.  ? [DISCONTINUED] lisdexamfetamine (VYVANSE) 50 MG capsule Take 1 capsule (50 mg total) by mouth daily. Due for office follow up. Please keep next appt.  ? [DISCONTINUED] rOPINIRole (REQUIP) 0.25 MG tablet  Take 1 tablet (0.25 mg total) by mouth at bedtime. Due for office visit for follow up.  ? [DISCONTINUED] rOPINIRole (REQUIP) 0.25 MG tablet Take by mouth.  ? [DISCONTINUED] sertraline (ZOLOFT) 100 MG tablet TAKE 2 TABLETS BY MOUTH EVERY DAY  ? [DISCONTINUED] SUMAtriptan (IMITREX) 100 MG tablet TAKE 1 TAB BY MOUTH ONCE AT HEADACHE ONSET, MAY REPEAT AFTER 2 HRS, MAX 2 TABS/DAY, 4 TABS/WEEK  ? [DISCONTINUED] traZODone (DESYREL) 50 MG tablet TAKE 1 TO 2 TABLETS BY MOUTH AT BEDTIME AS NEEDED FOR SLEEP  ? [DISCONTINUED] cyclobenzaprine (FLEXERIL) 10 MG tablet Take 10 mg by mouth at bedtime. (Patient not taking: Reported on 01/01/2022)  ? [DISCONTINUED] venlafaxine XR (EFFEXOR-XR) 150 MG 24 hr capsule TAKE 1 CAPSULE BY MOUTH EVERY  DAY (Patient not taking: Reported on 01/01/2022)  ? ?No facility-administered medications prior to visit.  ? ? ?Review of Systems ? ? ? Objective  ? ?  ?BP 122/82 (BP Location: Left Arm, Patient Position: Sitting, Cuff Size: Normal)   Pulse (!) 103   Temp 98.4 ?F (36.9 ?C) (Oral)   Resp 16   Ht '5\' 4"'$  (1.626 m)   Wt 176 lb 4.8 oz (80 kg)   SpO2 97%   BMI 30.26 kg/m?  ? ? ? ?Physical Exam  ? ? ?Last depression screening scores ? ?  01/24/2022  ? 10:24 AM 01/01/2022  ?  1:49 PM 06/19/2021  ?  8:58 AM  ?PHQ 2/9 Scores  ?PHQ - 2 Score 2 0 1  ?PHQ- 9 Score 12  7  ? ?Last fall risk screening ? ?  01/24/2022  ? 10:23 AM  ?Fall Risk   ?Falls in the past year? 0  ?Number falls in past yr: 0  ?Injury with Fall? 0  ? ?Last Audit-C alcohol use screening ? ?  01/24/2022  ? 10:26 AM  ?Alcohol Use Disorder Test (AUDIT)  ?1. How often do you have a drink containing alcohol? 0  ?2. How many drinks containing alcohol do you have on a typical day when you are drinking? 0  ?3. How often do you have six or more drinks on one occasion? 0  ?AUDIT-C Score 0  ? ?A score of 3 or more in women, and 4 or more in men indicates increased risk for alcohol abuse, EXCEPT if all of the points are from question 1  ? ?No results found  for any visits on 01/24/22. ? Assessment & Plan  ?  ?Routine Health Maintenance and Physical Exam ? ?Exercise Activities and Dietary recommendations ? Goals   ? ?  DIET - EAT MORE FRUITS AND VEGETABLES   ?  Exercise 1

## 2022-01-24 NOTE — Assessment & Plan Note (Signed)
Due to weight of breasts, under breasts ?Worse in warm weather ?Acute on chronic, stable  ?

## 2022-01-24 NOTE — Assessment & Plan Note (Signed)
Repeat Vit D level given fatigue; previously stable with use of supplement  ?

## 2022-01-25 ENCOUNTER — Other Ambulatory Visit: Payer: Self-pay | Admitting: Family Medicine

## 2022-01-25 DIAGNOSIS — E162 Hypoglycemia, unspecified: Secondary | ICD-10-CM

## 2022-01-25 DIAGNOSIS — E559 Vitamin D deficiency, unspecified: Secondary | ICD-10-CM

## 2022-01-25 MED ORDER — VITAMIN D (ERGOCALCIFEROL) 1.25 MG (50000 UNIT) PO CAPS: 50000.0000 [IU] | ORAL_CAPSULE | ORAL | 1 refills | Status: DC

## 2022-01-27 LAB — COMPREHENSIVE METABOLIC PANEL
ALT: 9 IU/L (ref 0–32)
AST: 16 IU/L (ref 0–40)
Albumin/Globulin Ratio: 2 (ref 1.2–2.2)
Albumin: 4.5 g/dL (ref 3.8–4.8)
Alkaline Phosphatase: 115 IU/L (ref 44–121)
BUN/Creatinine Ratio: 30 — ABNORMAL HIGH (ref 12–28)
BUN: 20 mg/dL (ref 8–27)
Bilirubin Total: 0.5 mg/dL (ref 0.0–1.2)
CO2: 22 mmol/L (ref 20–29)
Calcium: 9.6 mg/dL (ref 8.7–10.3)
Chloride: 103 mmol/L (ref 96–106)
Creatinine, Ser: 0.66 mg/dL (ref 0.57–1.00)
Globulin, Total: 2.3 g/dL (ref 1.5–4.5)
Glucose: 90 mg/dL (ref 70–99)
Potassium: 3.7 mmol/L (ref 3.5–5.2)
Sodium: 140 mmol/L (ref 134–144)
Total Protein: 6.8 g/dL (ref 6.0–8.5)
eGFR: 100 mL/min/{1.73_m2} (ref >59)

## 2022-01-27 LAB — IRON,TIBC AND FERRITIN PANEL
Ferritin: 111 ng/mL (ref 15–150)
Iron Saturation: 19 % (ref 15–55)
Iron: 63 ug/dL (ref 27–139)
Total Iron Binding Capacity: 330 ug/dL (ref 250–450)
UIBC: 267 ug/dL (ref 118–369)

## 2022-01-27 LAB — LIPID PANEL
Chol/HDL Ratio: 3.1 ratio (ref 0.0–4.4)
Cholesterol, Total: 193 mg/dL (ref 100–199)
HDL: 63 mg/dL (ref >39)
LDL Chol Calc (NIH): 120 mg/dL — ABNORMAL HIGH (ref 0–99)
Triglycerides: 52 mg/dL (ref 0–149)
VLDL Cholesterol Cal: 10 mg/dL (ref 5–40)

## 2022-01-27 LAB — CBC WITH DIFFERENTIAL/PLATELET
Basophils Absolute: 0.1 10*3/uL (ref 0.0–0.2)
Basos: 1 %
EOS (ABSOLUTE): 0.1 10*3/uL (ref 0.0–0.4)
Eos: 1 %
Hematocrit: 37.5 % (ref 34.0–46.6)
Hemoglobin: 13 g/dL (ref 11.1–15.9)
Immature Grans (Abs): 0 10*3/uL (ref 0.0–0.1)
Immature Granulocytes: 0 %
Lymphocytes Absolute: 1.9 10*3/uL (ref 0.7–3.1)
Lymphs: 34 %
MCH: 29.6 pg (ref 26.6–33.0)
MCHC: 34.7 g/dL (ref 31.5–35.7)
MCV: 85 fL (ref 79–97)
Monocytes Absolute: 0.9 10*3/uL (ref 0.1–0.9)
Monocytes: 15 %
Neutrophils Absolute: 2.8 10*3/uL (ref 1.4–7.0)
Neutrophils: 49 %
Platelets: 211 10*3/uL (ref 150–450)
RBC: 4.39 x10E6/uL (ref 3.77–5.28)
RDW: 12.8 % (ref 11.7–15.4)
WBC: 5.7 10*3/uL (ref 3.4–10.8)

## 2022-01-27 LAB — TSH+FREE T4
Free T4: 1.51 ng/dL (ref 0.82–1.77)
TSH: 1.44 u[IU]/mL (ref 0.450–4.500)

## 2022-01-27 LAB — B12 AND FOLATE PANEL
Folate: 8.9 ng/mL (ref >3.0)
Vitamin B-12: 253 pg/mL (ref 232–1245)

## 2022-01-27 LAB — HEMOGLOBIN A1C
Est. average glucose Bld gHb Est-mCnc: 91 mg/dL
Hgb A1c MFr Bld: 4.8 % (ref 4.8–5.6)

## 2022-01-27 LAB — VITAMIN D 25 HYDROXY (VIT D DEFICIENCY, FRACTURES): Vit D, 25-Hydroxy: 13.9 ng/mL — ABNORMAL LOW (ref 30.0–100.0)

## 2022-01-27 LAB — VITAMIN B6: Vitamin B6: 4.8 ug/L (ref 3.4–65.2)

## 2022-01-28 LAB — CYTOLOGY - PAP: Diagnosis: NEGATIVE

## 2022-01-29 ENCOUNTER — Telehealth: Payer: Self-pay

## 2022-01-29 NOTE — Telephone Encounter (Signed)
PA Started today. ?

## 2022-01-29 NOTE — Telephone Encounter (Signed)
Patient reports that she was prescribed the following medication ?nystatin cream (MYCOSTATIN)  ?States that it has made her more itchy and is wondering if something else can be send in please. ?

## 2022-01-29 NOTE — Telephone Encounter (Signed)
LMTCB- Ok for [facility] nurse to give providers message to patient. ?

## 2022-01-30 NOTE — Telephone Encounter (Signed)
Patient called, left VM to return the call to the office for a message from the provider. 

## 2022-01-31 NOTE — Telephone Encounter (Signed)
Patient called, left VM to return the call to the office for a message from the provider. 

## 2022-01-31 NOTE — Telephone Encounter (Signed)
Pt called in, was given recommendations from [place], NP. Pt states she has tried OTC hydrocortisone cream and it helps with itching but doesn't heal the rash. She will try the OTC antifungal and if it doesn't help call back to schedule OV. She is also asking about the PA on the [facility] if it was approved or not. I called and spoke with [place], [place] who states Daneil Dan and her nurse were there and someone would up with pt tomorrow. Advised pt of this and she verbalized understanding.  ?

## 2022-02-01 NOTE — Telephone Encounter (Signed)
LMTCB, I initiated another pre-authorization which cover my meds responded that this pre-authorization has already been denied. Patient will need to contact her insurance to start appeal. P[facility] nurse please advise. KW ?

## 2022-02-04 NOTE — Telephone Encounter (Signed)
Patient called, left VM to return the call to the office. 

## 2022-02-11 ENCOUNTER — Other Ambulatory Visit: Payer: Self-pay | Admitting: Family Medicine

## 2022-02-11 DIAGNOSIS — F902 Attention-deficit hyperactivity disorder, combined type: Secondary | ICD-10-CM

## 2022-02-12 DIAGNOSIS — M5416 Radiculopathy, lumbar region: Secondary | ICD-10-CM | POA: Diagnosis not present

## 2022-02-12 DIAGNOSIS — M5126 Other intervertebral disc displacement, lumbar region: Secondary | ICD-10-CM | POA: Diagnosis not present

## 2022-02-12 MED ORDER — LISDEXAMFETAMINE DIMESYLATE 50 MG PO CAPS: 50.0000 mg | ORAL_CAPSULE | Freq: Every day | ORAL | 0 refills | Status: DC

## 2022-03-03 DIAGNOSIS — J029 Acute pharyngitis, unspecified: Secondary | ICD-10-CM | POA: Diagnosis not present

## 2022-03-07 DIAGNOSIS — M4802 Spinal stenosis, cervical region: Secondary | ICD-10-CM | POA: Diagnosis not present

## 2022-03-07 DIAGNOSIS — M5412 Radiculopathy, cervical region: Secondary | ICD-10-CM | POA: Diagnosis not present

## 2022-03-18 ENCOUNTER — Other Ambulatory Visit: Payer: Self-pay | Admitting: Family Medicine

## 2022-03-18 DIAGNOSIS — F902 Attention-deficit hyperactivity disorder, combined type: Secondary | ICD-10-CM

## 2022-03-18 MED ORDER — LISDEXAMFETAMINE DIMESYLATE 50 MG PO CAPS: 50.0000 mg | ORAL_CAPSULE | Freq: Every day | ORAL | 0 refills | Status: DC

## 2022-04-02 DIAGNOSIS — M5136 Other intervertebral disc degeneration, lumbar region: Secondary | ICD-10-CM | POA: Diagnosis not present

## 2022-04-02 DIAGNOSIS — M5412 Radiculopathy, cervical region: Secondary | ICD-10-CM | POA: Diagnosis not present

## 2022-04-02 DIAGNOSIS — M5030 Other cervical disc degeneration, unspecified cervical region: Secondary | ICD-10-CM | POA: Diagnosis not present

## 2022-04-02 DIAGNOSIS — M5416 Radiculopathy, lumbar region: Secondary | ICD-10-CM | POA: Diagnosis not present

## 2022-04-15 ENCOUNTER — Other Ambulatory Visit: Payer: Self-pay | Admitting: Family Medicine

## 2022-04-15 DIAGNOSIS — F902 Attention-deficit hyperactivity disorder, combined type: Secondary | ICD-10-CM

## 2022-04-16 MED ORDER — LISDEXAMFETAMINE DIMESYLATE 50 MG PO CAPS: 50.0000 mg | ORAL_CAPSULE | Freq: Every day | ORAL | 0 refills | Status: DC

## 2022-05-16 ENCOUNTER — Other Ambulatory Visit: Payer: Self-pay | Admitting: Family Medicine

## 2022-05-16 DIAGNOSIS — F902 Attention-deficit hyperactivity disorder, combined type: Secondary | ICD-10-CM

## 2022-05-16 MED ORDER — LISDEXAMFETAMINE DIMESYLATE 50 MG PO CAPS: 50.0000 mg | ORAL_CAPSULE | Freq: Every day | ORAL | 0 refills | Status: DC

## 2022-05-20 DIAGNOSIS — M47816 Spondylosis without myelopathy or radiculopathy, lumbar region: Secondary | ICD-10-CM | POA: Diagnosis not present

## 2022-05-20 DIAGNOSIS — M5416 Radiculopathy, lumbar region: Secondary | ICD-10-CM | POA: Diagnosis not present

## 2022-05-20 DIAGNOSIS — M5136 Other intervertebral disc degeneration, lumbar region: Secondary | ICD-10-CM | POA: Diagnosis not present

## 2022-05-30 DIAGNOSIS — M5416 Radiculopathy, lumbar region: Secondary | ICD-10-CM | POA: Diagnosis not present

## 2022-05-30 DIAGNOSIS — M5126 Other intervertebral disc displacement, lumbar region: Secondary | ICD-10-CM | POA: Diagnosis not present

## 2022-06-14 ENCOUNTER — Other Ambulatory Visit: Payer: Self-pay | Admitting: Family Medicine

## 2022-06-14 DIAGNOSIS — F902 Attention-deficit hyperactivity disorder, combined type: Secondary | ICD-10-CM

## 2022-06-14 MED ORDER — LISDEXAMFETAMINE DIMESYLATE 50 MG PO CAPS: 50.0000 mg | ORAL_CAPSULE | Freq: Every day | ORAL | 0 refills | Status: DC

## 2022-07-10 ENCOUNTER — Other Ambulatory Visit: Payer: Self-pay | Admitting: Family Medicine

## 2022-07-10 ENCOUNTER — Telehealth (INDEPENDENT_AMBULATORY_CARE_PROVIDER_SITE_OTHER): Payer: Medicare HMO | Admitting: Family Medicine

## 2022-07-10 DIAGNOSIS — F411 Generalized anxiety disorder: Secondary | ICD-10-CM | POA: Diagnosis not present

## 2022-07-10 DIAGNOSIS — Z1231 Encounter for screening mammogram for malignant neoplasm of breast: Secondary | ICD-10-CM

## 2022-07-10 DIAGNOSIS — M792 Neuralgia and neuritis, unspecified: Secondary | ICD-10-CM | POA: Diagnosis not present

## 2022-07-10 DIAGNOSIS — G2581 Restless legs syndrome: Secondary | ICD-10-CM | POA: Diagnosis not present

## 2022-07-10 DIAGNOSIS — R632 Polyphagia: Secondary | ICD-10-CM

## 2022-07-10 DIAGNOSIS — N6459 Other signs and symptoms in breast: Secondary | ICD-10-CM | POA: Insufficient documentation

## 2022-07-10 DIAGNOSIS — G8929 Other chronic pain: Secondary | ICD-10-CM | POA: Insufficient documentation

## 2022-07-10 DIAGNOSIS — R69 Illness, unspecified: Secondary | ICD-10-CM | POA: Diagnosis not present

## 2022-07-10 MED ORDER — ROPINIROLE HCL 0.25 MG PO TABS: 0.2500 mg | ORAL_TABLET | Freq: Every day | ORAL | 1 refills | Status: DC

## 2022-07-10 MED ORDER — LISDEXAMFETAMINE DIMESYLATE 50 MG PO CAPS: 50.0000 mg | ORAL_CAPSULE | Freq: Every day | ORAL | 0 refills | Status: DC

## 2022-07-10 NOTE — Assessment & Plan Note (Signed)
Chronic, stable Request for refills

## 2022-07-10 NOTE — Progress Notes (Signed)
MyChart Video Visit    Virtual Visit via Video Note   This visit type was conducted due to national recommendations for restrictions regarding the COVID-19 Pandemic (e.g. social distancing) in an effort to limit this patient's exposure and mitigate transmission in our community. This patient is at least at moderate risk for complications without adequate follow up. This format is felt to be most appropriate for this patient at this time. Physical exam was limited by quality of the video and audio technology used for the visit.   Patient location: home Provider location: [facility] [address]  [address] [place], [place] [address]   I discussed the limitations of evaluation and management by telemedicine and the availability of in person appointments. The patient expressed understanding and agreed to proceed.  Patient: Krystal Simpson   DOB: 1961-06-04   61 y.o. Female  MRN: [id number] Visit Date: 07/10/2022  Today's healthcare provider: Gwyneth Sprout, FNP  Introduced to nurse practitioner role and practice setting.  All questions answered.  Discussed provider/patient relationship and expectations.   I,Tiffany J Bragg,acting as a scribe for Gwyneth Sprout, FNP.,have documented all relevant documentation on the behalf of Gwyneth Sprout, FNP,as directed by  Gwyneth Sprout, FNP while in the presence of Gwyneth Sprout, FNP.   Chief Complaint  Patient presents with   Breast Problem    Patient complains of neck pain and pulling in L breast and breast feels different.    Depression   Subjective    HPI HPI     Breast Problem    Additional comments: Patient complains of neck pain and pulling in L breast and breast feels different.       Last edited by Smitty Knudsen, CMA on 07/10/2022  1:23 PM.      Anxiety/Depression, Follow-up  She was last seen for anxiety 6 months ago. Changes made at last visit include continue medication.   She reports excellent  compliance with treatment. She reports excellent tolerance of treatment. She is not having side effects.   She feels her anxiety is moderate and Worse since last visit.  Symptoms: No chest pain No difficulty concentrating  No dizziness Yes fatigue  No feelings of losing control Yes insomnia  No irritable No palpitations  No panic attacks No racing thoughts  No shortness of breath Yes sweating  No tremors/shakes    GAD-7 Results     No data to display          PHQ-9 Scores    07/10/2022    1:25 PM 01/24/2022   10:24 AM 01/01/2022    1:49 PM  PHQ9 SCORE ONLY  PHQ-9 Total Score 5 12 0    ---------------------------------------------------------------------------------------------------    Medications: Outpatient Medications Prior to Visit  Medication Sig   AIMOVIG 140 MG/ML SOAJ INJECT 140 MG INTO THE SKIN EVERY 30 DAYS   Cholecalciferol 1000 UNITS capsule Take 1,000 Units by mouth daily.    Erenumab-aooe (AIMOVIG) 140 MG/ML SOAJ Inject into the skin.   Erenumab-aooe 140 MG/ML SOAJ Inject into the skin.   Flaxseed, Linseed, (FLAX SEEDS PO) Take 1 capsule by mouth daily.   HYDROcodone-acetaminophen (NORCO) 10-325 MG tablet Take by mouth.   ketoconazole (NIZORAL) 2 % shampoo PLEASE SEE ATTACHED FOR DETAILED DIRECTIONS   Multiple Vitamin (MULTI-VITAMIN) tablet Take 1 tablet by mouth daily.   MULTIPLE VITAMINS-MINERALS PO Take 1 tablet by mouth daily.    nystatin cream (MYCOSTATIN) Apply 1 application. topically  2 (two) times daily.   Omega-3 Fatty Acids (FISH OIL) 1000 MG CAPS Take 1 capsule by mouth daily.   OVER THE COUNTER MEDICATION Numbing cream for neck pain as needed   pantoprazole (PROTONIX) 40 MG tablet Take 40 mg by mouth daily.   Polyethylene Glycol 3350 (MIRALAX PO) Take 17 g by mouth daily.    Semaglutide-Weight Management 0.25 MG/0.5ML SOAJ Inject 0.25 mg into the skin once a week.   sertraline (ZOLOFT) 100 MG tablet Take 2 tablets (200 mg total) by mouth  daily.   SUMAtriptan (IMITREX) 100 MG tablet TAKE 1 TAB BY MOUTH ONCE AT HEADACHE ONSET, MAY REPEAT AFTER 2 HRS, MAX 2 TABS/DAY, 4 TABS/WEEK   traZODone (DESYREL) 50 MG tablet Take 1-2 tablets (50-100 mg total) by mouth at bedtime as needed.   Vitamin D, Ergocalciferol, (DRISDOL) 1.25 MG (50000 UNIT) CAPS capsule Take 1 capsule (50,000 Units total) by mouth every 7 (seven) days.   [DISCONTINUED] lisdexamfetamine (VYVANSE) 50 MG capsule Take 1 capsule (50 mg total) by mouth daily.   [DISCONTINUED] rOPINIRole (REQUIP) 0.25 MG tablet Take 1 tablet (0.25 mg total) by mouth at bedtime.   No facility-administered medications prior to visit.    Review of Systems   Objective    There were no vitals taken for this visit.   Physical Exam Pulmonary:     Effort: Pulmonary effort is normal.  Neurological:     General: No focal deficit present.     Mental Status: She is alert.  Psychiatric:        Mood and Affect: Mood normal.        Behavior: Behavior normal.        Thought Content: Thought content normal.        Judgment: Judgment normal.        Assessment & Plan     Problem List Items Addressed This Visit       Other   Binge eating    Chronic, stable Continue Vyvanse to assist Denies additional complaints outside occasionally finding items to wear which can peak anxiety       Relevant Medications   lisdexamfetamine (VYVANSE) 50 MG capsule   GAD (generalized anxiety disorder)    Chronic, waxes/wanes Denies additional medication changes/additions today       Inversion of left nipple - Primary    Acute; s/p neck/back injections at specialist Would like breast screenings to be ordered; denies breast discharge       Nerve pain    Chronic, stable Request for refills       Relevant Medications   rOPINIRole (REQUIP) 0.25 MG tablet   RLS (restless legs syndrome)    Chronic, stable Request for refills      Relevant Medications   rOPINIRole (REQUIP) 0.25 MG tablet    Screening mammogram for breast cancer    Due for mammo; reports L breast size, pain, and nipple changes        No follow-ups on file.     I discussed the assessment and treatment plan with the patient. The patient was provided an opportunity to ask questions and all were answered. The patient agreed with the plan and demonstrated an understanding of the instructions.   The patient was advised to call back or seek an in-person evaluation if the symptoms worsen or if the condition fails to improve as anticipated.  I provided 15 minutes of face-to-face time during this encounter.  Vonna Kotyk, FNP, have reviewed all documentation  for this visit. The documentation on 07/10/22 for the exam, diagnosis, procedures, and orders are all accurate and complete.   Gwyneth Sprout, [place] [phone] (phone) [phone] (fax)  [place]

## 2022-07-10 NOTE — Assessment & Plan Note (Signed)
Chronic, stable Request for refills 

## 2022-07-10 NOTE — Patient Instructions (Signed)
Please call and schedule your diagnostic and screening mammogram:  [facility] at [facility]  [address], [place],  [place]  [id number] Get Driving Directions Main: [phone]  Sunday:Closed Monday:7:20 AM - 5:00 PM Tuesday:7:20 AM - 5:00 PM Wednesday:7:20 AM - 5:00 PM Thursday:7:20 AM - 5:00 PM Friday:7:20 AM - 4:30 PM Saturday:Closed

## 2022-07-10 NOTE — Assessment & Plan Note (Signed)
Chronic, waxes/wanes Denies additional medication changes/additions today

## 2022-07-10 NOTE — Assessment & Plan Note (Signed)
Chronic, stable Continue Vyvanse to assist Denies additional complaints outside occasionally finding items to wear which can peak anxiety

## 2022-07-10 NOTE — Assessment & Plan Note (Signed)
Acute; s/p neck/back injections at specialist Would like breast screenings to be ordered; denies breast discharge

## 2022-07-10 NOTE — Assessment & Plan Note (Signed)
Due for mammo; reports L breast size, pain, and nipple changes

## 2022-07-14 DIAGNOSIS — R399 Unspecified symptoms and signs involving the genitourinary system: Secondary | ICD-10-CM | POA: Diagnosis not present

## 2022-07-14 DIAGNOSIS — R10819 Abdominal tenderness, unspecified site: Secondary | ICD-10-CM | POA: Diagnosis not present

## 2022-07-14 DIAGNOSIS — R319 Hematuria, unspecified: Secondary | ICD-10-CM | POA: Diagnosis not present

## 2022-07-18 ENCOUNTER — Other Ambulatory Visit: Payer: Self-pay | Admitting: Family Medicine

## 2022-07-18 DIAGNOSIS — G43109 Migraine with aura, not intractable, without status migrainosus: Secondary | ICD-10-CM

## 2022-07-23 ENCOUNTER — Ambulatory Visit: Payer: Medicare HMO | Admitting: Family Medicine

## 2022-07-25 ENCOUNTER — Ambulatory Visit
Admission: RE | Admit: 2022-07-25 | Discharge: 2022-07-25 | Disposition: A | Discharge status: Home / Self Care | Payer: Medicare HMO | Source: Ambulatory Visit | Attending: Family Medicine | Admitting: Family Medicine

## 2022-07-25 DIAGNOSIS — N6324 Unspecified lump in the left breast, lower inner quadrant: Secondary | ICD-10-CM | POA: Diagnosis not present

## 2022-07-25 DIAGNOSIS — N6459 Other signs and symptoms in breast: Secondary | ICD-10-CM | POA: Diagnosis not present

## 2022-07-25 DIAGNOSIS — R92333 Mammographic heterogeneous density, bilateral breasts: Secondary | ICD-10-CM | POA: Diagnosis not present

## 2022-07-25 DIAGNOSIS — R9230 Dense breasts, unspecified: Secondary | ICD-10-CM | POA: Diagnosis not present

## 2022-07-26 NOTE — Progress Notes (Signed)
Hi Krystal Simpson  Normal mammogram; repeat in 1 year.  Please let us know if you have any questions.  Thank you,  Tally Joe, FNP

## 2022-08-05 DIAGNOSIS — R200 Anesthesia of skin: Secondary | ICD-10-CM | POA: Diagnosis not present

## 2022-08-05 DIAGNOSIS — R202 Paresthesia of skin: Secondary | ICD-10-CM | POA: Diagnosis not present

## 2022-08-05 DIAGNOSIS — R519 Headache, unspecified: Secondary | ICD-10-CM | POA: Diagnosis not present

## 2022-08-05 DIAGNOSIS — G479 Sleep disorder, unspecified: Secondary | ICD-10-CM | POA: Diagnosis not present

## 2022-08-05 DIAGNOSIS — G2581 Restless legs syndrome: Secondary | ICD-10-CM | POA: Diagnosis not present

## 2022-08-06 DIAGNOSIS — M5136 Other intervertebral disc degeneration, lumbar region: Secondary | ICD-10-CM | POA: Diagnosis not present

## 2022-08-06 DIAGNOSIS — M7918 Myalgia, other site: Secondary | ICD-10-CM | POA: Diagnosis not present

## 2022-08-06 DIAGNOSIS — M5126 Other intervertebral disc displacement, lumbar region: Secondary | ICD-10-CM | POA: Diagnosis not present

## 2022-08-06 DIAGNOSIS — M5416 Radiculopathy, lumbar region: Secondary | ICD-10-CM | POA: Diagnosis not present

## 2022-08-06 DIAGNOSIS — M47816 Spondylosis without myelopathy or radiculopathy, lumbar region: Secondary | ICD-10-CM | POA: Diagnosis not present

## 2022-08-12 ENCOUNTER — Other Ambulatory Visit: Payer: Self-pay | Admitting: Family Medicine

## 2022-08-12 DIAGNOSIS — R632 Polyphagia: Secondary | ICD-10-CM

## 2022-08-13 ENCOUNTER — Other Ambulatory Visit: Payer: Self-pay | Admitting: Family Medicine

## 2022-08-13 DIAGNOSIS — R632 Polyphagia: Secondary | ICD-10-CM

## 2022-08-13 MED ORDER — LISDEXAMFETAMINE DIMESYLATE 50 MG PO CAPS: 50.0000 mg | ORAL_CAPSULE | Freq: Every day | ORAL | 0 refills | Status: DC

## 2022-08-19 ENCOUNTER — Encounter: Payer: Self-pay | Admitting: Family Medicine

## 2022-08-20 ENCOUNTER — Other Ambulatory Visit: Payer: Self-pay | Admitting: Family Medicine

## 2022-08-20 DIAGNOSIS — R632 Polyphagia: Secondary | ICD-10-CM

## 2022-08-20 NOTE — Telephone Encounter (Signed)
Requested medications are due for refill today. unsure  Requested medications are on the active medications list.  yes  Last refill. 08/13/2022 #30 0 rf  Future visit scheduled.   yes  Notes to clinic.  Refill not delegated.    Requested Prescriptions  Pending Prescriptions Disp Refills   lisdexamfetamine (VYVANSE) 50 MG capsule 30 capsule 0    Sig: Take 1 capsule (50 mg total) by mouth daily.     Not Delegated - Psychiatry:  Stimulants/ADHD Failed - 08/20/2022 10:19 AM      Failed - This refill cannot be delegated      Failed - Urine Drug Screen completed in last 360 days      Passed - Last BP in normal range    BP Readings from Last 1 Encounters:  01/24/22 122/82         Passed - Last Heart Rate in normal range    Pulse Readings from Last 1 Encounters:  01/24/22 (!) 103         Passed - Valid encounter within last 6 months    Recent Outpatient Visits           1 month ago Inversion of left nipple   [facility] Gwyneth Sprout, FNP   6 months ago Annual physical exam   [facility] Tally Joe T, FNP   1 year ago Chronic right-sided low back pain without sciatica   [facility], Dionne Bucy, MD   2 years ago Fall, initial encounter   [facility], Clearnce Sorrel, PA-C   3 years ago [facility], Clearnce Sorrel, [place]       Future Appointments             In 5 months Rollene Rotunda, Jaci Standard, [place], [place]

## 2022-08-20 NOTE — Telephone Encounter (Signed)
Copied from [place]. Topic: General - Other >> Aug 20, 2022  9:16 AM Everette C wrote: Reason for CRM: Medication Refill - Medication: lisdexamfetamine (VYVANSE) 50 MG capsule [[id number]]  Has the patient contacted their pharmacy? Yes.   (Agent: If no, request that the patient contact the pharmacy for the refill. If patient does not wish to contact the pharmacy document the reason why and proceed with request.) (Agent: If yes, when and what did the pharmacy advise?)  Preferred Pharmacy (with phone number or street name): [id number] - [place], [place] - [address] [place] [address] Phone: [phone] Fax: [phone] Hours: Not open 24 hours   Has the patient been seen for an appointment in the last year OR does the patient have an upcoming appointment? Yes.    Agent: Please be advised that RX refills may take up to 3 business days. We ask that you follow-up with your pharmacy.

## 2022-08-26 ENCOUNTER — Other Ambulatory Visit: Payer: Self-pay | Admitting: Family Medicine

## 2022-08-26 DIAGNOSIS — R632 Polyphagia: Secondary | ICD-10-CM

## 2022-08-26 MED ORDER — LISDEXAMFETAMINE DIMESYLATE 50 MG PO CAPS: 50.0000 mg | ORAL_CAPSULE | Freq: Every day | ORAL | 0 refills | Status: DC

## 2022-08-29 DIAGNOSIS — M47816 Spondylosis without myelopathy or radiculopathy, lumbar region: Secondary | ICD-10-CM | POA: Diagnosis not present

## 2022-09-17 ENCOUNTER — Other Ambulatory Visit: Payer: Self-pay | Admitting: Family Medicine

## 2022-09-17 DIAGNOSIS — G2581 Restless legs syndrome: Secondary | ICD-10-CM

## 2022-09-17 DIAGNOSIS — M792 Neuralgia and neuritis, unspecified: Secondary | ICD-10-CM

## 2022-09-18 DIAGNOSIS — M47816 Spondylosis without myelopathy or radiculopathy, lumbar region: Secondary | ICD-10-CM | POA: Diagnosis not present

## 2022-09-24 ENCOUNTER — Other Ambulatory Visit: Payer: Self-pay | Admitting: Family Medicine

## 2022-09-24 DIAGNOSIS — R632 Polyphagia: Secondary | ICD-10-CM

## 2022-09-24 MED ORDER — LISDEXAMFETAMINE DIMESYLATE 50 MG PO CAPS: 50.0000 mg | ORAL_CAPSULE | Freq: Every day | ORAL | 0 refills | Status: DC

## 2022-10-21 ENCOUNTER — Encounter: Payer: Self-pay | Admitting: Family Medicine

## 2022-10-21 ENCOUNTER — Telehealth: Payer: Self-pay | Admitting: Family Medicine

## 2022-10-21 ENCOUNTER — Ambulatory Visit (INDEPENDENT_AMBULATORY_CARE_PROVIDER_SITE_OTHER): Payer: Medicare HMO | Admitting: Family Medicine

## 2022-10-21 VITALS — BP 136/77 | HR 97 | Temp 97.7°F | Wt 184.1 lb

## 2022-10-21 DIAGNOSIS — H9201 Otalgia, right ear: Secondary | ICD-10-CM | POA: Insufficient documentation

## 2022-10-21 DIAGNOSIS — R220 Localized swelling, mass and lump, head: Secondary | ICD-10-CM | POA: Insufficient documentation

## 2022-10-21 DIAGNOSIS — J0100 Acute maxillary sinusitis, unspecified: Secondary | ICD-10-CM | POA: Insufficient documentation

## 2022-10-21 NOTE — Patient Instructions (Signed)
Discussed red flags/concerning symptoms that would require patient to seek emergent call or call 9-1-1 including but not limited to change is mass size, change in vision, change in speech, uncontrolled headache, new weakness, drooling, or anything that is concerning to the patient.   Lab work should return 1/30  Imaging ordered; you will be contacted for scheduling.

## 2022-10-21 NOTE — Assessment & Plan Note (Signed)
Acute, not improving with NSAIDs or APAP. Has been seen since dental procedure (3 weeks prior for new mouth guard as well as implant) by a covering dentist that noted "I've never seen anything like this before" however, pt was not started on any ABX for possible abscess. Hx of TMJ; self limiting. Using a bite guard.  Recommend imaging

## 2022-10-21 NOTE — Assessment & Plan Note (Signed)
Normal exam; likely in setting of periauricular mass Recommend imaging

## 2022-10-21 NOTE — Telephone Encounter (Signed)
Kendra from [facility] called in requesting PA for CT scan.

## 2022-10-21 NOTE — Progress Notes (Signed)
I,Connie R Striblin,acting as a Education administrator for Gwyneth Sprout, FNP.,have documented all relevant documentation on the behalf of Gwyneth Sprout, FNP,as directed by  Gwyneth Sprout, FNP while in the presence of Gwyneth Sprout, FNP.  Established patient visit  Patient: Krystal Simpson   DOB: 23-Jun-1961   62 y.o. Female  MRN: [id number] Visit Date: 10/21/2022  Today's healthcare provider: Gwyneth Sprout, FNP  Introduced to nurse practitioner role and practice setting.  All questions answered.  Discussed provider/patient relationship and expectations.  Subjective    HPI  Mass: Mass is located on the outside of her right ear. Mass has been present for 2 weeks . Associated symptoms includes pain and swelling in ear, face, and neck  Pt complains of possible ear infection. Pt had recent dental procedure a week before symptoms began.     Medications: Outpatient Medications Prior to Visit  Medication Sig   AIMOVIG 140 MG/ML SOAJ INJECT 140 MG INTO THE SKIN EVERY 30 DAYS   Cholecalciferol 1000 UNITS capsule Take 1,000 Units by mouth daily.    Erenumab-aooe (AIMOVIG) 140 MG/ML SOAJ Inject into the skin.   Erenumab-aooe 140 MG/ML SOAJ Inject into the skin.   Flaxseed, Linseed, (FLAX SEEDS PO) Take 1 capsule by mouth daily.   ketoconazole (NIZORAL) 2 % shampoo PLEASE SEE ATTACHED FOR DETAILED DIRECTIONS   lisdexamfetamine (VYVANSE) 50 MG capsule Take 1 capsule (50 mg total) by mouth daily.   Multiple Vitamin (MULTI-VITAMIN) tablet Take 1 tablet by mouth daily.   MULTIPLE VITAMINS-MINERALS PO Take 1 tablet by mouth daily.    nystatin cream (MYCOSTATIN) Apply 1 application. topically 2 (two) times daily.   Omega-3 Fatty Acids (FISH OIL) 1000 MG CAPS Take 1 capsule by mouth daily.   OVER THE COUNTER MEDICATION Numbing cream for neck pain as needed   Polyethylene Glycol 3350 (MIRALAX PO) Take 17 g by mouth daily.    rOPINIRole (REQUIP) 0.25 MG tablet Take 1 tablet (0.25 mg total) by mouth at bedtime.    sertraline (ZOLOFT) 100 MG tablet TAKE 2 TABLETS BY MOUTH EVERY DAY   SUMAtriptan (IMITREX) 100 MG tablet TAKE 1 TAB BY MOUTH ONCE AT HEADACHE ONSET, MAY REPEAT AFTER 2 HRS, MAX 2 TABS/DAY, 4 TABS/WEEK   traZODone (DESYREL) 50 MG tablet Take 1-2 tablets (50-100 mg total) by mouth at bedtime as needed.   Vitamin D, Ergocalciferol, (DRISDOL) 1.25 MG (50000 UNIT) CAPS capsule Take 1 capsule (50,000 Units total) by mouth every 7 (seven) days.   HYDROcodone-acetaminophen (NORCO) 10-325 MG tablet Take by mouth. (Patient not taking: Reported on 10/21/2022)   pantoprazole (PROTONIX) 40 MG tablet Take 40 mg by mouth daily. (Patient not taking: Reported on 10/21/2022)   Semaglutide-Weight Management 0.25 MG/0.5ML SOAJ Inject 0.25 mg into the skin once a week. (Patient not taking: Reported on 10/21/2022)   No facility-administered medications prior to visit.    Review of Systems    Objective    BP 136/77 (BP Location: Right Arm, Patient Position: Sitting, Cuff Size: Normal)   Pulse 97   Temp 97.7 F (36.5 C) (Oral)   Wt 184 lb 1.6 oz (83.5 kg)   SpO2 98%   BMI 31.60 kg/m   Physical Exam Vitals and nursing note reviewed.  Constitutional:      General: She is not in acute distress.    Appearance: Normal appearance. She is obese. She is not ill-appearing, toxic-appearing or diaphoretic.  HENT:     Head: Normocephalic and atraumatic.  Comments: Peach: raised, tender mass Purple: areas of tenderness     Right Ear: Tympanic membrane, ear canal and external ear normal. Tenderness present. No swelling. No middle ear effusion. There is no impacted cerumen. Tympanic membrane is not injected, scarred, perforated, erythematous, retracted or bulging. Tympanic membrane has normal mobility.     Left Ear: Tympanic membrane, ear canal and external ear normal. There is no impacted cerumen.     Nose:     Right Sinus: Maxillary sinus tenderness and frontal sinus tenderness present.     Left Sinus: No  maxillary sinus tenderness or frontal sinus tenderness.     Mouth/Throat:     Mouth: Mucous membranes are moist.     Pharynx: Oropharynx is clear. No oropharyngeal exudate or posterior oropharyngeal erythema.  Eyes:     Extraocular Movements: Extraocular movements intact.     Conjunctiva/sclera: Conjunctivae normal.     Pupils: Pupils are equal, round, and reactive to light.  Cardiovascular:     Rate and Rhythm: Normal rate and regular rhythm.     Pulses: Normal pulses.     Heart sounds: Normal heart sounds. No murmur heard.    No friction rub. No gallop.  Pulmonary:     Effort: Pulmonary effort is normal. No respiratory distress.     Breath sounds: Normal breath sounds. No stridor. No wheezing, rhonchi or rales.  Chest:     Chest wall: No tenderness.  Musculoskeletal:        General: No swelling, deformity or signs of injury. Normal range of motion.     Cervical back: Neck supple. Tenderness present.     Right lower leg: No edema.     Left lower leg: No edema.  Skin:    General: Skin is warm and dry.     Capillary Refill: Capillary refill takes less than 2 seconds.     Coloration: Skin is not jaundiced or pale.     Findings: No bruising, erythema, lesion or rash.  Neurological:     General: No focal deficit present.     Mental Status: She is alert and oriented to person, place, and time. Mental status is at baseline.     Cranial Nerves: No cranial nerve deficit.     Sensory: No sensory deficit.     Motor: No weakness.     Coordination: Coordination normal.  Psychiatric:        Mood and Affect: Mood is anxious.        Behavior: Behavior normal.        Thought Content: Thought content normal.        Judgment: Judgment normal.     No results found for any visits on 10/21/22.  Assessment & Plan     Problem List Items Addressed This Visit       Respiratory   Acute non-recurrent maxillary sinusitis    Sinus and ear complaints; likely in setting of periauricular mass  following dental procedure Recommend imaging prior to ABX      Relevant Orders   CBC with Differential/Platelet   Comprehensive Metabolic Panel (CMET)   CT HEAD WO CONTRAST (5MM)     Other   Facial mass - Primary    Acute, not improving with NSAIDs or APAP. Has been seen since dental procedure (3 weeks prior for new mouth guard as well as implant) by a covering dentist that noted "I've never seen anything like this before" however, pt was not started on any ABX for possible abscess.  Hx of TMJ; self limiting. Using a bite guard.  Recommend imaging      Relevant Orders   CBC with Differential/Platelet   Comprehensive Metabolic Panel (CMET)   CT HEAD WO CONTRAST (5MM)   Referred otalgia of right ear    Normal exam; likely in setting of periauricular mass Recommend imaging      Relevant Orders   CBC with Differential/Platelet   Comprehensive Metabolic Panel (CMET)   CT HEAD WO CONTRAST (5MM)   Return if symptoms worsen or fail to improve.     Vonna Kotyk, FNP, have reviewed all documentation for this visit. The documentation on 10/21/22 for the exam, diagnosis, procedures, and orders are all accurate and complete.  Gwyneth Sprout, [place] [phone] (phone) [phone] (fax)  [place]

## 2022-10-21 NOTE — Assessment & Plan Note (Signed)
Sinus and ear complaints; likely in setting of periauricular mass following dental procedure Recommend imaging prior to ABX

## 2022-10-22 LAB — COMPREHENSIVE METABOLIC PANEL
ALT: 13 IU/L (ref 0–32)
AST: 16 IU/L (ref 0–40)
Albumin/Globulin Ratio: 2.2 (ref 1.2–2.2)
Albumin: 4.2 g/dL (ref 3.9–4.9)
Alkaline Phosphatase: 99 IU/L (ref 44–121)
BUN/Creatinine Ratio: 22 (ref 12–28)
BUN: 15 mg/dL (ref 8–27)
Bilirubin Total: 0.5 mg/dL (ref 0.0–1.2)
CO2: 23 mmol/L (ref 20–29)
Calcium: 9.6 mg/dL (ref 8.7–10.3)
Chloride: 107 mmol/L — ABNORMAL HIGH (ref 96–106)
Creatinine, Ser: 0.68 mg/dL (ref 0.57–1.00)
Globulin, Total: 1.9 g/dL (ref 1.5–4.5)
Glucose: 105 mg/dL — ABNORMAL HIGH (ref 70–99)
Potassium: 3.7 mmol/L (ref 3.5–5.2)
Sodium: 143 mmol/L (ref 134–144)
Total Protein: 6.1 g/dL (ref 6.0–8.5)
eGFR: 98 mL/min/{1.73_m2} (ref >59)

## 2022-10-22 LAB — CBC WITH DIFFERENTIAL/PLATELET
Basophils Absolute: 0 10*3/uL (ref 0.0–0.2)
Basos: 1 %
EOS (ABSOLUTE): 0.1 10*3/uL (ref 0.0–0.4)
Eos: 2 %
Hematocrit: 39.9 % (ref 34.0–46.6)
Hemoglobin: 13.4 g/dL (ref 11.1–15.9)
Immature Grans (Abs): 0 10*3/uL (ref 0.0–0.1)
Immature Granulocytes: 0 %
Lymphocytes Absolute: 2.6 10*3/uL (ref 0.7–3.1)
Lymphs: 36 %
MCH: 29.7 pg (ref 26.6–33.0)
MCHC: 33.6 g/dL (ref 31.5–35.7)
MCV: 89 fL (ref 79–97)
Monocytes Absolute: 0.6 10*3/uL (ref 0.1–0.9)
Monocytes: 8 %
Neutrophils Absolute: 3.9 10*3/uL (ref 1.4–7.0)
Neutrophils: 53 %
Platelets: 247 10*3/uL (ref 150–450)
RBC: 4.51 x10E6/uL (ref 3.77–5.28)
RDW: 12.9 % (ref 11.7–15.4)
WBC: 7.2 10*3/uL (ref 3.4–10.8)

## 2022-10-22 NOTE — Progress Notes (Signed)
Labs are normal and stable; reassuring given concern for possible infectious mass from dental procedure. Continue with approval for imaging.

## 2022-10-23 ENCOUNTER — Ambulatory Visit: Payer: Medicare HMO

## 2022-10-24 ENCOUNTER — Other Ambulatory Visit: Payer: Self-pay | Admitting: Family Medicine

## 2022-10-24 DIAGNOSIS — R632 Polyphagia: Secondary | ICD-10-CM

## 2022-10-26 ENCOUNTER — Other Ambulatory Visit: Payer: Self-pay | Admitting: Family Medicine

## 2022-10-26 DIAGNOSIS — R632 Polyphagia: Secondary | ICD-10-CM

## 2022-10-28 MED ORDER — LISDEXAMFETAMINE DIMESYLATE 50 MG PO CAPS: 50.0000 mg | ORAL_CAPSULE | Freq: Every day | ORAL | 0 refills | Status: DC

## 2022-10-30 ENCOUNTER — Telehealth: Payer: Self-pay

## 2022-10-30 NOTE — Telephone Encounter (Signed)
Pt given lab results per notes of Tally Joe FNP  on 10/30/22. Pt verbalized understanding. Pt stated that her dentist has referred her to a periodontist because abx have not helped that area where "knot" is. This it mat be a parotid gland infection. CT scheduled for 11/07/22. Pt wanted PCP to know this information.

## 2022-10-30 NOTE — Telephone Encounter (Signed)
FYI

## 2022-11-04 ENCOUNTER — Other Ambulatory Visit: Payer: Self-pay | Admitting: Family Medicine

## 2022-11-04 DIAGNOSIS — M792 Neuralgia and neuritis, unspecified: Secondary | ICD-10-CM

## 2022-11-04 DIAGNOSIS — G2581 Restless legs syndrome: Secondary | ICD-10-CM

## 2022-11-07 ENCOUNTER — Ambulatory Visit
Admission: RE | Admit: 2022-11-07 | Discharge: 2022-11-07 | Disposition: A | Discharge status: Home / Self Care | Payer: Medicare HMO | Source: Ambulatory Visit | Attending: Family Medicine | Admitting: Family Medicine

## 2022-11-07 DIAGNOSIS — R220 Localized swelling, mass and lump, head: Secondary | ICD-10-CM | POA: Diagnosis present

## 2022-11-07 DIAGNOSIS — J0100 Acute maxillary sinusitis, unspecified: Secondary | ICD-10-CM | POA: Diagnosis present

## 2022-11-07 DIAGNOSIS — H9201 Otalgia, right ear: Secondary | ICD-10-CM | POA: Insufficient documentation

## 2022-11-08 ENCOUNTER — Other Ambulatory Visit: Payer: Self-pay | Admitting: Family Medicine

## 2022-11-08 DIAGNOSIS — R220 Localized swelling, mass and lump, head: Secondary | ICD-10-CM

## 2022-11-08 DIAGNOSIS — J0100 Acute maxillary sinusitis, unspecified: Secondary | ICD-10-CM

## 2022-11-08 DIAGNOSIS — H9201 Otalgia, right ear: Secondary | ICD-10-CM

## 2022-11-08 NOTE — Progress Notes (Signed)
Referral placed to [facility] for second opinion

## 2022-11-25 ENCOUNTER — Other Ambulatory Visit: Payer: Self-pay | Admitting: Family Medicine

## 2022-11-25 DIAGNOSIS — R632 Polyphagia: Secondary | ICD-10-CM

## 2022-11-25 MED ORDER — LISDEXAMFETAMINE DIMESYLATE 50 MG PO CAPS: 50.0000 mg | ORAL_CAPSULE | Freq: Every day | ORAL | 0 refills | Status: DC

## 2022-12-10 ENCOUNTER — Other Ambulatory Visit: Payer: Self-pay | Admitting: Family Medicine

## 2022-12-10 NOTE — Progress Notes (Unsigned)
I,J'ya E Hunter,acting as a scribe for Gwyneth Sprout, FNP.,have documented all relevant documentation on the behalf of Gwyneth Sprout, FNP,as directed by  Gwyneth Sprout, FNP while in the presence of Gwyneth Sprout, FNP.  Established patient visit  Patient: Krystal Simpson   DOB: 10-Jan-1961   62 y.o. Female  MRN: [id number] Visit Date: 12/11/2022  Today's healthcare provider: Gwyneth Sprout, FNP  Re Introduced to nurse practitioner role and practice setting.  All questions answered.  Discussed provider/patient relationship and expectations.  Unable to get coverage at this time; OK for payment out of pocket if patient wishes.  Patient wants to discuss the possibility of refilling, Semiglutin-Weight Management solution medication to insurance  Chief Complaint  Patient presents with   Rash    Facial rash   Subjective    Rash   HPI     Rash    Additional comments: Facial rash      Last edited by Babette Relic, CMA on 12/11/2022  1:33 PM.    Rash: Patient complains of rash involving the face. Rash started 3 days ago. Appearance of rash at onset: Color of lesion(s): pink, Texture of lesion(s): blistering, Size of lesion(s): 1 mm - 30mm . Rash has not changed over time Initial distribution: face.  Discomfort associated with rash: causes no discomfort.  Associated symptoms: headache. Denies: headache. Patient has not had previous evaluation of rash. Patient has not had previous treatment.  Response to treatment: n/a. Patient has not had contacts with similar rash. Patient has not identified precipitant. Patient has not had new exposures (soaps, lotions, laundry detergents, foods, medications, plants, insects or animals.)  Patient states that rash started off as a "single pimple" two weeks ago, states that there is no itching or burning. A cluster formed 3 days ago. For treatment patient says she has been using OTC clear antibiotic topical and hydrocortisone cream.   Medications: Outpatient  Medications Prior to Visit  Medication Sig   AIMOVIG 140 MG/ML SOAJ INJECT 140 MG INTO THE SKIN EVERY 30 DAYS   Cholecalciferol 1000 UNITS capsule Take 1,000 Units by mouth daily.    Erenumab-aooe (AIMOVIG) 140 MG/ML SOAJ Inject into the skin.   Erenumab-aooe 140 MG/ML SOAJ Inject into the skin.   ketoconazole (NIZORAL) 2 % shampoo PLEASE SEE ATTACHED FOR DETAILED DIRECTIONS   Multiple Vitamin (MULTI-VITAMIN) tablet Take 1 tablet by mouth daily.   MULTIPLE VITAMINS-MINERALS PO Take 1 tablet by mouth daily.    nystatin cream (MYCOSTATIN) Apply 1 application. topically 2 (two) times daily.   Omega-3 Fatty Acids (FISH OIL) 1000 MG CAPS Take 1 capsule by mouth daily.   OVER THE COUNTER MEDICATION Numbing cream for neck pain as needed   Polyethylene Glycol 3350 (MIRALAX PO) Take 17 g by mouth daily.    rOPINIRole (REQUIP) 0.25 MG tablet TAKE 1 TABLET BY MOUTH AT BEDTIME.   Semaglutide-Weight Management 0.25 MG/0.5ML SOAJ Inject 0.25 mg into the skin once a week.   sertraline (ZOLOFT) 100 MG tablet TAKE 2 TABLETS BY MOUTH EVERY DAY   SUMAtriptan (IMITREX) 100 MG tablet TAKE 1 TAB BY MOUTH ONCE AT HEADACHE ONSET, MAY REPEAT AFTER 2 HRS, MAX 2 TABS/DAY, 4 TABS/WEEK   traZODone (DESYREL) 50 MG tablet Take 1-2 tablets (50-100 mg total) by mouth at bedtime as needed.   Vitamin D, Ergocalciferol, (DRISDOL) 1.25 MG (50000 UNIT) CAPS capsule Take 1 capsule (50,000 Units total) by mouth every 7 (seven) days.   [DISCONTINUED]  lisdexamfetamine (VYVANSE) 50 MG capsule Take 1 capsule (50 mg total) by mouth daily.   [DISCONTINUED] Flaxseed, Linseed, (FLAX SEEDS PO) Take 1 capsule by mouth daily. (Patient not taking: Reported on 12/11/2022)   [DISCONTINUED] HYDROcodone-acetaminophen (NORCO) 10-325 MG tablet Take by mouth. (Patient not taking: Reported on 10/21/2022)   [DISCONTINUED] pantoprazole (PROTONIX) 40 MG tablet Take 40 mg by mouth daily. (Patient not taking: Reported on 10/21/2022)   No facility-administered  medications prior to visit.    Review of Systems  Skin:  Positive for rash.  Neurological:  Positive for headaches. Negative for seizures.     Objective    BP 135/67 (BP Location: Left Arm, Patient Position: Sitting, Cuff Size: Normal)   Pulse (!) 113   Temp 98.1 F (36.7 C) (Oral)   Ht 5' 7.72" (1.72 m)   Wt 187 lb 9.6 oz (85.1 kg)   LMP  (LMP Unknown)   BMI 28.76 kg/m   Physical Exam Vitals and nursing note reviewed.  Constitutional:      General: She is not in acute distress.    Appearance: Normal appearance. She is overweight. She is not ill-appearing, toxic-appearing or diaphoretic.  HENT:     Head: Normocephalic and atraumatic.  Cardiovascular:     Rate and Rhythm: Normal rate and regular rhythm.     Pulses: Normal pulses.     Heart sounds: Normal heart sounds. No murmur heard.    No friction rub. No gallop.  Pulmonary:     Effort: Pulmonary effort is normal. No respiratory distress.     Breath sounds: Normal breath sounds. No stridor. No wheezing, rhonchi or rales.  Chest:     Chest wall: No tenderness.  Musculoskeletal:        General: No swelling, tenderness, deformity or signs of injury. Normal range of motion.     Right lower leg: No edema.     Left lower leg: No edema.  Skin:    General: Skin is warm and dry.     Capillary Refill: Capillary refill takes less than 2 seconds.     Coloration: Skin is not jaundiced or pale.     Findings: Lesion and rash present. No bruising or erythema.       Neurological:     General: No focal deficit present.     Mental Status: She is alert and oriented to person, place, and time. Mental status is at baseline.     Cranial Nerves: No cranial nerve deficit.     Sensory: No sensory deficit.     Motor: No weakness.     Coordination: Coordination normal.  Psychiatric:        Mood and Affect: Mood normal.        Behavior: Behavior normal.        Thought Content: Thought content normal.        Judgment: Judgment normal.      No results found for any visits on 12/11/22.  Assessment & Plan     Problem List Items Addressed This Visit       Other   Binge eating    Chronic, variable Weight gain noted Refills for 12/2022 Continue to recommend balanced, lower carb meals. Smaller meal size, adding snacks. Choosing water as drink of choice and increasing purposeful exercise.       Relevant Medications   lisdexamfetamine (VYVANSE) 50 MG capsule   Shingles - Primary    Acute, self limiting V2 R dermatone Rx for antiviral Continue OTC NSAIDs  and APAP Avoid contact with very young/old; or patients without hx of Chicken Pox/Shingles or immunosuppression RTC as needed       Relevant Medications   valACYclovir (VALTREX) 1000 MG tablet   Return if symptoms worsen or fail to improve.     Vonna Kotyk, FNP, have reviewed all documentation for this visit. The documentation on 12/11/22 for the exam, diagnosis, procedures, and orders are all accurate and complete.  Gwyneth Sprout, [place] [phone] (phone) [phone] (fax)  [place]

## 2022-12-11 ENCOUNTER — Ambulatory Visit (INDEPENDENT_AMBULATORY_CARE_PROVIDER_SITE_OTHER): Payer: Medicare HMO | Admitting: Family Medicine

## 2022-12-11 ENCOUNTER — Encounter: Payer: Self-pay | Admitting: Family Medicine

## 2022-12-11 VITALS — BP 135/67 | HR 113 | Temp 98.1°F | Ht 67.72 in | Wt 187.6 lb

## 2022-12-11 DIAGNOSIS — R632 Polyphagia: Secondary | ICD-10-CM

## 2022-12-11 DIAGNOSIS — B028 Zoster with other complications: Secondary | ICD-10-CM

## 2022-12-11 DIAGNOSIS — B029 Zoster without complications: Secondary | ICD-10-CM | POA: Insufficient documentation

## 2022-12-11 MED ORDER — LISDEXAMFETAMINE DIMESYLATE 50 MG PO CAPS: 50.0000 mg | ORAL_CAPSULE | Freq: Every day | ORAL | 0 refills | Status: DC

## 2022-12-11 MED ORDER — VALACYCLOVIR HCL 1 G PO TABS: 1000.0000 mg | ORAL_TABLET | Freq: Three times a day (TID) | ORAL | 0 refills | Status: AC

## 2022-12-11 NOTE — Assessment & Plan Note (Signed)
Acute, self limiting V2 R dermatone Rx for antiviral Continue OTC NSAIDs and APAP Avoid contact with very young/old; or patients without hx of Chicken Pox/Shingles or immunosuppression RTC as needed

## 2022-12-11 NOTE — Assessment & Plan Note (Signed)
Chronic, variable Weight gain noted Refills for 12/2022 Continue to recommend balanced, lower carb meals. Smaller meal size, adding snacks. Choosing water as drink of choice and increasing purposeful exercise.

## 2022-12-26 ENCOUNTER — Other Ambulatory Visit: Payer: Self-pay | Admitting: Family Medicine

## 2022-12-26 DIAGNOSIS — R632 Polyphagia: Secondary | ICD-10-CM

## 2022-12-27 ENCOUNTER — Other Ambulatory Visit: Payer: Self-pay | Admitting: Family Medicine

## 2022-12-27 ENCOUNTER — Encounter: Payer: Self-pay | Admitting: Family Medicine

## 2022-12-27 DIAGNOSIS — R632 Polyphagia: Secondary | ICD-10-CM

## 2022-12-27 MED ORDER — LISDEXAMFETAMINE DIMESYLATE 50 MG PO CAPS: 50.0000 mg | ORAL_CAPSULE | Freq: Every day | ORAL | 0 refills | Status: DC

## 2023-01-06 ENCOUNTER — Ambulatory Visit (INDEPENDENT_AMBULATORY_CARE_PROVIDER_SITE_OTHER): Payer: Medicare HMO

## 2023-01-06 VITALS — Ht 64.5 in | Wt 183.0 lb

## 2023-01-06 DIAGNOSIS — Z0000 Encounter for general adult medical examination without abnormal findings: Secondary | ICD-10-CM

## 2023-01-06 NOTE — Progress Notes (Signed)
I connected with  Hubbard Robinson on 01/06/23 by a audio enabled telemedicine application and verified that I am speaking with the correct person using two identifiers.  Patient Location: Home  Provider Location: Office/Clinic  I discussed the limitations of evaluation and management by telemedicine. The patient expressed understanding and agreed to proceed.  Subjective:   Krystal Simpson is a 62 y.o. female who presents for Medicare Annual (Subsequent) preventive examination.  Review of Systems    Cardiac Risk Factors include: dyslipidemia;sedentary lifestyle;obesity (BMI >30kg/m2)    Objective:    Today's Vitals   01/06/23 1303 01/06/23 1305  Weight: 183 lb (83 kg)   Height: 5' 4.5" (1.638 m)   PainSc:  9    Body mass index is 30.93 kg/m.     01/06/2023    1:17 PM 01/01/2022    1:50 PM 10/25/2020    2:41 PM 12/02/2019    1:09 AM 10/21/2019   10:55 AM 10/16/2018   10:33 AM 10/16/2018   10:16 AM  Advanced Directives  Does Patient Have a Medical Advance Directive? No No No No No No No  Does patient want to make changes to medical advance directive?      Yes (MAU/Ambulatory/Procedural Areas - Information given) Yes (MAU/Ambulatory/Procedural Areas - Information given)  Would patient like information on creating a medical advance directive?  No - Patient declined No - Patient declined  No - Patient declined  No - Patient declined    Current Medications (verified) Outpatient Encounter Medications as of 01/06/2023  Medication Sig   AIMOVIG 140 MG/ML SOAJ INJECT 140 MG INTO THE SKIN EVERY 30 DAYS   Cholecalciferol 1000 UNITS capsule Take 1,000 Units by mouth daily.    Erenumab-aooe (AIMOVIG) 140 MG/ML SOAJ Inject into the skin.   Erenumab-aooe 140 MG/ML SOAJ Inject into the skin.   ketoconazole (NIZORAL) 2 % shampoo PLEASE SEE ATTACHED FOR DETAILED DIRECTIONS   lisdexamfetamine (VYVANSE) 50 MG capsule Take 1 capsule (50 mg total) by mouth daily.   Multiple Vitamin (MULTI-VITAMIN)  tablet Take 1 tablet by mouth daily.   MULTIPLE VITAMINS-MINERALS PO Take 1 tablet by mouth daily.    nystatin cream (MYCOSTATIN) Apply 1 application. topically 2 (two) times daily.   Omega-3 Fatty Acids (FISH OIL) 1000 MG CAPS Take 1 capsule by mouth daily.   OVER THE COUNTER MEDICATION Numbing cream for neck pain as needed   Polyethylene Glycol 3350 (MIRALAX PO) Take 17 g by mouth daily.    rOPINIRole (REQUIP) 0.25 MG tablet TAKE 1 TABLET BY MOUTH AT BEDTIME.   Semaglutide-Weight Management 0.25 MG/0.5ML SOAJ Inject 0.25 mg into the skin once a week.   sertraline (ZOLOFT) 100 MG tablet TAKE 2 TABLETS BY MOUTH EVERY DAY   SUMAtriptan (IMITREX) 100 MG tablet TAKE 1 TAB BY MOUTH ONCE AT HEADACHE ONSET, MAY REPEAT AFTER 2 HRS, MAX 2 TABS/DAY, 4 TABS/WEEK   traZODone (DESYREL) 50 MG tablet Take 1-2 tablets (50-100 mg total) by mouth at bedtime as needed.   Vitamin D, Ergocalciferol, (DRISDOL) 1.25 MG (50000 UNIT) CAPS capsule Take 1 capsule (50,000 Units total) by mouth every 7 (seven) days.   No facility-administered encounter medications on file as of 01/06/2023.    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 Adolphus Birchwood, left mid forearm   MOHS SURGERY  2015   top of head Dr. Cheryll Dessert at [facility] CELL CARCINOMA EXCISION  2014   Family History  Problem Relation Age of Onset   Emphysema Mother    Lung cancer Father    Healthy Sister    Heart attack Sister 60   Heart attack Maternal Grandmother    Breast cancer Sister    Social History   Socioeconomic History    Marital status: Married    Spouse name: Phil   Number of children: 2   Years of education: some college   Highest education level: Some college, no degree  Occupational History   Occupation: disabled  Tobacco Use   Smoking status: Never   Smokeless tobacco: Never  Vaping Use   Vaping Use: Never used  Substance and Sexual Activity   Alcohol use: No    Alcohol/week: 0.0 standard drinks of alcohol   Drug use: No   Sexual activity: Not Currently  Other Topics Concern   Not on file  Social History Narrative   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    Social Determinants of Health   Financial Resource Strain: Low Risk  (01/06/2023)   Overall Financial Resource Strain (CARDIA)    Difficulty of Paying Living Expenses: Not hard at all  Food Insecurity: No Food Insecurity (01/06/2023)   Hunger Vital Sign    Worried About Running Out of Food in the Last Year: Never true    Ran Out of Food in the Last Year: Never true  Transportation Needs: No Transportation Needs (01/06/2023)   PRAPARE - Administrator, Civil Service (Medical): No    Lack of Transportation (Non-Medical): No  Physical Activity: Inactive (01/06/2023)   Exercise Vital Sign    Days of Exercise per Week: 0 days    Minutes of Exercise per Session: 0 min  Stress: No Stress Concern Present (01/06/2023)   [employer] of Occupational Health - Occupational Stress Questionnaire    Feeling of Stress : Only a little  Social Connections: Unknown (01/06/2023)   Social Connection and Isolation Panel [NHANES]    Frequency of Communication with Friends and Family: Not on file    Frequency of Social Gatherings with Friends and Family: Once a week    Attends Religious Services: Never    Database administrator or Organizations: No    Attends Engineer, structural: Never    Marital Status: Married    Tobacco Counseling Counseling given: Not Answered   Clinical Intake:  Pre-visit preparation completed:  Yes  Pain : 0-10 Pain Score: 9  Pain Type: Chronic pain Pain Location: Back Pain Orientation: Lower Pain Descriptors / Indicators: Aching Pain Onset: More than a month ago Pain Frequency: Constant Pain Relieving Factors: lying down only thing that eases, IBU and icy hot (help a little)  Pain Relieving Factors: lying down only thing that eases, IBU and icy hot (help a little)  BMI - recorded: 30.93 Nutritional Status: BMI > 30  Obese Nutritional Risks: None Diabetes: No   Diabetic?no  Interpreter Needed?: No  Comments: livesw w/husband Information entered by :: B.Afton Lavalle,LPN   Activities of Daily Living    01/06/2023    1:17 PM  12/11/2022    1:57 PM  In your present state of health, do you have any difficulty performing the following activities:  Hearing? 0 0  Vision? 0 0  Difficulty concentrating or making decisions? 0 0  Walking or climbing stairs? 1 0  Comment with back pain sometimes   Dressing or bathing? 0 0  Doing errands, shopping? 0 0  Preparing Food and eating ? N   Using the Toilet? N   In the past six months, have you accidently leaked urine? Y   Comment sometimes   Do you have problems with loss of bowel control? N   Managing your Medications? N   Managing your Finances? N   Housekeeping or managing your Housekeeping? N     Patient Care Team: Jacky Kindle, FNP as PCP - General (Family Medicine) Eli Phillips, OD as Consulting Physician (Optometry) Lorn Junes, Campbell Riches, MD as Referring Physician (Dermatology) Debbrah Alar, MD as Consulting Physician (Dermatology) Darliss Ridgel, MD as Referring Physician (Psychiatry) Merri Ray, MD as Referring Physician (Physical Medicine and Rehabilitation) Meeler, Jodelle Gross, FNP (Family Medicine) Morene Crocker, MD as Referring Physician (Neurology) Naoma Diener, NP as Nurse Practitioner (Neurology) Bud Face, MD as Referring Physician (Otolaryngology)  Indicate any recent  Medical Services you may have received from other than [facility] providers in the past year (date may be approximate).     Assessment:   This is a routine wellness examination for Zira.  Hearing/Vision screen Hearing Screening - Comments:: Adequate hearing Vision Screening - Comments:: Adequate vision w/glasses [facility]  Dietary issues and exercise activities discussed: Current Exercise Habits: The patient does not participate in regular exercise at present, Exercise limited by: orthopedic condition(s);Other - see comments (chronic fatigue syndrome)   Goals Addressed             This Visit's Progress    DIET - EAT MORE FRUITS AND VEGETABLES   On track    Exercise 150 minutes per week (moderate activity)   Not on track    10/06/17- Pt to increase the amount of exercise done. Pt to start walking for 3-4 days a week for at least 30 minutes at a time.        Depression Screen    01/06/2023    1:14 PM 12/11/2022    1:56 PM 10/21/2022   11:29 AM 07/10/2022    1:25 PM 01/24/2022   10:24 AM 01/01/2022    1:49 PM 06/19/2021    8:58 AM  PHQ 2/9 Scores  PHQ - 2 Score 0 2 0 1  PHQ- 9 Score Fall Risk    01/06/2023    1:08 PM 12/11/2022    1:56 PM 10/21/2022   11:29 AM 07/10/2022    1:25 PM 01/24/2022   10:23 AM  Fall Risk   Falls in the past year? 0 0  Number falls in past yr: 0 0 1 0 0  Injury with Fall? 0 0 0 0 0  Risk for fall due to : No Fall Risks Medication side effect;History of fall(s)  No Fall Risks   Follow up Education provided;Falls prevention discussed   Falls evaluation completed     FALL RISK PREVENTION PERTAINING TO THE HOME:  Any stairs in or around the home? No  If so, are there any without handrails? No  Home free of loose throw rugs in walkways, pet beds,  electrical cords, etc? Yes  Adequate lighting in your home to reduce risk of falls? yes  ASSISTIVE DEVICES UTILIZED TO PREVENT FALLS:  Life alert? No  Use of a cane,  walker or w/c? No  Grab bars in the bathroom? Yes  Shower chair or bench in shower? No  Elevated toilet seat or a handicapped toilet? No   Cognitive Function:        01/06/2023    1:21 PM 10/21/2019   11:02 AM 10/06/2017    2:37 PM  6CIT Screen  What Year? 0 points 0 points 0 points  What month? 0 points 0 points 0 points  What time? 0 points 0 points 0 points  Count back from 20 0 points 0 points 0 points  Months in reverse 0 points 0 points 0 points  Repeat phrase 0 points 0 points 2 points  Total Score 0 points 0 points 2 points    Immunizations Immunization History  Administered Date(s) Administered   Influenza, Seasonal, Injecte, Preservative Fre 06/24/2015   Influenza,inj,Quad PF,6+ Mos 10/04/2016, 06/02/2017, 06/04/2018, 06/08/2019, 06/19/2021   Moderna Sars-Covid-2 Vaccination 02/01/2020, 03/02/2020, 09/21/2020   Zoster Recombinat (Shingrix) 06/08/2019    TDAP status: Up to date  Flu Vaccine status: Up to date  Pneumococcal vaccine status: Due, Education has been provided regarding the importance of this vaccine. Advised may receive this vaccine at local pharmacy or Health Dept. Aware to provide a copy of the vaccination record if obtained from local pharmacy or Health Dept. Verbalized acceptance and understanding.  Covid-19 vaccine status: Completed vaccines  Qualifies for Shingles Vaccine? Yes   Zostavax completed Yes   Shingrix Completed?: Yes  Screening Tests Health Maintenance  Topic Date Due   DTaP/Tdap/Td (1 - Tdap) Never done   Zoster Vaccines- Shingrix (2 of 2) 08/03/2019   COVID-19 Vaccine (4 - 2023-24 season) 05/24/2022   INFLUENZA VACCINE  04/24/2023   COLONOSCOPY (Pts 45-40yrs Insurance coverage will need to be confirmed)  08/17/2023   Medicare Annual Wellness (AWV)  01/06/2024   MAMMOGRAM  07/25/2024   PAP SMEAR-Modifier  01/24/2025   Hepatitis C Screening  Completed   HIV Screening  Completed   HPV VACCINES  Aged Out    Health  Maintenance  Health Maintenance Due  Topic Date Due   DTaP/Tdap/Td (1 - Tdap) Never done   Zoster Vaccines- Shingrix (2 of 2) 08/03/2019   COVID-19 Vaccine (4 - 2023-24 season) 05/24/2022    Colorectal cancer screening: Type of screening: Colonoscopy. Completed yes. Repeat every 10 years  Mammogram status: Completed yes. Repeat every year  Lung Cancer Screening: (Low Dose CT Chest recommended if Age 52-80 years, 30 pack-year currently smoking OR have quit w/in 15years.) does not qualify.   Lung Cancer Screening Referral: no  Additional Screening:  Hepatitis C Screening: does not qualify; Completed yes  Vision Screening: Recommended annual ophthalmology exams for early detection of glaucoma and other disorders of the eye. Is the patient up to date with their annual eye exam?  Yes  Who is the provider or what is the name of the office in which the patient attends annual eye exams? [facility] If pt is not established with a provider, would they like to be referred to a provider to establish care? No .   Dental Screening: Recommended annual dental exams for proper oral hygiene  Community Resource Referral / Chronic Care Management: CRR required this visit?  No   CCM required this visit?  No    Plan:  I have personally reviewed and noted the following in the patient's chart:   Medical and social history Use of alcohol, tobacco or illicit drugs  Current medications and supplements including opioid prescriptions. Patient is not currently taking opioid prescriptions. Functional ability and status Nutritional status Physical activity Advanced directives List of other physicians Hospitalizations, surgeries, and ER visits in previous 12 months Vitals Screenings to include cognitive, depression, and falls Referrals and appointments  In addition, I have reviewed and discussed with patient certain preventive protocols, quality metrics, and best practice recommendations. A  written personalized care plan for preventive services as well as general preventive health recommendations were provided to patient.    Sue Lush, LPN   [id number]   Nurse Notes: pt states she is managing lots of LBP today as she has been moving about and watching her grandchild. She relays nothing helps too much other than to rest the back. Pt has no other concerns or questions at this time.

## 2023-01-06 NOTE — Patient Instructions (Signed)
[name] , Thank you for taking time to come for your Medicare Wellness Visit. I appreciate your ongoing commitment to your health goals. Please review the following plan we discussed and let me know if I can assist you in the future.   These are the goals we discussed:  Goals      DIET - EAT MORE FRUITS AND VEGETABLES     Exercise 150 minutes per week (moderate activity)     10/06/17- Pt to increase the amount of exercise done. Pt to start walking for 3-4 days a week for at least 30 minutes at a time.         This is a list of the screening recommended for you and due dates:  Health Maintenance  Topic Date Due   DTaP/Tdap/Td vaccine (1 - Tdap) Never done   Zoster (Shingles) Vaccine (2 of 2) 08/03/2019   COVID-19 Vaccine (4 - 2023-24 season) 05/24/2022   Flu Shot  04/24/2023   Colon Cancer Screening  08/17/2023   Medicare Annual Wellness Visit  01/06/2024   Mammogram  07/25/2024   Pap Smear  01/24/2025   Hepatitis C Screening: USPSTF Recommendation to screen - Ages 18-79 yo.  Completed   HIV Screening  Completed   HPV Vaccine  Aged Out    Advanced directives: no  Conditions/risks identified: low falls risk  Next appointment: Follow up in one year for your annual wellness visit. 01/07/2024  telephone  Preventive Care 40-64 Years, Female Preventive care refers to lifestyle choices and visits with your health care provider that can promote health and wellness. What does preventive care include? A yearly physical exam. This is also called an annual well check. Dental exams once or twice a year. Routine eye exams. Ask your health care provider how often you should have your eyes checked. Personal lifestyle choices, including: Daily care of your teeth and gums. Regular physical activity. Eating a healthy diet. Avoiding tobacco and drug use. Limiting alcohol use. Practicing safe sex. Taking low-dose aspirin daily starting at age 20. Taking vitamin and mineral  supplements as recommended by your health care provider. What happens during an annual well check? The services and screenings done by your health care provider during your annual well check will depend on your age, overall health, lifestyle risk factors, and family history of disease. Counseling  Your health care provider may ask you questions about your: Alcohol use. Tobacco use. Drug use. Emotional well-being. Home and relationship well-being. Sexual activity. Eating habits. Work and work Astronomer. Method of birth control. Menstrual cycle. Pregnancy history. Screening  You may have the following tests or measurements: Height, weight, and BMI. Blood pressure. Lipid and cholesterol levels. These may be checked every 5 years, or more frequently if you are over 66 years old. Skin check. Lung cancer screening. You may have this screening every year starting at age 75 if you have a 30-pack-year history of smoking and currently smoke or have quit within the past 15 years. Fecal occult blood test (FOBT) of the stool. You may have this test every year starting at age 40. Flexible sigmoidoscopy or colonoscopy. You may have a sigmoidoscopy every 5 years or a colonoscopy every 10 years starting at age 39. Hepatitis C blood test. Hepatitis B blood test. Sexually transmitted disease (STD) testing. Diabetes screening. This is done by checking your blood sugar (glucose) after you have not eaten for a while (fasting). You may have this done every 1-3 years. Mammogram. This may be done  every 1-2 years. Talk to your health care provider about when you should start having regular mammograms. This may depend on whether you have a family history of breast cancer. BRCA-related cancer screening. This may be done if you have a family history of breast, ovarian, tubal, or peritoneal cancers. Pelvic exam and Pap test. This may be done every 3 years starting at age 51. Starting at age 30, this may be done  every 5 years if you have a Pap test in combination with an HPV test. Bone density scan. This is done to screen for osteoporosis. You may have this scan if you are at high risk for osteoporosis. Discuss your test results, treatment options, and if necessary, the need for more tests with your health care provider. Vaccines  Your health care provider may recommend certain vaccines, such as: Influenza vaccine. This is recommended every year. Tetanus, diphtheria, and acellular pertussis (Tdap, Td) vaccine. You may need a Td booster every 10 years. Zoster vaccine. You may need this after age 55. Pneumococcal 13-valent conjugate (PCV13) vaccine. You may need this if you have certain conditions and were not previously vaccinated. Pneumococcal polysaccharide (PPSV23) vaccine. You may need one or two doses if you smoke cigarettes or if you have certain conditions. Talk to your health care provider about which screenings and vaccines you need and how often you need them. This information is not intended to replace advice given to you by your health care provider. Make sure you discuss any questions you have with your health care provider. Document Released: 10/06/2015 Document Revised: 05/29/2016 Document Reviewed: 07/11/2015 Elsevier Interactive Patient Education  2017 [employer].    Fall Prevention in the Home Falls can cause injuries. They can happen to people of all ages. There are many things you can do to make your home safe and to help prevent falls. What can I do on the outside of my home? Regularly fix the edges of walkways and driveways and fix any cracks. Remove anything that might make you trip as you walk through a door, such as a raised step or threshold. Trim any bushes or trees on the path to your home. Use bright outdoor lighting. Clear any walking paths of anything that might make someone trip, such as rocks or tools. Regularly check to see if handrails are loose or broken. Make  sure that both sides of any steps have handrails. Any raised decks and porches should have guardrails on the edges. Have any leaves, snow, or ice cleared regularly. Use sand or salt on walking paths during winter. Clean up any spills in your garage right away. This includes oil or grease spills. What can I do in the bathroom? Use night lights. Install grab bars by the toilet and in the tub and shower. Do not use towel bars as grab bars. Use non-skid mats or decals in the tub or shower. If you need to sit down in the shower, use a plastic, non-slip stool. Keep the floor dry. Clean up any water that spills on the floor as soon as it happens. Remove soap buildup in the tub or shower regularly. Attach bath mats securely with double-sided non-slip rug tape. Do not have throw rugs and other things on the floor that can make you trip. What can I do in the bedroom? Use night lights. Make sure that you have a light by your bed that is easy to reach. Do not use any sheets or blankets that are too big for your  bed. They should not hang down onto the floor. Have a firm chair that has side arms. You can use this for support while you get dressed. Do not have throw rugs and other things on the floor that can make you trip. What can I do in the kitchen? Clean up any spills right away. Avoid walking on wet floors. Keep items that you use a lot in easy-to-reach places. If you need to reach something above you, use a strong step stool that has a grab bar. Keep electrical cords out of the way. Do not use floor polish or wax that makes floors slippery. If you must use wax, use non-skid floor wax. Do not have throw rugs and other things on the floor that can make you trip. What can I do with my stairs? Do not leave any items on the stairs. Make sure that there are handrails on both sides of the stairs and use them. Fix handrails that are broken or loose. Make sure that handrails are as long as the  stairways. Check any carpeting to make sure that it is firmly attached to the stairs. Fix any carpet that is loose or worn. Avoid having throw rugs at the top or bottom of the stairs. If you do have throw rugs, attach them to the floor with carpet tape. Make sure that you have a light switch at the top of the stairs and the bottom of the stairs. If you do not have them, ask someone to add them for you. What else can I do to help prevent falls? Wear shoes that: Do not have high heels. Have rubber bottoms. Are comfortable and fit you well. Are closed at the toe. Do not wear sandals. If you use a stepladder: Make sure that it is fully opened. Do not climb a closed stepladder. Make sure that both sides of the stepladder are locked into place. Ask someone to hold it for you, if possible. Clearly mark and make sure that you can see: Any grab bars or handrails. First and last steps. Where the edge of each step is. Use tools that help you move around (mobility aids) if they are needed. These include: Canes. Walkers. Scooters. Crutches. Turn on the lights when you go into a dark area. Replace any light bulbs as soon as they burn out. Set up your furniture so you have a clear path. Avoid moving your furniture around. If any of your floors are uneven, fix them. If there are any pets around you, be aware of where they are. Review your medicines with your doctor. Some medicines can make you feel dizzy. This can increase your chance of falling. Ask your doctor what other things that you can do to help prevent falls. This information is not intended to replace advice given to you by your health care provider. Make sure you discuss any questions you have with your health care provider. Document Released: 07/06/2009 Document Revised: 02/15/2016 Document Reviewed: 10/14/2014 Elsevier Interactive Patient Education  2017 [employer].

## 2023-01-14 ENCOUNTER — Other Ambulatory Visit: Payer: Self-pay | Admitting: Family Medicine

## 2023-01-14 MED ORDER — VALACYCLOVIR HCL 1 G PO TABS: 1000.0000 mg | ORAL_TABLET | Freq: Three times a day (TID) | ORAL | 0 refills | Status: AC

## 2023-01-28 ENCOUNTER — Encounter: Payer: Medicare HMO | Admitting: Family Medicine

## 2023-01-31 ENCOUNTER — Other Ambulatory Visit: Payer: Self-pay | Admitting: Family Medicine

## 2023-01-31 DIAGNOSIS — R632 Polyphagia: Secondary | ICD-10-CM

## 2023-02-03 MED ORDER — LISDEXAMFETAMINE DIMESYLATE 50 MG PO CAPS: 50.0000 mg | ORAL_CAPSULE | Freq: Every day | ORAL | 0 refills | Status: DC

## 2023-02-04 ENCOUNTER — Encounter: Payer: Self-pay | Admitting: Family Medicine

## 2023-02-04 ENCOUNTER — Ambulatory Visit (INDEPENDENT_AMBULATORY_CARE_PROVIDER_SITE_OTHER): Payer: Medicare HMO | Admitting: Family Medicine

## 2023-02-04 VITALS — BP 122/78 | HR 79 | Ht 64.5 in | Wt 189.0 lb

## 2023-02-04 DIAGNOSIS — F339 Major depressive disorder, recurrent, unspecified: Secondary | ICD-10-CM | POA: Diagnosis not present

## 2023-02-04 DIAGNOSIS — B351 Tinea unguium: Secondary | ICD-10-CM

## 2023-02-04 DIAGNOSIS — R7309 Other abnormal glucose: Secondary | ICD-10-CM | POA: Diagnosis not present

## 2023-02-04 DIAGNOSIS — Z0000 Encounter for general adult medical examination without abnormal findings: Secondary | ICD-10-CM

## 2023-02-04 DIAGNOSIS — F411 Generalized anxiety disorder: Secondary | ICD-10-CM

## 2023-02-04 DIAGNOSIS — E559 Vitamin D deficiency, unspecified: Secondary | ICD-10-CM | POA: Diagnosis not present

## 2023-02-04 DIAGNOSIS — L439 Lichen planus, unspecified: Secondary | ICD-10-CM

## 2023-02-04 DIAGNOSIS — G2581 Restless legs syndrome: Secondary | ICD-10-CM

## 2023-02-04 DIAGNOSIS — E7800 Pure hypercholesterolemia, unspecified: Secondary | ICD-10-CM | POA: Diagnosis not present

## 2023-02-04 DIAGNOSIS — Z1211 Encounter for screening for malignant neoplasm of colon: Secondary | ICD-10-CM

## 2023-02-04 DIAGNOSIS — Z1231 Encounter for screening mammogram for malignant neoplasm of breast: Secondary | ICD-10-CM

## 2023-02-04 MED ORDER — NYSTATIN-TRIAMCINOLONE 100000-0.1 UNIT/GM-% EX OINT
1.0000 | TOPICAL_OINTMENT | Freq: Two times a day (BID) | CUTANEOUS | 0 refills | Status: DC
Start: 2023-02-04 — End: 2023-09-26

## 2023-02-04 MED ORDER — GABAPENTIN 300 MG PO CAPS: 300.0000 mg | ORAL_CAPSULE | Freq: Every day | ORAL | 5 refills | Status: DC

## 2023-02-04 NOTE — Assessment & Plan Note (Signed)
Chronic, stable PHQ reviewed Declines use of zoloft to assist; previously prescribed 200 mg daily which patient has weaned herself off of Continue to monitor for exacerbations of s/s

## 2023-02-04 NOTE — Assessment & Plan Note (Signed)
Chronic, currently exacerbated at night only Request to increase gaba from 200 to 300 mg Continue to monitor s/s

## 2023-02-04 NOTE — Assessment & Plan Note (Signed)
Due for mammo and colonoscopy later this fall UTD on dental and vision Things to do to keep yourself healthy  - Exercise at least 30-45 minutes a day, 3-4 days a week.  - Eat a low-fat diet with lots of fruits and vegetables, up to 7-9 servings per day.  - Seatbelts can save your life. Wear them always.  - Smoke detectors on every level of your home, check batteries every year.  - Eye Doctor - have an eye exam every 1-2 years  - Safe sex - if you may be exposed to STDs, use a condom.  - Alcohol -  If you drink, do it moderately, less than 2 drinks per day.  - Health Care Power of Attorney. Choose someone to speak for you if you are not able.  - Depression is common in our stressful world.If you're feeling down or losing interest in things you normally enjoy, please come in for a visit.  - Violence - If anyone is threatening or hurting you, please call immediately.

## 2023-02-04 NOTE — Assessment & Plan Note (Signed)
Due this fall Due for screening for mammogram, denies breast concerns, provided with phone number to call and schedule appointment for mammogram. Encouraged to repeat breast cancer screening every 1-2 years.

## 2023-02-04 NOTE — Assessment & Plan Note (Signed)
R great toe; referral to podiatry to assist Previously noted on L great toe with improvement s/p nail avulsion

## 2023-02-04 NOTE — Assessment & Plan Note (Signed)
Noted on previous labs; repeat A1c Continue to recommend balanced, lower carb meals. Smaller meal size, adding snacks. Choosing water as drink of choice and increasing purposeful exercise.

## 2023-02-04 NOTE — Assessment & Plan Note (Signed)
Chronic, untreated Repeat LP recommend diet low in saturated fat and regular exercise - 30 min at least 5 times per week  The 10-year ASCVD risk score (Arnett DK, et al., 2019) is: 3.3%   Values used to calculate the score:     Age: 62 years     Sex: Female     Is Non-Hispanic African American: No     Diabetic: No     Tobacco smoker: No     Systolic Blood Pressure: 122 mmHg     Is BP treated: No     HDL Cholesterol: 63 mg/dL     Total Cholesterol: 193 mg/dL

## 2023-02-04 NOTE — Assessment & Plan Note (Signed)
Acute; not improved with topical antifungals alone Recommend combo antifungal/steroid to assist RTC as needed

## 2023-02-04 NOTE — Assessment & Plan Note (Signed)
10 year follow up due this fall

## 2023-02-04 NOTE — Assessment & Plan Note (Signed)
Chronic, previously on supplementation Repeat labs in setting of moderate, recurrent depression

## 2023-02-04 NOTE — Assessment & Plan Note (Signed)
Chronic, currently exacerbated at night only Request to increase gaba from 200 to 300 mg; previously trial of requip- non adherent  Continue to monitor s/s

## 2023-02-04 NOTE — Patient Instructions (Signed)
The CDC recommends two doses of Shingrix (the new shingles vaccine) separated by 2 to 6 months for adults age 63 years and older. I recommend checking with your insurance plan regarding coverage for this vaccine.    Please call and schedule your mammogram:  [facility]  [address], [address] [facility] [place],  [place]  [address] Get Driving Directions Main: [phone]  Sunday:Closed Monday:7:20 AM - 5:00 PM Tuesday:7:20 AM - 5:00 PM Wednesday:7:20 AM - 5:00 PM Thursday:7:20 AM - 5:00 PM Friday:7:20 AM - 4:30 PM Saturday:Closed  

## 2023-02-04 NOTE — Progress Notes (Signed)
Complete physical exam   Patient: Krystal Simpson   DOB: [birth year]   62 y.o. Female  MRN: [id number] Visit Date: 02/04/2023  Today's healthcare provider: Jacky Kindle, FNP   Chief Complaint  Patient presents with   Annual Exam   Subjective    Krystal Simpson is a 62 y.o. female who presents today for a complete physical exam.  She reports consuming a general diet. The patient does not participate in regular exercise at present. She generally feels fairly well. She reports sleeping fairly well. She does have additional problems to discuss today.  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 Adolphus Birchwood, left mid forearm   MOHS SURGERY  2015   top of head Dr. Cheryll Dessert at [facility] CELL CARCINOMA EXCISION  2014   Social History   Socioeconomic History   Marital status: Married    Spouse name: Phil   Number of children: 2   Years of education: some college   Highest education level: Some college, no degree  Occupational History   Occupation: disabled  Tobacco Use   Smoking status: Never   Smokeless tobacco: Never  Vaping Use   Vaping Use: Never used  Substance and Sexual Activity   Alcohol use: No    Alcohol/week: 0.0 standard drinks of alcohol   Drug use: No   Sexual activity: Not Currently  Other Topics Concern   Not on file  Social History Narrative   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    Social Determinants of Health   Financial Resource Strain: Low Risk  (01/06/2023)   Overall Financial Resource Strain (CARDIA)    Difficulty of Paying Living Expenses: Not hard at all  Food Insecurity: No Food Insecurity (01/06/2023)   Hunger Vital Sign     Worried About Running Out of Food in the Last Year: Never true    Ran Out of Food in the Last Year: Never true  Transportation Needs: No Transportation Needs (01/06/2023)   PRAPARE - Administrator, Civil Service (Medical): No    Lack of Transportation (Non-Medical): No  Physical Activity: Inactive (01/06/2023)   Exercise Vital Sign    Days of Exercise per Week: 0 days    Minutes of Exercise per Session: 0 min  Stress: No Stress Concern Present (01/06/2023)   [employer] of Occupational Health - Occupational Stress Questionnaire    Feeling of Stress : Only a little  Social Connections: Unknown (01/06/2023)   Social Connection and Isolation Panel [NHANES]    Frequency of Communication with Friends and Family: Not on file    Frequency of Social Gatherings with Friends and Family: Once a week    Attends Religious Services: Never    Database administrator or Organizations: No    Attends Banker Meetings: Never    Marital Status: Married  Catering manager Violence: Not At Risk (01/06/2023)   Humiliation, Afraid, Rape, and Kick questionnaire    Fear of Current or Ex-Partner: No    Emotionally Abused: No    Physically  Abused: No    Sexually Abused: No   Family Status  Relation Name Status   Mother  Deceased at age 14   Father  Deceased at age 69   Sister  Alive   MGM  Deceased at age 57       MI   Sister HALF SISTER Alive   Family History  Problem Relation Age of Onset   Emphysema Mother    Lung cancer Father    Healthy Sister    Heart attack Sister 48   Heart attack Maternal Grandmother    Breast cancer Sister    Allergies  Allergen Reactions   Cipro [Ciprofloxacin-Ciproflox Hcl Er] Other (See Comments)   Ciprofloxacin     Other reaction(s): Joint Pains   Tetanus Toxoids Hives   Tetanus Toxoids Rash    Patient Care Team: Jacky Kindle, FNP as PCP - General (Family Medicine) Eli Phillips, OD as Consulting Physician (Optometry) Lorn Junes,  Campbell Riches, MD as Referring Physician (Dermatology) Debbrah Alar, MD as Consulting Physician (Dermatology) Darliss Ridgel, MD as Referring Physician (Psychiatry) Merri Ray, MD as Referring Physician (Physical Medicine and Rehabilitation) Meeler, Jodelle Gross, FNP (Family Medicine) Morene Crocker, MD as Referring Physician (Neurology) Naoma Diener, NP as Nurse Practitioner (Neurology) Bud Face, MD as Referring Physician (Otolaryngology)   Medications: Outpatient Medications Prior to Visit  Medication Sig   lisdexamfetamine (VYVANSE) 50 MG capsule Take 1 capsule (50 mg total) by mouth daily.   OVER THE COUNTER MEDICATION Numbing cream for neck pain as needed   rOPINIRole (REQUIP) 0.25 MG tablet TAKE 1 TABLET BY MOUTH AT BEDTIME.   SUMAtriptan (IMITREX) 100 MG tablet TAKE 1 TAB BY MOUTH ONCE AT HEADACHE ONSET, MAY REPEAT AFTER 2 HRS, MAX 2 TABS/DAY, 4 TABS/WEEK   [DISCONTINUED] AIMOVIG 140 MG/ML SOAJ INJECT 140 MG INTO THE SKIN EVERY 30 DAYS   [DISCONTINUED] Cholecalciferol 1000 UNITS capsule Take 1,000 Units by mouth daily.    [DISCONTINUED] Erenumab-aooe (AIMOVIG) 140 MG/ML SOAJ Inject into the skin.   [DISCONTINUED] Erenumab-aooe 140 MG/ML SOAJ Inject into the skin.   [DISCONTINUED] gabapentin (NEURONTIN) 100 MG capsule Take 200 mg by mouth at bedtime.   [DISCONTINUED] ketoconazole (NIZORAL) 2 % shampoo PLEASE SEE ATTACHED FOR DETAILED DIRECTIONS   [DISCONTINUED] Multiple Vitamin (MULTI-VITAMIN) tablet Take 1 tablet by mouth daily.   [DISCONTINUED] MULTIPLE VITAMINS-MINERALS PO Take 1 tablet by mouth daily.    [DISCONTINUED] nystatin cream (MYCOSTATIN) Apply 1 application. topically 2 (two) times daily.   [DISCONTINUED] Omega-3 Fatty Acids (FISH OIL) 1000 MG CAPS Take 1 capsule by mouth daily.   [DISCONTINUED] Polyethylene Glycol 3350 (MIRALAX PO) Take 17 g by mouth daily.    [DISCONTINUED] Semaglutide-Weight Management 0.25 MG/0.5ML SOAJ Inject 0.25 mg into the  skin once a week.   [DISCONTINUED] sertraline (ZOLOFT) 100 MG tablet TAKE 2 TABLETS BY MOUTH EVERY DAY   [DISCONTINUED] traZODone (DESYREL) 50 MG tablet Take 1-2 tablets (50-100 mg total) by mouth at bedtime as needed.   [DISCONTINUED] Vitamin D, Ergocalciferol, (DRISDOL) 1.25 MG (50000 UNIT) CAPS capsule Take 1 capsule (50,000 Units total) by mouth every 7 (seven) days.   No facility-administered medications prior to visit.    Review of Systems  Last CBC Lab Results  Component Value Date   WBC 7.2 10/21/2022   HGB 13.4 10/21/2022   HCT 39.9 10/21/2022   MCV 89 10/21/2022   MCH 29.7 10/21/2022   RDW 12.9 10/21/2022   PLT 247 10/21/2022   Last metabolic  panel Lab Results  Component Value Date   GLUCOSE 105 (H) 10/21/2022   NA 143 10/21/2022   K 3.7 10/21/2022   CL 107 (H) 10/21/2022   CO2 23 10/21/2022   BUN 15 10/21/2022   CREATININE 0.68 10/21/2022   EGFR 98 10/21/2022   CALCIUM 9.6 10/21/2022   PROT 6.1 10/21/2022   ALBUMIN 4.2 10/21/2022   LABGLOB 1.9 10/21/2022   AGRATIO 2.2 10/21/2022   BILITOT 0.5 10/21/2022   ALKPHOS 99 10/21/2022   AST 16 10/21/2022   ALT 13 10/21/2022   ANIONGAP 8 12/02/2019   Last lipids Lab Results  Component Value Date   CHOL 193 01/24/2022   HDL 63 01/24/2022   LDLCALC 120 (H) 01/24/2022   TRIG 52 01/24/2022   CHOLHDL 3.1 01/24/2022   Last hemoglobin A1c Lab Results  Component Value Date   HGBA1C 4.8 01/24/2022   Last thyroid functions Lab Results  Component Value Date   TSH 1.440 01/24/2022   T4TOTAL 7.5 12/15/2017   Last vitamin D Lab Results  Component Value Date   VD25OH 13.9 (L) 01/24/2022      Objective    BP 122/78 (BP Location: Left Arm, Patient Position: Sitting, Cuff Size: Normal)   Pulse 79   Ht 5' 4.5" (1.638 m)   Wt 189 lb (85.7 kg)   SpO2 98%   BMI 31.94 kg/m  BP Readings from Last 3 Encounters:  02/04/23 122/78  12/11/22 135/67  10/21/22 136/77   Wt Readings from Last 3 Encounters:   02/04/23 189 lb (85.7 kg)  01/06/23 183 lb (83 kg)  12/11/22 187 lb 9.6 oz (85.1 kg)   SpO2 Readings from Last 3 Encounters:  02/04/23 98%  10/21/22 98%  01/24/22 97%       Physical Exam Vitals and nursing note reviewed.  Constitutional:      General: She is awake. She is not in acute distress.    Appearance: Normal appearance. She is well-developed and well-groomed. She is obese. She is not ill-appearing, toxic-appearing or diaphoretic.  HENT:     Head: Normocephalic and atraumatic.     Jaw: There is normal jaw occlusion. No trismus, tenderness, swelling or pain on movement.     Right Ear: Hearing, tympanic membrane, ear canal and external ear normal. There is no impacted cerumen.     Left Ear: Hearing, tympanic membrane, ear canal and external ear normal. There is no impacted cerumen.     Nose: Nose normal. No congestion or rhinorrhea.     Right Turbinates: Not enlarged, swollen or pale.     Left Turbinates: Not enlarged, swollen or pale.     Right Sinus: No maxillary sinus tenderness or frontal sinus tenderness.     Left Sinus: No maxillary sinus tenderness or frontal sinus tenderness.     Mouth/Throat:     Lips: Pink.     Mouth: Mucous membranes are moist. No injury.     Tongue: No lesions.     Pharynx: Oropharynx is clear. Uvula midline. No pharyngeal swelling, oropharyngeal exudate, posterior oropharyngeal erythema or uvula swelling.     Tonsils: No tonsillar exudate or tonsillar abscesses.  Eyes:     General: Lids are normal. Lids are everted, no foreign bodies appreciated. Vision grossly intact. Gaze aligned appropriately. No allergic shiner or visual field deficit.       Right eye: No discharge.        Left eye: No discharge.     Extraocular Movements: Extraocular movements intact.  Conjunctiva/sclera: Conjunctivae normal.     Right eye: Right conjunctiva is not injected. No exudate.    Left eye: Left conjunctiva is not injected. No exudate.    Pupils: Pupils  are equal, round, and reactive to light.  Neck:     Thyroid: No thyroid mass, thyromegaly or thyroid tenderness.     Vascular: No carotid bruit.     Trachea: Trachea normal.  Cardiovascular:     Rate and Rhythm: Normal rate and regular rhythm.     Pulses: Normal pulses.          Carotid pulses are 2+ on the right side and 2+ on the left side.      Radial pulses are 2+ on the right side and 2+ on the left side.       Dorsalis pedis pulses are 2+ on the right side and 2+ on the left side.       Posterior tibial pulses are 2+ on the right side and 2+ on the left side.     Heart sounds: Normal heart sounds, S1 normal and S2 normal. No murmur heard.    No friction rub. No gallop.  Pulmonary:     Effort: Pulmonary effort is normal. No respiratory distress.     Breath sounds: Normal breath sounds and air entry. No stridor. No wheezing, rhonchi or rales.  Chest:     Chest wall: No tenderness.     Comments: Chronic L nipple inversion Breasts: breasts appear normal, no suspicious masses, no skin or nipple changes or axillary nodes, right breast normal without mass, skin or nipple changes or axillary nodes, left breast normal without mass, skin or nipple changes or axillary nodes, unchanged from previous exams, risk and benefit of breast self-exam was discussed  Abdominal:     General: Abdomen is flat. Bowel sounds are normal. There is no distension.     Palpations: Abdomen is soft. There is no mass.     Tenderness: There is no abdominal tenderness. There is no right CVA tenderness, left CVA tenderness, guarding or rebound.     Hernia: No hernia is present.  Genitourinary:    Labia:        Left: Rash and lesion present.     Musculoskeletal:        General: No swelling, tenderness, deformity or signs of injury. Normal range of motion.     Cervical back: Full passive range of motion without pain, normal range of motion and neck supple. No edema, rigidity or tenderness. No muscular tenderness.      Right lower leg: No edema.     Left lower leg: No edema.  Lymphadenopathy:     Cervical: No cervical adenopathy.     Right cervical: No superficial, deep or posterior cervical adenopathy.    Left cervical: No superficial, deep or posterior cervical adenopathy.  Skin:    General: Skin is warm and dry.     Capillary Refill: Capillary refill takes less than 2 seconds.     Coloration: Skin is not jaundiced or pale.     Findings: No bruising, erythema, lesion or rash.  Neurological:     General: No focal deficit present.     Mental Status: She is alert and oriented to person, place, and time. Mental status is at baseline.     GCS: GCS eye subscore is 4. GCS verbal subscore is 5. GCS motor subscore is 6.     Sensory: Sensation is intact. No sensory deficit.  Motor: Motor function is intact. No weakness.     Coordination: Coordination is intact. Coordination normal.     Gait: Gait is intact. Gait normal.  Psychiatric:        Attention and Perception: Attention and perception normal.        Mood and Affect: Mood and affect normal.        Speech: Speech normal.        Behavior: Behavior normal. Behavior is cooperative.        Thought Content: Thought content normal.        Cognition and Memory: Cognition and memory normal.        Judgment: Judgment normal.     Last depression screening scores    02/04/2023    9:17 AM 01/06/2023    1:14 PM 12/11/2022    1:56 PM  PHQ 2/9 Scores  PHQ - 2 Score 1 2 1   PHQ- 9 Score 7 8 11    Last fall risk screening    02/04/2023    9:17 AM  Fall Risk   Falls in the past year? 1  Number falls in past yr: 1  Injury with Fall? 0   Last Audit-C alcohol use screening    02/04/2023    9:17 AM  Alcohol Use Disorder Test (AUDIT)  1. How often do you have a drink containing alcohol? 0  3. How often do you have six or more drinks on one occasion? 0   A score of 3 or more in women, and 4 or more in men indicates increased risk for alcohol abuse, EXCEPT  if all of the points are from question 1   No results found for any visits on 02/04/23.  Assessment & Plan    Routine Health Maintenance and Physical Exam  Exercise Activities and Dietary recommendations  Goals      DIET - EAT MORE FRUITS AND VEGETABLES     Exercise 150 minutes per week (moderate activity)     10/06/17- Pt to increase the amount of exercise done. Pt to start walking for 3-4 days a week for at least 30 minutes at a time.         Immunization History  Administered Date(s) Administered   Influenza, Seasonal, Injecte, Preservative Fre 06/24/2015   Influenza,inj,Quad PF,6+ Mos 10/04/2016, 06/02/2017, 06/04/2018, 06/08/2019, 06/19/2021   Moderna Sars-Covid-2 Vaccination 02/01/2020, 03/02/2020, 09/21/2020   Zoster Recombinat (Shingrix) 06/08/2019    Health Maintenance  Topic Date Due   DTaP/Tdap/Td (1 - Tdap) Never done   Zoster Vaccines- Shingrix (2 of 2) 08/03/2019   COVID-19 Vaccine (4 - 2023-24 season) 05/24/2022   INFLUENZA VACCINE  04/24/2023   COLONOSCOPY (Pts 45-35yrs Insurance coverage will need to be confirmed)  08/17/2023   Medicare Annual Wellness (AWV)  01/06/2024   MAMMOGRAM  07/25/2024   PAP SMEAR-Modifier  01/24/2025   Hepatitis C Screening  Completed   HIV Screening  Completed   HPV VACCINES  Aged Out    Discussed health benefits of physical activity, and encouraged her to engage in regular exercise appropriate for her age and condition.  Problem List Items Addressed This Visit       Musculoskeletal and Integument   Atrophic lichen planus    Acute; not improved with topical antifungals alone Recommend combo antifungal/steroid to assist RTC as needed      Relevant Medications   nystatin-triamcinolone ointment (MYCOLOG)   Nail fungus    R great toe; referral to podiatry to assist Previously noted on  L great toe with improvement s/p nail avulsion       Relevant Medications   nystatin-triamcinolone ointment (MYCOLOG)   Other  Relevant Orders   Ambulatory referral to Podiatry     Other   Annual physical exam - Primary    Due for mammo and colonoscopy later this fall UTD on dental and vision Things to do to keep yourself healthy  - Exercise at least 30-45 minutes a day, 3-4 days a week.  - Eat a low-fat diet with lots of fruits and vegetables, up to 7-9 servings per day.  - Seatbelts can save your life. Wear them always.  - Smoke detectors on every level of your home, check batteries every year.  - Eye Doctor - have an eye exam every 1-2 years  - Safe sex - if you may be exposed to STDs, use a condom.  - Alcohol -  If you drink, do it moderately, less than 2 drinks per day.  - Health Care Power of Attorney. Choose someone to speak for you if you are not able.  - Depression is common in our stressful world.If you're feeling down or losing interest in things you normally enjoy, please come in for a visit.  - Violence - If anyone is threatening or hurting you, please call immediately.       Relevant Orders   Basic Metabolic Panel (BMET)   CBC   Lipid panel   Avitaminosis D    Chronic, previously on supplementation Repeat labs in setting of moderate, recurrent depression       Relevant Orders   Vitamin D (25 hydroxy)   Depression, recurrent (HCC)    Chronic, stable PHQ reviewed Declines use of zoloft to assist; previously prescribed 200 mg daily which patient has weaned herself off of Continue to monitor for exacerbations of s/s       Elevated glucose    Noted on previous labs; repeat A1c Continue to recommend balanced, lower carb meals. Smaller meal size, adding snacks. Choosing water as drink of choice and increasing purposeful exercise.       Relevant Orders   Hemoglobin A1c   Elevated LDL cholesterol level    Chronic, untreated Repeat LP recommend diet low in saturated fat and regular exercise - 30 min at least 5 times per week  The 10-year ASCVD risk score (Arnett DK, et al., 2019) is:  3.3%   Values used to calculate the score:     Age: 65 years     Sex: Female     Is Non-Hispanic African American: No     Diabetic: No     Tobacco smoker: No     Systolic Blood Pressure: 122 mmHg     Is BP treated: No     HDL Cholesterol: 63 mg/dL     Total Cholesterol: 193 mg/dL       Relevant Orders   Lipid panel   GAD (generalized anxiety disorder)    Chronic, currently exacerbated at night only Request to increase gaba from 200 to 300 mg Continue to monitor s/s       RLS (restless legs syndrome)    Chronic, currently exacerbated at night only Request to increase gaba from 200 to 300 mg; previously trial of requip- non adherent  Continue to monitor s/s       Screen for colon cancer    10 year follow up due this fall       Relevant Orders   Ambulatory referral to  Gastroenterology   Screening mammogram for breast cancer    Due this fall Due for screening for mammogram, denies breast concerns, provided with phone number to call and schedule appointment for mammogram. Encouraged to repeat breast cancer screening every 1-2 years.       Relevant Orders   MM 3D SCREENING MAMMOGRAM BILATERAL BREAST   Return in about 6 months (around 08/07/2023) for chonic disease management.    Leilani Merl, FNP, have reviewed all documentation for this visit. The documentation on 02/04/23 for the exam, diagnosis, procedures, and orders are all accurate and complete.  Jacky Kindle, FNP  [facility] [phone] (phone) [phone] (fax)  [facility]

## 2023-02-05 LAB — BASIC METABOLIC PANEL
BUN/Creatinine Ratio: 26 (ref 12–28)
BUN: 18 mg/dL (ref 8–27)
CO2: 21 mmol/L (ref 20–29)
Calcium: 9.3 mg/dL (ref 8.7–10.3)
Chloride: 104 mmol/L (ref 96–106)
Creatinine, Ser: 0.69 mg/dL (ref 0.57–1.00)
Glucose: 86 mg/dL (ref 70–99)
Potassium: 3.4 mmol/L — ABNORMAL LOW (ref 3.5–5.2)
Sodium: 144 mmol/L (ref 134–144)
eGFR: 98 mL/min/{1.73_m2} (ref >59)

## 2023-02-05 LAB — CBC
Hematocrit: 39.6 % (ref 34.0–46.6)
Hemoglobin: 13.4 g/dL (ref 11.1–15.9)
MCH: 30.2 pg (ref 26.6–33.0)
MCHC: 33.8 g/dL (ref 31.5–35.7)
MCV: 89 fL (ref 79–97)
Platelets: 233 10*3/uL (ref 150–450)
RBC: 4.44 x10E6/uL (ref 3.77–5.28)
RDW: 12.3 % (ref 11.7–15.4)
WBC: 6.7 10*3/uL (ref 3.4–10.8)

## 2023-02-05 LAB — HEMOGLOBIN A1C
Est. average glucose Bld gHb Est-mCnc: 91 mg/dL
Hgb A1c MFr Bld: 4.8 % (ref 4.8–5.6)

## 2023-02-05 LAB — LIPID PANEL
Chol/HDL Ratio: 3.5 ratio (ref 0.0–4.4)
Cholesterol, Total: 173 mg/dL (ref 100–199)
HDL: 50 mg/dL (ref >39)
LDL Chol Calc (NIH): 99 mg/dL (ref 0–99)
Triglycerides: 139 mg/dL (ref 0–149)
VLDL Cholesterol Cal: 24 mg/dL (ref 5–40)

## 2023-02-05 LAB — VITAMIN D 25 HYDROXY (VIT D DEFICIENCY, FRACTURES): Vit D, 25-Hydroxy: 9.1 ng/mL — ABNORMAL LOW (ref 30.0–100.0)

## 2023-02-05 NOTE — Progress Notes (Signed)
Borderline low potassium.  Lower Vit D; continue to recommend supplementation.   LDL is improved; The 10-year ASCVD risk score (Arnett DK, et al., 2019) is: 3.5% I continue to recommend diet low in saturated fat and regular exercise - 30 min at least 5 times per week  Other labs are normal/stable.

## 2023-02-12 ENCOUNTER — Encounter: Payer: Self-pay | Admitting: *Deleted

## 2023-02-28 ENCOUNTER — Other Ambulatory Visit: Payer: Self-pay | Admitting: Family Medicine

## 2023-02-28 NOTE — Telephone Encounter (Signed)
Requested Prescriptions  Pending Prescriptions Disp Refills   SUMAtriptan (IMITREX) 100 MG tablet [Pharmacy Med Name: SUMATRIPTAN SUCC 100 MG TABLET] 9 tablet 2    Sig: TAKE 1 TAB BY MOUTH ONCE AT HEADACHE ONSET, MAY REPEAT AFTER 2 HRS, MAX 2 TABS/DAY, 4 TABS/WEEK     Neurology:  Migraine Therapy - Triptan Passed - 02/28/2023  2:28 AM      Passed - Last BP in normal range    BP Readings from Last 1 Encounters:  02/04/23 122/78         Passed - Valid encounter within last 12 months    Recent Outpatient Visits           3 weeks ago Annual physical exam   [facility] Merita Norton T, FNP   2 months ago Herpes zoster with other complication   [facility] Jacky Kindle, FNP   4 months ago Facial mass   [facility] Merita Norton T, FNP   7 months ago Inversion of left nipple   [facility] Jacky Kindle, FNP   1 year ago Annual physical exam   [facility] Jacky Kindle, FNP

## 2023-03-05 ENCOUNTER — Other Ambulatory Visit: Payer: Self-pay | Admitting: Family Medicine

## 2023-03-05 DIAGNOSIS — R632 Polyphagia: Secondary | ICD-10-CM

## 2023-03-05 MED ORDER — LISDEXAMFETAMINE DIMESYLATE 50 MG PO CAPS: 50.0000 mg | ORAL_CAPSULE | Freq: Every day | ORAL | 0 refills | Status: DC

## 2023-04-04 ENCOUNTER — Other Ambulatory Visit: Payer: Self-pay | Admitting: Family Medicine

## 2023-04-04 DIAGNOSIS — R632 Polyphagia: Secondary | ICD-10-CM

## 2023-04-04 MED ORDER — LISDEXAMFETAMINE DIMESYLATE 50 MG PO CAPS: 50.0000 mg | ORAL_CAPSULE | Freq: Every day | ORAL | 0 refills | Status: DC

## 2023-04-24 ENCOUNTER — Other Ambulatory Visit: Payer: Self-pay | Admitting: Family Medicine

## 2023-04-24 DIAGNOSIS — G2581 Restless legs syndrome: Secondary | ICD-10-CM

## 2023-04-24 DIAGNOSIS — F411 Generalized anxiety disorder: Secondary | ICD-10-CM

## 2023-04-24 DIAGNOSIS — M792 Neuralgia and neuritis, unspecified: Secondary | ICD-10-CM

## 2023-04-25 NOTE — Telephone Encounter (Signed)
Requested Prescriptions  Pending Prescriptions Disp Refills   traZODone (DESYREL) 50 MG tablet [Pharmacy Med Name: TRAZODONE 50 MG TABLET] 180 tablet 1    Sig: TAKE 1-2 TABLETS (50-100 MG TOTAL) BY MOUTH AT BEDTIME AS NEEDED.     Psychiatry: Antidepressants - Serotonin Modulator Passed - 04/24/2023  2:31 PM      Passed - Completed PHQ-2 or PHQ-9 in the last 360 days      Passed - Valid encounter within last 6 months    Recent Outpatient Visits           2 months ago Annual physical exam   [facility] Merita Norton T, FNP   4 months ago Herpes zoster with other complication   [facility] Jacky Kindle, FNP   6 months ago Facial mass   [facility] Mer[facility] Norton T, FNP   9 months ago Inversion of left nipple   [facility] Jacky Kindle, FNP   1 year ago Annual physical exam   [facility] Merita Norton T, FNP               rOPINIRole (REQUIP) 0.25 MG tablet [Pharmacy Med Name: ROPINIROLE HCL 0.25 MG TABLET] 90 tablet 0    Sig: TAKE 1 TABLET BY MOUTH EVERYDAY AT BEDTIME     Neurology:  Parkinsonian Agents Passed - 04/24/2023  2:31 PM      Passed - Last BP in normal range    BP Readings from Last 1 Encounters:  02/04/23 122/78         Passed - Last Heart Rate in normal range    Pulse Readings from Last 1 Encounters:  02/04/23 79         Passed - Valid encounter within last 12 months    Recent Outpatient Visits           2 months ago Annual physical exam   [facility] Merita Norton T, FNP   4 months ago Herpes zoster with other complication   [facility] Jacky Kindle, FNP   6 months ago Facial mass   [facility] Merita Norton T, FNP   9 months ago Inversion of left nipple   [facility] Jacky Kindle, FNP   1 year ago Annual physical  exam   [facility] Jacky Kindle, FNP

## 2023-05-06 ENCOUNTER — Other Ambulatory Visit: Payer: Self-pay | Admitting: Family Medicine

## 2023-05-06 DIAGNOSIS — R632 Polyphagia: Secondary | ICD-10-CM

## 2023-05-07 MED ORDER — LISDEXAMFETAMINE DIMESYLATE 50 MG PO CAPS: 50.0000 mg | ORAL_CAPSULE | Freq: Every day | ORAL | 0 refills | Status: DC

## 2023-05-28 ENCOUNTER — Other Ambulatory Visit: Payer: Self-pay | Admitting: Family Medicine

## 2023-06-09 ENCOUNTER — Other Ambulatory Visit: Payer: Self-pay | Admitting: Family Medicine

## 2023-06-09 DIAGNOSIS — R632 Polyphagia: Secondary | ICD-10-CM

## 2023-06-09 MED ORDER — LISDEXAMFETAMINE DIMESYLATE 50 MG PO CAPS: 50.0000 mg | ORAL_CAPSULE | Freq: Every day | ORAL | 0 refills | Status: DC

## 2023-07-01 ENCOUNTER — Other Ambulatory Visit: Payer: Self-pay | Admitting: Family Medicine

## 2023-07-01 DIAGNOSIS — G2581 Restless legs syndrome: Secondary | ICD-10-CM

## 2023-07-01 DIAGNOSIS — M792 Neuralgia and neuritis, unspecified: Secondary | ICD-10-CM

## 2023-07-01 MED ORDER — ROPINIROLE HCL 0.25 MG PO TABS: 0.2500 mg | ORAL_TABLET | Freq: Every day | ORAL | 0 refills | Status: DC

## 2023-07-09 ENCOUNTER — Other Ambulatory Visit: Payer: Self-pay | Admitting: Family Medicine

## 2023-07-09 DIAGNOSIS — R632 Polyphagia: Secondary | ICD-10-CM

## 2023-07-10 MED ORDER — LISDEXAMFETAMINE DIMESYLATE 50 MG PO CAPS
50.0000 mg | ORAL_CAPSULE | Freq: Every day | ORAL | 0 refills | Status: DC
Start: 2023-07-10 — End: 2023-08-11

## 2023-08-11 ENCOUNTER — Other Ambulatory Visit: Payer: Self-pay | Admitting: Family Medicine

## 2023-08-11 DIAGNOSIS — R632 Polyphagia: Secondary | ICD-10-CM

## 2023-08-12 ENCOUNTER — Other Ambulatory Visit: Payer: Self-pay | Admitting: Family Medicine

## 2023-08-13 MED ORDER — LISDEXAMFETAMINE DIMESYLATE 50 MG PO CAPS
50.0000 mg | ORAL_CAPSULE | Freq: Every day | ORAL | 0 refills | Status: DC
Start: 2023-08-13 — End: 2023-09-26

## 2023-09-02 ENCOUNTER — Other Ambulatory Visit: Payer: Self-pay | Admitting: Family Medicine

## 2023-09-02 NOTE — Telephone Encounter (Signed)
Please advise 

## 2023-09-11 ENCOUNTER — Other Ambulatory Visit: Payer: Self-pay | Admitting: Family Medicine

## 2023-09-11 DIAGNOSIS — M792 Neuralgia and neuritis, unspecified: Secondary | ICD-10-CM

## 2023-09-11 DIAGNOSIS — G2581 Restless legs syndrome: Secondary | ICD-10-CM

## 2023-09-14 ENCOUNTER — Other Ambulatory Visit: Payer: Self-pay | Admitting: Family Medicine

## 2023-09-14 DIAGNOSIS — G2581 Restless legs syndrome: Secondary | ICD-10-CM

## 2023-09-14 DIAGNOSIS — M792 Neuralgia and neuritis, unspecified: Secondary | ICD-10-CM

## 2023-09-18 MED ORDER — ROPINIROLE HCL 0.25 MG PO TABS
0.2500 mg | ORAL_TABLET | Freq: Every day | ORAL | 0 refills | Status: DC
Start: 2023-09-18 — End: 2023-09-26

## 2023-09-19 ENCOUNTER — Ambulatory Visit: Payer: Medicare HMO | Admitting: Family Medicine

## 2023-09-26 ENCOUNTER — Encounter: Payer: Self-pay | Admitting: Family Medicine

## 2023-09-26 ENCOUNTER — Ambulatory Visit (INDEPENDENT_AMBULATORY_CARE_PROVIDER_SITE_OTHER): Payer: Medicare HMO | Admitting: Family Medicine

## 2023-09-26 VITALS — BP 126/82 | HR 90 | Ht 64.5 in | Wt 209.0 lb

## 2023-09-26 DIAGNOSIS — M545 Low back pain, unspecified: Secondary | ICD-10-CM

## 2023-09-26 DIAGNOSIS — R632 Polyphagia: Secondary | ICD-10-CM | POA: Diagnosis not present

## 2023-09-26 DIAGNOSIS — E559 Vitamin D deficiency, unspecified: Secondary | ICD-10-CM

## 2023-09-26 DIAGNOSIS — G2581 Restless legs syndrome: Secondary | ICD-10-CM | POA: Diagnosis not present

## 2023-09-26 DIAGNOSIS — G43109 Migraine with aura, not intractable, without status migrainosus: Secondary | ICD-10-CM

## 2023-09-26 DIAGNOSIS — E66812 Obesity, class 2: Secondary | ICD-10-CM

## 2023-09-26 DIAGNOSIS — L439 Lichen planus, unspecified: Secondary | ICD-10-CM

## 2023-09-26 DIAGNOSIS — R030 Elevated blood-pressure reading, without diagnosis of hypertension: Secondary | ICD-10-CM | POA: Diagnosis not present

## 2023-09-26 DIAGNOSIS — F339 Major depressive disorder, recurrent, unspecified: Secondary | ICD-10-CM

## 2023-09-26 DIAGNOSIS — F419 Anxiety disorder, unspecified: Secondary | ICD-10-CM

## 2023-09-26 DIAGNOSIS — M792 Neuralgia and neuritis, unspecified: Secondary | ICD-10-CM

## 2023-09-26 DIAGNOSIS — Z6835 Body mass index (BMI) 35.0-35.9, adult: Secondary | ICD-10-CM

## 2023-09-26 DIAGNOSIS — B372 Candidiasis of skin and nail: Secondary | ICD-10-CM

## 2023-09-26 DIAGNOSIS — L708 Other acne: Secondary | ICD-10-CM

## 2023-09-26 DIAGNOSIS — F410 Panic disorder [episodic paroxysmal anxiety] without agoraphobia: Secondary | ICD-10-CM

## 2023-09-26 DIAGNOSIS — G8929 Other chronic pain: Secondary | ICD-10-CM

## 2023-09-26 MED ORDER — ROPINIROLE HCL 0.25 MG PO TABS: 0.2500 mg | ORAL_TABLET | Freq: Every day | ORAL | 0 refills | Status: DC

## 2023-09-26 MED ORDER — SERTRALINE HCL 100 MG PO TABS
200.0000 mg | ORAL_TABLET | Freq: Every day | ORAL | 3 refills | Status: DC
Start: 2023-09-26 — End: 2023-10-21

## 2023-09-26 MED ORDER — SUMATRIPTAN SUCCINATE 100 MG PO TABS: ORAL_TABLET | ORAL | 2 refills | Status: DC

## 2023-09-26 MED ORDER — HYDROXYZINE HCL 10 MG PO TABS: 10.0000 mg | ORAL_TABLET | Freq: Three times a day (TID) | ORAL | 1 refills | Status: DC | PRN

## 2023-09-26 MED ORDER — GABAPENTIN 300 MG PO CAPS: 300.0000 mg | ORAL_CAPSULE | Freq: Every day | ORAL | 5 refills | Status: DC

## 2023-09-26 MED ORDER — MELOXICAM 7.5 MG PO TABS: 7.5000 mg | ORAL_TABLET | Freq: Every day | ORAL | 0 refills | Status: DC

## 2023-09-26 MED ORDER — NYSTATIN-TRIAMCINOLONE 100000-0.1 UNIT/GM-% EX OINT: 1.0000 | TOPICAL_OINTMENT | Freq: Two times a day (BID) | CUTANEOUS | 0 refills | Status: DC

## 2023-09-26 MED ORDER — LISDEXAMFETAMINE DIMESYLATE 50 MG PO CAPS: 50.0000 mg | ORAL_CAPSULE | Freq: Every day | ORAL | 0 refills | Status: DC

## 2023-09-26 MED ORDER — ALPRAZOLAM 0.25 MG PO TABS: 0.2500 mg | ORAL_TABLET | Freq: Every day | ORAL | 0 refills | Status: DC | PRN

## 2023-09-26 NOTE — Progress Notes (Signed)
 Established Patient Office Visit  Introduced to nurse practitioner role and practice setting.  All questions answered.  Discussed provider/patient relationship and expectations.   Subjective   Patient ID: Krystal Simpson, female    DOB: December 05, 1960  Age: 63 y.o. MRN: [id number]  Chief Complaint  Patient presents with   Follow-up    Medication refill Pt stated-Back pain-cause by anxiety and might need pain clinic.    The 63 year old patient presents for medication refill and expresses concerns about escalating anxiety and chronic back pain. The patient reports a history of anxiety attacks and is currently taking Sertraline  200 mg daily for depression.[name]  Anxiety - pt having increased panic attacks and anxiety related to back pain. She is beside herself, it have lead to deterioration in general life and lack of leaving home.   Request refills on: sumatriptan  for migraines, Requip  for her restless legs, and gabapentin  for her nerve pain in neck.  The patient's primary concern is chronic lower back pain, which has been unresponsive to over-the-counter pain relievers and cortisone shots. They report that nerve burning treatments have also been ineffective. The pain has significantly impacted the patient's quality of life, leading to increased anxiety, depression, lack of exercise, and weight gain.  Skin breakouts: believes related to heightened anxiety. She has been using a dandruff shampoo on face,  no improvement. The patient has a history of seeing a dermatologist for skin issues.  Vyvanse  for Binge Eating Disorder (BED), but expresses concerns about recent weight gain due to pain and mental health. The patient has a history of seeing a psychologist but is not currently in therapy.          09/26/2023    3:00 PM 02/04/2023    9:17 AM 01/06/2023    1:14 PM  Depression screen PHQ 2/9  Decreased Interest 2 1 1   Down, Depressed, Hopeless 2 0 1  PHQ - 2 Score 4 1 2   Altered sleeping 2 3 1    Tired, decreased energy 2 3 3   Change in appetite 2 0 1  Feeling bad or failure about yourself  2 0 1  Trouble concentrating 0 0 0  Moving slowly or fidgety/restless 0 0 0  Suicidal thoughts 0 0 0  PHQ-9 Score 12 7 8   Difficult doing work/chores Very difficult Not difficult at all Somewhat difficult       09/26/2023    3:01 PM  GAD 7 : Generalized Anxiety Score  Nervous, Anxious, on Edge 3  Control/stop worrying 2  Worry too much - different things 1  Trouble relaxing 2  Restless 0  Easily annoyed or irritable 2  Afraid - awful might happen 0  Total GAD 7 Score 10  Anxiety Difficulty Very difficult     Review of Systems  Musculoskeletal:  Positive for back pain and neck pain.  Psychiatric/Behavioral:  Positive for depression. Negative for suicidal ideas. The patient is nervous/anxious.   All other systems reviewed and are negative.   Negative unless indicated in HPI   Objective:     BP 126/82 (BP Location: Right Arm, Patient Position: Sitting, Cuff Size: Large)   Pulse 90   Ht 5' 4.5 (1.638 m)   Wt 209 lb (94.8 kg)   BMI 35.32 kg/m    Physical Exam Constitutional:      General: She is in acute distress.     Appearance: Normal appearance. She is obese.     Comments: Actively crying  HENT:  Head: Normocephalic.     Mouth/Throat:     Mouth: Mucous membranes are moist.     Pharynx: Oropharynx is clear.  Eyes:     Extraocular Movements: Extraocular movements intact.     Conjunctiva/sclera: Conjunctivae normal.     Pupils: Pupils are equal, round, and reactive to light.  Neck:     Comments: Chronic neck pain, Lidocaine patch on neck Cardiovascular:     Rate and Rhythm: Normal rate and regular rhythm.     Pulses: Normal pulses.     Heart sounds: Normal heart sounds. No murmur heard.    No friction rub. No gallop.  Pulmonary:     Effort: Pulmonary effort is normal. No respiratory distress.     Breath sounds: Normal breath sounds. No stridor. No wheezing,  rhonchi or rales.  Chest:     Chest wall: No tenderness.  Musculoskeletal:     Cervical back: Tenderness present. No rigidity.     Right lower leg: No edema.     Left lower leg: No edema.  Lymphadenopathy:     Cervical: No cervical adenopathy.  Skin:    General: Skin is warm and dry.     Capillary Refill: Capillary refill takes less than 2 seconds.     Findings: Acne and erythema present.     Comments: Facial acne and erythema on cheeks, nose, and chin  Neurological:     General: No focal deficit present.     Mental Status: She is alert and oriented to person, place, and time. Mental status is at baseline.  Psychiatric:        Attention and Perception: Attention normal.        Mood and Affect: Mood is anxious. Affect is tearful.        Speech: Speech normal.        Behavior: Behavior normal. Behavior is cooperative.        Thought Content: Thought content normal.        Cognition and Memory: Cognition and memory normal.        Judgment: Judgment normal.    Results for orders placed or performed in visit on 09/26/23  Comprehensive metabolic panel  Result Value Ref Range   Glucose 93 70 - 99 mg/dL   BUN 14 8 - 27 mg/dL   Creatinine, Ser 9.26 0.57 - 1.00 mg/dL   eGFR 92 >40 fO/fpw/8.26   BUN/Creatinine Ratio 19 12 - 28   Sodium 143 134 - 144 mmol/L   Potassium 3.9 3.5 - 5.2 mmol/L   Chloride 105 96 - 106 mmol/L   CO2 23 20 - 29 mmol/L   Calcium  9.3 8.7 - 10.3 mg/dL   Total Protein 6.7 6.0 - 8.5 g/dL   Albumin 4.6 3.9 - 4.9 g/dL   Globulin, Total 2.1 1.5 - 4.5 g/dL   Bilirubin Total 0.5 0.0 - 1.2 mg/dL   Alkaline Phosphatase 127 (H) 44 - 121 IU/L   AST 17 0 - 40 IU/L   ALT 14 0 - 32 IU/L  HgB A1c  Result Value Ref Range   Hgb A1c MFr Bld 4.9 4.8 - 5.6 %   Est. average glucose Bld gHb Est-mCnc 94 mg/dL  TSH + free T4  Result Value Ref Range   TSH 0.729 0.450 - 4.500 uIU/mL   Free T4 1.55 0.82 - 1.77 ng/dL  Vitamin D  (25 hydroxy)  Result Value Ref Range   Vit D,  25-Hydroxy 10.5 (L) 30.0 - 100.0 ng/mL  Lipid  Profile  Result Value Ref Range   Cholesterol, Total 236 (H) 100 - 199 mg/dL   Triglycerides 851 0 - 149 mg/dL   HDL 54 >60 mg/dL   VLDL Cholesterol Cal 27 5 - 40 mg/dL   LDL Chol Calc (NIH) 844 (H) 0 - 99 mg/dL   Chol/HDL Ratio 4.4 0.0 - 4.4 ratio      The 10-year ASCVD risk score (Arnett DK, et al., 2019) is: 4.9%    Assessment & Plan:  Class 2 obesity with body mass index (BMI) of 35.0 to 35.9 in adult, unspecified obesity type, unspecified whether serious comorbidity present Assessment & Plan: Increased weight gain since previous visit, 189lb (may 2024) to today 209lb - even while on binge eating med vyvanse  - Will continue vyvanse  for now, but need to reassess and discontinue at next visit - Weight loss discussed, will hold on medications today given mental health stress - Encourage balanced diet, exercise - Lipid and A1C  Orders: -     Hemoglobin A1c -     Lipid panel  Binge eating Assessment & Plan: Chronic - Weight gain noted Refilled vyvanse  today, but consider discontinuing at next visit given pt's continued weight gain. Continue to recommend balanced, lower carb meals. Smaller meal size, adding snacks. Choosing water as drink of choice and increasing purposeful exercise    Orders: -     Lisdexamfetamine Dimesylate ; Take 1 capsule (50 mg total) by mouth daily.  Dispense: 30 capsule; Refill: 0  RLS (restless legs syndrome) Assessment & Plan: Chronic, stable - Continue nightly requip   Orders: -     rOPINIRole  HCl; Take 1 tablet (0.25 mg total) by mouth at bedtime.  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  Dispense: 30 capsule; Refill: 5 -     rOPINIRole  HCl; Take 1 tablet (0.25 mg total) by mouth at bedtime.  Dispense: 90 tablet; Refill:  0  Elevated blood pressure reading in office without diagnosis of hypertension Assessment & Plan: Goal SBP<120l DBP<80 - Exercise, weight loss, diet, low sodium - monitor at home - acute distress today - CMP   Orders: -     Comprehensive metabolic panel  Avitaminosis D -     VITAMIN D  25 Hydroxy (Vit-D Deficiency, Fractures)  Panic attack due to exceptional stress Assessment & Plan: Acute, several recent episodes - as needed xanax  0.25mg  daily for acute stress today given pt's tearful and high anxious state today - as needed hydroxyzine  for future attacks.  - decline referral to psych [facility], but has done CBT in past  Orders: -     ALPRAZolam ; Take 1 tablet (0.25 mg total) by mouth daily as needed for up to 10 doses for anxiety.  Dispense: 10 tablet; Refill: 0  Anxiety Assessment & Plan: Heightened state of anxiety during today's visit - Continue daily Sertraline  200mg  for anxiety and depression - as needed hydroxyzine  added today -  Decline referral to psychiatry today. - will check TSH  Orders: -     TSH + free T4 -     ALPRAZolam ; Take 1 tablet (0.25 mg total) by mouth daily as needed for up to 10 doses for anxiety.  Dispense: 10 tablet; Refill: 0 -     hydrOXYzine  HCl; Take 1 tablet (10 mg total) by mouth 3 (three)  times daily as needed.  Dispense: 30 tablet; Refill: 1  Chronic bilateral low back pain, unspecified whether sciatica present Assessment & Plan: History of falls, MRI  hx shows chronic L1 fracture, disc extrusion, and lumbar hypertrophy.  Previous treatments including NSAIDs, cortisone shots, and nerve burning by ortho have not provided relief. - heat and ice area - back stretches, weight loss, and exercise -Start Meloxicam  for pain management. -Refer to pain management clinic. -Recommend patient follow up with orthopedics. -Refer to physical therapy.  Orders: -     Ambulatory referral to Pain Clinic -     Meloxicam ; Take 1 tablet (7.5 mg total) by  mouth daily.  Dispense: 30 tablet; Refill: 0 -     Ambulatory referral to Physical Therapy  Depression, recurrent (HCC) Assessment & Plan: Chronic Pt restarted sertraline  200mg  daily herself - Will reorder as it is helping pt - must be consistent with medication for proper tx - declines referral to psych today.  Orders: -     Sertraline  HCl; Take 2 tablets (200 mg total) by mouth daily.  Dispense: 60 tablet; Refill: 3  Migraine with aura and without status migrainosus, not intractable Assessment & Plan: Chronic, stable Currently managed with Sumatriptan  100mg  as needed -Continue current regimen. -Previously seen by neurology  Orders: -     SUMAtriptan  Succinate; TAKE 1 TAB BY MOUTH ONCE AT HEADACHE ONSET, MAY REPEAT AFTER 2 HRS, MAX 2 TABS/DAY, 4 TABS/WEEK  Dispense: 9 tablet; Refill: 2  Skin yeast infection Assessment & Plan: Due to pannus fold, increase moisture.  Nystatin -traimcinolone ordered to apply to affected area - Keep skin clean and dry - weight loss - unscented soaps and detergents  Orders: -     Nystatin -Triamcinolone ; Apply 1 Application topically 2 (two) times daily.  Dispense: 30 g; Refill: 0  Other acne Assessment & Plan: Increase skin breakouts, appears on assessment rosacea  Facial cleansing with Cerave or Cetaphil Moisturize with gentle moisturizers - pt has dermatologist, she plans to schedule with them for further eval.       Will communicate labs.  Return in about 6 weeks (around 11/07/2023).   I, Curtis DELENA Boom, FNP, have reviewed all documentation for this visit. The documentation on 09/27/23 for the exam, diagnosis, procedures, and orders are all accurate and complete.   Curtis DELENA Boom, FNP

## 2023-09-27 ENCOUNTER — Encounter: Payer: Self-pay | Admitting: Family Medicine

## 2023-09-27 DIAGNOSIS — L708 Other acne: Secondary | ICD-10-CM | POA: Insufficient documentation

## 2023-09-27 DIAGNOSIS — F410 Panic disorder [episodic paroxysmal anxiety] without agoraphobia: Secondary | ICD-10-CM | POA: Insufficient documentation

## 2023-09-27 DIAGNOSIS — F430 Acute stress reaction: Secondary | ICD-10-CM | POA: Insufficient documentation

## 2023-09-27 DIAGNOSIS — R030 Elevated blood-pressure reading, without diagnosis of hypertension: Secondary | ICD-10-CM | POA: Insufficient documentation

## 2023-09-27 LAB — HEMOGLOBIN A1C
Est. average glucose Bld gHb Est-mCnc: 94 mg/dL
Hgb A1c MFr Bld: 4.9 % (ref 4.8–5.6)

## 2023-09-27 LAB — COMPREHENSIVE METABOLIC PANEL
ALT: 14 [IU]/L (ref 0–32)
AST: 17 [IU]/L (ref 0–40)
Albumin: 4.6 g/dL (ref 3.9–4.9)
Alkaline Phosphatase: 127 [IU]/L — ABNORMAL HIGH (ref 44–121)
BUN/Creatinine Ratio: 19 (ref 12–28)
BUN: 14 mg/dL (ref 8–27)
Bilirubin Total: 0.5 mg/dL (ref 0.0–1.2)
CO2: 23 mmol/L (ref 20–29)
Calcium: 9.3 mg/dL (ref 8.7–10.3)
Chloride: 105 mmol/L (ref 96–106)
Creatinine, Ser: 0.73 mg/dL (ref 0.57–1.00)
Globulin, Total: 2.1 g/dL (ref 1.5–4.5)
Glucose: 93 mg/dL (ref 70–99)
Potassium: 3.9 mmol/L (ref 3.5–5.2)
Sodium: 143 mmol/L (ref 134–144)
Total Protein: 6.7 g/dL (ref 6.0–8.5)
eGFR: 92 mL/min/{1.73_m2} (ref >59)

## 2023-09-27 LAB — TSH+FREE T4
Free T4: 1.55 ng/dL (ref 0.82–1.77)
TSH: 0.729 u[IU]/mL (ref 0.450–4.500)

## 2023-09-27 LAB — LIPID PANEL
Chol/HDL Ratio: 4.4 {ratio} (ref 0.0–4.4)
Cholesterol, Total: 236 mg/dL — ABNORMAL HIGH (ref 100–199)
HDL: 54 mg/dL (ref >39)
LDL Chol Calc (NIH): 155 mg/dL — ABNORMAL HIGH (ref 0–99)
Triglycerides: 148 mg/dL (ref 0–149)
VLDL Cholesterol Cal: 27 mg/dL (ref 5–40)

## 2023-09-27 LAB — VITAMIN D 25 HYDROXY (VIT D DEFICIENCY, FRACTURES): Vit D, 25-Hydroxy: 10.5 ng/mL — ABNORMAL LOW (ref 30.0–100.0)

## 2023-09-27 NOTE — Assessment & Plan Note (Addendum)
 Chronic, stable Currently managed with Sumatriptan 100mg  as needed -Continue current regimen. -Previously seen by neurology

## 2023-09-27 NOTE — Assessment & Plan Note (Signed)
 Increased weight gain since previous visit, 189lb (may 2024) to today 209lb - even while on binge eating med vyvanse  - Will continue vyvanse  for now, but need to reassess and discontinue at next visit - Weight loss discussed, will hold on medications today given mental health stress - Encourage balanced diet, exercise - Lipid and A1C

## 2023-09-27 NOTE — Assessment & Plan Note (Signed)
 Chronic, stable - Continue nightly requip

## 2023-09-27 NOTE — Assessment & Plan Note (Signed)
 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

## 2023-09-27 NOTE — Assessment & Plan Note (Signed)
 Increase skin breakouts, appears on assessment rosacea  Facial cleansing with Cerave or Cetaphil Moisturize with gentle moisturizers - pt has dermatologist, she plans to schedule with them for further eval.

## 2023-09-27 NOTE — Assessment & Plan Note (Addendum)
 Goal SBP<120l DBP<80 - Exercise, weight loss, diet, low sodium - monitor at home - acute distress today - CMP

## 2023-09-27 NOTE — Assessment & Plan Note (Signed)
 Due to pannus fold, increase moisture.  Nystatin-traimcinolone ordered to apply to affected area - Keep skin clean and dry - weight loss - unscented soaps and detergents

## 2023-09-27 NOTE — Assessment & Plan Note (Addendum)
 Heightened state of anxiety during today's visit - Continue daily Sertraline 200mg  for anxiety and depression - as needed hydroxyzine added today -  Decline referral to psychiatry today. - will check TSH

## 2023-09-27 NOTE — Assessment & Plan Note (Signed)
 History of falls, MRI  hx shows chronic L1 fracture, disc extrusion, and lumbar hypertrophy.  Previous treatments including NSAIDs, cortisone shots, and nerve burning by ortho have not provided relief. - heat and ice area - back stretches, weight loss, and exercise -Start Meloxicam  for pain management. -Refer to pain management clinic. -Recommend patient follow up with orthopedics. -Refer to physical therapy.

## 2023-09-27 NOTE — Assessment & Plan Note (Signed)
 Acute, several recent episodes - as needed xanax 0.25mg  daily for acute stress today given pt's tearful and high anxious state today - as needed hydroxyzine for future attacks.  - decline referral to psych [facility], but has done CBT in past

## 2023-09-27 NOTE — Assessment & Plan Note (Signed)
 Chronic - Weight gain noted Refilled vyvanse  today, but consider discontinuing at next visit given pt's continued weight gain. Continue to recommend balanced, lower carb meals. Smaller meal size, adding snacks. Choosing water as drink of choice and increasing purposeful exercise

## 2023-09-27 NOTE — Assessment & Plan Note (Signed)
 Chronic Pt restarted sertraline 200mg  daily herself - Will reorder as it is helping pt - must be consistent with medication for proper tx - declines referral to psych today.

## 2023-10-20 ENCOUNTER — Other Ambulatory Visit: Payer: Self-pay | Admitting: Family Medicine

## 2023-10-20 DIAGNOSIS — F339 Major depressive disorder, recurrent, unspecified: Secondary | ICD-10-CM

## 2023-10-25 ENCOUNTER — Other Ambulatory Visit: Payer: Self-pay | Admitting: Family Medicine

## 2023-10-25 DIAGNOSIS — G8929 Other chronic pain: Secondary | ICD-10-CM

## 2023-10-28 ENCOUNTER — Other Ambulatory Visit: Payer: Self-pay | Admitting: Family Medicine

## 2023-10-28 DIAGNOSIS — F410 Panic disorder [episodic paroxysmal anxiety] without agoraphobia: Secondary | ICD-10-CM

## 2023-10-28 DIAGNOSIS — R632 Polyphagia: Secondary | ICD-10-CM

## 2023-10-28 DIAGNOSIS — F419 Anxiety disorder, unspecified: Secondary | ICD-10-CM

## 2023-11-07 ENCOUNTER — Ambulatory Visit: Payer: Medicare HMO | Admitting: Family Medicine

## 2023-11-07 ENCOUNTER — Encounter: Payer: Self-pay | Admitting: Family Medicine

## 2023-11-07 VITALS — BP 123/66 | HR 88 | Ht 64.0 in | Wt 212.8 lb

## 2023-11-07 DIAGNOSIS — M545 Low back pain, unspecified: Secondary | ICD-10-CM

## 2023-11-07 DIAGNOSIS — G8929 Other chronic pain: Secondary | ICD-10-CM

## 2023-11-07 DIAGNOSIS — G2581 Restless legs syndrome: Secondary | ICD-10-CM

## 2023-11-07 DIAGNOSIS — F339 Major depressive disorder, recurrent, unspecified: Secondary | ICD-10-CM

## 2023-11-07 DIAGNOSIS — R632 Polyphagia: Secondary | ICD-10-CM

## 2023-11-07 DIAGNOSIS — E7800 Pure hypercholesterolemia, unspecified: Secondary | ICD-10-CM

## 2023-11-07 DIAGNOSIS — F411 Generalized anxiety disorder: Secondary | ICD-10-CM

## 2023-11-07 MED ORDER — HYDROXYZINE PAMOATE 25 MG PO CAPS: 25.0000 mg | ORAL_CAPSULE | Freq: Three times a day (TID) | ORAL | 3 refills | Status: AC | PRN

## 2023-11-07 MED ORDER — LISDEXAMFETAMINE DIMESYLATE 50 MG PO CAPS: 50.0000 mg | ORAL_CAPSULE | Freq: Every day | ORAL | 0 refills | Status: DC

## 2023-11-07 MED ORDER — MELOXICAM 7.5 MG PO TABS
7.5000 mg | ORAL_TABLET | Freq: Every day | ORAL | 2 refills | Status: AC
Start: 2023-11-07 — End: ?

## 2023-11-07 MED ORDER — ROPINIROLE HCL 0.25 MG PO TABS: 0.2500 mg | ORAL_TABLET | Freq: Every day | ORAL | 1 refills | Status: DC

## 2023-11-07 NOTE — Progress Notes (Signed)
Established Patient Office Visit  Introduced to nurse practitioner role and practice setting.  All questions answered.  Discussed provider/patient relationship and expectations.  Subjective   Patient ID: Krystal Simpson, female    DOB: 30-Apr-1961  Age: 63 y.o. MRN: [id number]  Chief Complaint  Patient presents with   Follow-up    6 wk follow up for anxiety.    Krystal Simpson is a 63 year old female with anxiety and chronic back pain who presents for follow-up.  She has chronic back pain and has been taking meloxicam, which has led to some improvement in her symptoms. Despite past treatments, including cortisone shots and nerve burning, she continues to experience recurring pain - sees ortho, has a lot of anxiety for appointments, afraid of disappointment.  She experiences significant anxiety, particularly about attending appointments, stating 'it took all I could do to get here today.' She is currently taking hydroxyzine for anxiety, which she feels helps, and is interested in increasing the dose. She is also on Zoloft at 200 mg daily.  She is taking Vyvanse for binge eating, which helps her limit the amount she eats, particularly sugary foods. She has been on Vyvanse for about ten years and acknowledges the need to make better dietary choices, especially regarding sugar intake.  She experiences restless leg syndrome and is taking Requip for this condition.        11/07/2023   10:55 AM 09/26/2023    3:00 PM 02/04/2023    9:17 AM  Depression screen PHQ 2/9  Decreased Interest 2 2 1   Down, Depressed, Hopeless 1 2 0  PHQ - 2 Score 3 4 1   Altered sleeping 3 2 3   Tired, decreased energy 2 2 3   Change in appetite 3 2 0  Feeling bad or failure about yourself  1 2 0  Trouble concentrating 0 0 0  Moving slowly or fidgety/restless 0 0 0  Suicidal thoughts 0 0 0  PHQ-9 Score 12 12 7   Difficult doing work/chores Somewhat difficult Very difficult Not difficult at all       11/07/2023    10:56 AM 09/26/2023    3:01 PM  GAD 7 : Generalized Anxiety Score  Nervous, Anxious, on Edge 2 3  Control/stop worrying 2 2  Worry too much - different things 1 1  Trouble relaxing 1 2  Restless 0 0  Easily annoyed or irritable 1 2  Afraid - awful might happen 0 0  Total GAD 7 Score 7 10  Anxiety Difficulty Somewhat difficult Very difficult     Review of Systems  All other systems reviewed and are negative.   Negative unless indicated in HPI   Objective:     BP 123/66   Pulse 88   Ht 5\' 4"  (1.626 m)   Wt 212 lb 12.8 oz (96.5 kg)   SpO2 96%   BMI 36.53 kg/m    Physical Exam Constitutional:      General: She is not in acute distress.    Appearance: Normal appearance. She is obese. She is not toxic-appearing or diaphoretic.  HENT:     Head: Normocephalic.     Nose: Nose normal.     Mouth/Throat:     Mouth: Mucous membranes are moist.     Pharynx: Oropharynx is clear.  Eyes:     Extraocular Movements: Extraocular movements intact.     Pupils: Pupils are equal, round, and reactive to light.  Cardiovascular:     Rate and  Rhythm: Normal rate and regular rhythm.     Pulses: Normal pulses.     Heart sounds: Normal heart sounds. No murmur heard.    No friction rub. No gallop.  Pulmonary:     Effort: No respiratory distress.     Breath sounds: No stridor. No wheezing, rhonchi or rales.  Chest:     Chest wall: No tenderness.  Musculoskeletal:     Right lower leg: No edema.     Left lower leg: No edema.  Skin:    General: Skin is warm and dry.     Capillary Refill: Capillary refill takes less than 2 seconds.  Neurological:     General: No focal deficit present.     Mental Status: She is alert and oriented to person, place, and time. Mental status is at baseline.  Psychiatric:        Mood and Affect: Mood normal.        Behavior: Behavior normal.        Thought Content: Thought content normal.        Judgment: Judgment normal.    No results found for any visits  on 11/07/23.    The 10-year ASCVD risk score (Arnett DK, et al., 2019) is: 4.7%    Assessment & Plan:  GAD (generalized anxiety disorder) Assessment & Plan: Chronic, improved previous visit No recent panic attacks Increased anxiety around medical appointments.  Currently on Zoloft 200mg  and hydroxyzine 10mg  as needed.  Discussed increasing hydroxyzine or potentially adding Buspar. -Increase hydroxyzine to 25mg  as needed. -Reassess anxiety management at next visit. -GAD7=7   Orders: -     hydrOXYzine Pamoate; Take 1 capsule (25 mg total) by mouth every 8 (eight) hours as needed.  Dispense: 30 capsule; Refill: 3  Chronic bilateral low back pain, unspecified whether sciatica present Assessment & Plan: Improvement in back pain with meloxicam.  No current plans for further intervention with back specialist- plans to reschedule appt. -Continue meloxicam as needed. -CMP UTD -BP controlled -Refill meloxicam prescription.  Orders: -     Meloxicam; Take 1 tablet (7.5 mg total) by mouth daily.  Dispense: 30 tablet; Refill: 2  Depression, recurrent (HCC) Assessment & Plan: Chronic Continue Sertraline 200mg  daily Declines referral to behavioral health today   Binge eating Assessment & Plan: Chronic Long term use of Vyvanse 50mg  for binge eating Discussed benefits and risks of continuing med, given stimulant, age and anxiety Pt wishes to continue - states helps her portion control Appears to have gained weight from previous visit Will reassess at next visit - discussed to decrease  Orders: -     Lisdexamfetamine Dimesylate; Take 1 capsule (50 mg total) by mouth daily.  Dispense: 30 capsule; Refill: 0 -     Lisdexamfetamine Dimesylate; Take 1 capsule (50 mg total) by mouth daily.  Dispense: 30 capsule; Refill: 0 -     Lisdexamfetamine Dimesylate; Take 1 capsule (50 mg total) by mouth daily.  Dispense: 30 capsule; Refill: 0 -     Lisdexamfetamine Dimesylate; Take 1 capsule (50  mg total) by mouth daily.  Dispense: 30 capsule; Refill: 0  RLS (restless legs syndrome) Assessment & Plan: Chronic, stable - Continue nightly requip- reordered    Orders: -     rOPINIRole HCl; Take 1 tablet (0.25 mg total) by mouth at bedtime.  Dispense: 90 tablet; Refill: 1  Elevated LDL cholesterol level Assessment & Plan: Chronic Diet managed Continue to make healthy food choices and exercise Recheck in 6 months  The 10-year ASCVD risk score (Arnett DK, et al., 2019) is: 4.7%   Values used to calculate the score:     Age: 54 years     Sex: Female     Is Non-Hispanic African American: No     Diabetic: No     Tobacco smoker: No     Systolic Blood Pressure: 123 mmHg     Is BP treated: No     HDL Cholesterol: 54 mg/dL     Total Cholesterol: 236 mg/dL    Not due for labs today.   Return in about 6 months (around 05/06/2024).   I, Sallee Provencal, FNP, have reviewed all documentation for this visit. The documentation on 11/07/23 for the exam, diagnosis, procedures, and orders are all accurate and complete.   Sallee Provencal, FNP

## 2023-11-07 NOTE — Assessment & Plan Note (Signed)
Chronic, stable - Continue nightly requip- reordered

## 2023-11-07 NOTE — Assessment & Plan Note (Signed)
Chronic, improved previous visit No recent panic attacks Increased anxiety around medical appointments.  Currently on Zoloft 200mg  and hydroxyzine 10mg  as needed.  Discussed increasing hydroxyzine or potentially adding Buspar. -Increase hydroxyzine to 25mg  as needed. -Reassess anxiety management at next visit. -GAD7=7

## 2023-11-07 NOTE — Assessment & Plan Note (Signed)
Improvement in back pain with meloxicam.  No current plans for further intervention with back specialist- plans to reschedule appt. -Continue meloxicam as needed. -CMP UTD -BP controlled -Refill meloxicam prescription.

## 2023-11-07 NOTE — Assessment & Plan Note (Signed)
Chronic Long term use of Vyvanse 50mg  for binge eating Discussed benefits and risks of continuing med, given stimulant, age and anxiety Pt wishes to continue - states helps her portion control Appears to have gained weight from previous visit Will reassess at next visit - discussed to decrease

## 2023-11-07 NOTE — Assessment & Plan Note (Signed)
Chronic Diet managed Continue to make healthy food choices and exercise Recheck in 6 months  The 10-year ASCVD risk score (Arnett DK, et al., 2019) is: 4.7%   Values used to calculate the score:     Age: 63 years     Sex: Female     Is Non-Hispanic African American: No     Diabetic: No     Tobacco smoker: No     Systolic Blood Pressure: 123 mmHg     Is BP treated: No     HDL Cholesterol: 54 mg/dL     Total Cholesterol: 236 mg/dL

## 2023-11-07 NOTE — Assessment & Plan Note (Signed)
Chronic Continue Sertraline 200mg  daily Declines referral to behavioral health today

## 2023-11-07 NOTE — Assessment & Plan Note (Deleted)
Improvement in back pain with meloxicam.  No current plans for further intervention with back specialist- plans to reschedule appt. -Continue meloxicam as needed. -CMP UTD -BP controlled -Refill meloxicam prescription.

## 2023-11-29 ENCOUNTER — Other Ambulatory Visit: Payer: Self-pay | Admitting: Family Medicine

## 2023-11-29 DIAGNOSIS — G43109 Migraine with aura, not intractable, without status migrainosus: Secondary | ICD-10-CM

## 2023-12-19 ENCOUNTER — Other Ambulatory Visit: Payer: Self-pay | Admitting: Family Medicine

## 2023-12-19 DIAGNOSIS — R632 Polyphagia: Secondary | ICD-10-CM

## 2023-12-24 ENCOUNTER — Other Ambulatory Visit: Payer: Self-pay | Admitting: Family Medicine

## 2023-12-24 DIAGNOSIS — R632 Polyphagia: Secondary | ICD-10-CM

## 2023-12-24 NOTE — Telephone Encounter (Unsigned)
 Copied from CRM [id number]. Topic: Clinical - Medication Refill >> Dec 24, 2023 10:17 AM Higinio Roger wrote: Most Recent Primary Care Visit:  Provider: Charlcie Cradle A  Department: [facility]  Visit Type: OFFICE VISIT  Date: 11/07/2023  Medication: lisdexamfetamine (VYVANSE) 50 MG capsule   Has the patient contacted their pharmacy? Yes (Agent: If no, request that the patient contact the pharmacy for the refill. If patient does not wish to contact the pharmacy document the reason why and proceed with request.) (Agent: If yes, when and what did the pharmacy advise?)  Is this the correct pharmacy for this prescription? Yes If no, delete pharmacy and type the correct one.  This is the patient's preferred pharmacy:   [id number] [address] [facility] - [address] [place] [address] Phone: [phone] Fax: [phone]  Has the prescription been filled recently? Yes  Is the patient out of the medication? Yes  Has the patient been seen for an appointment in the last year OR does the patient have an upcoming appointment? Yes  Can we respond through MyChart? Yes  Agent: Please be advised that Rx refills may take up to 3 business days. We ask that you follow-up with your pharmacy.

## 2023-12-25 NOTE — Telephone Encounter (Signed)
 Requested medication (s) are due for refill today:   Provider to review  Requested medication (s) are on the active medication list:   Yes  Future visit scheduled:   Yes 8/14 with Kellie    Last ordered: 12/05/2023 #30, 0 refills  Non delegated refill.    This is a duplicate request.      Requested Prescriptions  Pending Prescriptions Disp Refills   lisdexamfetamine (VYVANSE) 50 MG capsule 30 capsule 0    Sig: Take 1 capsule (50 mg total) by mouth daily.     Not Delegated - Psychiatry:  Stimulants/ADHD Failed - 12/25/2023  3:47 PM      Failed - This refill cannot be delegated      Failed - Urine Drug Screen completed in last 360 days      Passed - Last BP in normal range    BP Readings from Last 1 Encounters:  11/07/23 123/66         Passed - Last Heart Rate in normal range    Pulse Readings from Last 1 Encounters:  11/07/23 88         Passed - Valid encounter within last 6 months    Recent Outpatient Visits           1 month ago GAD (generalized anxiety disorder)   [facility], Jennette Kettle, FNP       Future Appointments             In 4 months Ephriam Knuckles, Jennette Kettle, FNP [facility], [address]

## 2023-12-26 MED ORDER — LISDEXAMFETAMINE DIMESYLATE 50 MG PO CAPS
50.0000 mg | ORAL_CAPSULE | Freq: Every day | ORAL | 0 refills | Status: DC
Start: 2023-12-26 — End: 2024-04-13

## 2024-02-06 ENCOUNTER — Other Ambulatory Visit: Payer: Self-pay | Admitting: Family Medicine

## 2024-02-06 DIAGNOSIS — G43109 Migraine with aura, not intractable, without status migrainosus: Secondary | ICD-10-CM

## 2024-03-15 ENCOUNTER — Other Ambulatory Visit: Payer: Self-pay | Admitting: Family Medicine

## 2024-03-15 DIAGNOSIS — B372 Candidiasis of skin and nail: Secondary | ICD-10-CM

## 2024-03-29 ENCOUNTER — Ambulatory Visit: Payer: Medicare HMO | Admitting: Family Medicine

## 2024-04-10 ENCOUNTER — Other Ambulatory Visit: Payer: Self-pay | Admitting: Family Medicine

## 2024-04-10 DIAGNOSIS — G43109 Migraine with aura, not intractable, without status migrainosus: Secondary | ICD-10-CM

## 2024-04-12 ENCOUNTER — Telehealth: Payer: Self-pay | Admitting: Family Medicine

## 2024-04-12 NOTE — Telephone Encounter (Signed)
 [facility] refill request for the following medications:  lisdexamfetamine (VYVANSE ) 50 MG capsule    Please advise.

## 2024-04-13 ENCOUNTER — Other Ambulatory Visit: Payer: Self-pay | Admitting: Family Medicine

## 2024-04-13 DIAGNOSIS — R632 Polyphagia: Secondary | ICD-10-CM

## 2024-04-13 MED ORDER — LISDEXAMFETAMINE DIMESYLATE 50 MG PO CAPS: 50.0000 mg | ORAL_CAPSULE | Freq: Every day | ORAL | 0 refills | Status: DC

## 2024-04-13 NOTE — Telephone Encounter (Signed)
 Called patient to inform her that a refill has been sent to her pharmacy.

## 2024-04-13 NOTE — Telephone Encounter (Signed)
 Filled

## 2024-04-16 ENCOUNTER — Other Ambulatory Visit: Payer: Self-pay | Admitting: Family Medicine

## 2024-04-16 ENCOUNTER — Encounter: Payer: Self-pay | Admitting: Family Medicine

## 2024-04-16 DIAGNOSIS — R632 Polyphagia: Secondary | ICD-10-CM

## 2024-04-16 MED ORDER — LISDEXAMFETAMINE DIMESYLATE 50 MG PO CAPS: 50.0000 mg | ORAL_CAPSULE | Freq: Every day | ORAL | 0 refills | Status: DC

## 2024-05-06 ENCOUNTER — Ambulatory Visit: Payer: Medicare HMO | Admitting: Family Medicine

## 2024-07-03 ENCOUNTER — Other Ambulatory Visit: Payer: Self-pay | Admitting: Family Medicine

## 2024-07-03 DIAGNOSIS — G2581 Restless legs syndrome: Secondary | ICD-10-CM

## 2024-07-06 ENCOUNTER — Other Ambulatory Visit: Payer: Self-pay | Admitting: Family Medicine

## 2024-07-06 ENCOUNTER — Encounter: Payer: Self-pay | Admitting: Family Medicine

## 2024-07-06 DIAGNOSIS — M5416 Radiculopathy, lumbar region: Secondary | ICD-10-CM

## 2024-07-07 ENCOUNTER — Ambulatory Visit
Admission: RE | Admit: 2024-07-07 | Discharge: 2024-07-07 | Disposition: A | Discharge status: Home / Self Care | Source: Ambulatory Visit | Attending: Family Medicine | Admitting: Family Medicine

## 2024-07-07 DIAGNOSIS — M5416 Radiculopathy, lumbar region: Secondary | ICD-10-CM

## 2024-07-08 ENCOUNTER — Other Ambulatory Visit: Payer: Self-pay

## 2024-07-08 ENCOUNTER — Encounter: Payer: Self-pay | Admitting: Family Medicine

## 2024-07-08 DIAGNOSIS — R632 Polyphagia: Secondary | ICD-10-CM

## 2024-07-08 MED ORDER — LISDEXAMFETAMINE DIMESYLATE 50 MG PO CAPS: 50.0000 mg | ORAL_CAPSULE | Freq: Every day | ORAL | 0 refills | Status: AC

## 2024-07-13 ENCOUNTER — Other Ambulatory Visit: Payer: Self-pay | Admitting: Family Medicine

## 2024-07-13 DIAGNOSIS — F339 Major depressive disorder, recurrent, unspecified: Secondary | ICD-10-CM

## 2024-07-18 ENCOUNTER — Other Ambulatory Visit: Payer: Self-pay | Admitting: Family Medicine

## 2024-07-18 DIAGNOSIS — G43109 Migraine with aura, not intractable, without status migrainosus: Secondary | ICD-10-CM

## 2024-07-19 NOTE — Telephone Encounter (Signed)
 LOV- 11/07/2023 NOV- None LRF- 04/12/2024  SUMAtriptan  (IMITREX ) 100 MG tablet [[id number]]   Order Details Dose, Route, Frequency: As Directed  Dispense Quantity: 9 tablet Refills: 2   Duration: -- Dispense As Written: No        Sig: TAKE 1 TAB BY MOUTH ONCE AT HEADACHE ONSET, MAY REPEAT AFTER 2 HRS, MAX 2 TABS/DAY, 4 TABS/WEEK       Start Date: 04/12/24 End Date: --  Written Date: 04/12/24 Expiration Date: 04/12/25

## 2024-07-25 ENCOUNTER — Other Ambulatory Visit: Payer: Self-pay | Admitting: Family Medicine

## 2024-07-25 DIAGNOSIS — M792 Neuralgia and neuritis, unspecified: Secondary | ICD-10-CM

## 2024-08-26 ENCOUNTER — Encounter: Admitting: Physician Assistant

## 2024-09-10 ENCOUNTER — Encounter: Payer: Self-pay | Admitting: Physician Assistant

## 2024-09-10 DIAGNOSIS — G43109 Migraine with aura, not intractable, without status migrainosus: Secondary | ICD-10-CM

## 2024-09-10 DIAGNOSIS — G2581 Restless legs syndrome: Secondary | ICD-10-CM

## 2024-09-12 MED ORDER — ROPINIROLE HCL 0.25 MG PO TABS: 0.2500 mg | ORAL_TABLET | Freq: Every day | ORAL | 0 refills | Status: AC

## 2024-09-12 MED ORDER — SUMATRIPTAN SUCCINATE 100 MG PO TABS: ORAL_TABLET | ORAL | 1 refills | Status: AC

## 2024-09-21 ENCOUNTER — Other Ambulatory Visit: Payer: Self-pay

## 2024-09-21 DIAGNOSIS — G2581 Restless legs syndrome: Secondary | ICD-10-CM

## 2024-09-21 DIAGNOSIS — R632 Polyphagia: Secondary | ICD-10-CM

## 2024-09-21 NOTE — Telephone Encounter (Signed)
 Copied from CRM #[id number]. Topic: Clinical - Medication Question >> Sep 21, 2024  3:14 PM Sophia H wrote: Reason for CRM: Spoke with patients husband who states patient is needing a refill on lisdexamfetamine  (VYVANSE ) 50 MG capsule. Sch appt to [facility] for January with PA Janna - please reach out and advise if medication can be filled.    [id number] [address] [place], [place] [phone]

## 2024-10-20 ENCOUNTER — Encounter: Admitting: Physician Assistant

## 2024-10-20 DIAGNOSIS — Z6835 Body mass index (BMI) 35.0-35.9, adult: Secondary | ICD-10-CM

## 2024-10-20 DIAGNOSIS — G2581 Restless legs syndrome: Secondary | ICD-10-CM

## 2024-10-20 DIAGNOSIS — E7800 Pure hypercholesterolemia, unspecified: Secondary | ICD-10-CM

## 2024-10-20 DIAGNOSIS — R632 Polyphagia: Secondary | ICD-10-CM

## 2024-10-20 DIAGNOSIS — G8929 Other chronic pain: Secondary | ICD-10-CM

## 2024-10-20 DIAGNOSIS — F339 Major depressive disorder, recurrent, unspecified: Secondary | ICD-10-CM

## 2024-10-20 DIAGNOSIS — E559 Vitamin D deficiency, unspecified: Secondary | ICD-10-CM

## 2024-10-20 DIAGNOSIS — F411 Generalized anxiety disorder: Secondary | ICD-10-CM

## 2024-10-20 DIAGNOSIS — F410 Panic disorder [episodic paroxysmal anxiety] without agoraphobia: Secondary | ICD-10-CM
